# Patient Record
Sex: Female | Born: 1960 | Race: White
Health system: Northeastern US, Academic
[De-identification: ages and names within clinical notes are randomized; demographics above are authoritative.]

## PROBLEM LIST (undated history)

## (undated) DIAGNOSIS — R569 Unspecified convulsions: Secondary | ICD-10-CM

## (undated) DIAGNOSIS — G4730 Sleep apnea, unspecified: Secondary | ICD-10-CM

## (undated) DIAGNOSIS — B20 Human immunodeficiency virus [HIV] disease: Secondary | ICD-10-CM

## (undated) DIAGNOSIS — G2581 Restless legs syndrome: Secondary | ICD-10-CM

## (undated) DIAGNOSIS — G629 Polyneuropathy, unspecified: Secondary | ICD-10-CM

## (undated) DIAGNOSIS — G47419 Narcolepsy without cataplexy: Secondary | ICD-10-CM

## (undated) DIAGNOSIS — E785 Hyperlipidemia, unspecified: Secondary | ICD-10-CM

## (undated) DIAGNOSIS — Z21 Asymptomatic human immunodeficiency virus [HIV] infection status: Secondary | ICD-10-CM

## (undated) DIAGNOSIS — I10 Essential (primary) hypertension: Secondary | ICD-10-CM

## (undated) DIAGNOSIS — F319 Bipolar disorder, unspecified: Secondary | ICD-10-CM

## (undated) HISTORY — DX: Human immunodeficiency virus (HIV) disease: B20

## (undated) HISTORY — DX: Asymptomatic human immunodeficiency virus (hiv) infection status: Z21

## (undated) HISTORY — DX: Bipolar disorder, unspecified: F31.9

## (undated) HISTORY — DX: Hyperlipidemia, unspecified: E78.5

## (undated) HISTORY — PX: SHOULDER SURGERY: SHX246

## (undated) HISTORY — DX: Essential (primary) hypertension: I10

---

## 1999-08-14 ENCOUNTER — Emergency Department (HOSPITAL_COMMUNITY): Admission: EM | Admit: 1999-08-14 | Discharge: 1999-08-14 | Payer: Self-pay | Admitting: *Deleted

## 2000-01-18 ENCOUNTER — Other Ambulatory Visit: Admission: RE | Admit: 2000-01-18 | Discharge: 2000-01-18 | Payer: Self-pay | Admitting: Family Medicine

## 2003-08-21 ENCOUNTER — Other Ambulatory Visit: Admission: RE | Admit: 2003-08-21 | Discharge: 2003-08-21 | Payer: Self-pay | Admitting: Family Medicine

## 2003-09-01 ENCOUNTER — Ambulatory Visit (HOSPITAL_BASED_OUTPATIENT_CLINIC_OR_DEPARTMENT_OTHER): Admission: RE | Admit: 2003-09-01 | Discharge: 2003-09-01 | Payer: Self-pay | Admitting: Family Medicine

## 2004-02-05 ENCOUNTER — Ambulatory Visit: Payer: Self-pay | Admitting: Family Medicine

## 2004-02-16 ENCOUNTER — Ambulatory Visit: Payer: Self-pay | Admitting: Family Medicine

## 2004-04-05 ENCOUNTER — Emergency Department (HOSPITAL_COMMUNITY): Admission: EM | Admit: 2004-04-05 | Discharge: 2004-04-05 | Payer: Self-pay | Admitting: Emergency Medicine

## 2004-07-28 ENCOUNTER — Ambulatory Visit: Payer: Self-pay | Admitting: Family Medicine

## 2005-06-16 ENCOUNTER — Ambulatory Visit: Payer: Self-pay | Admitting: Family Medicine

## 2005-07-20 ENCOUNTER — Ambulatory Visit: Payer: Self-pay | Admitting: Family Medicine

## 2005-12-21 ENCOUNTER — Ambulatory Visit: Payer: Self-pay | Admitting: Family Medicine

## 2006-05-25 ENCOUNTER — Encounter: Payer: Self-pay | Admitting: Family Medicine

## 2006-05-30 ENCOUNTER — Ambulatory Visit: Payer: Self-pay | Admitting: Family Medicine

## 2006-06-29 ENCOUNTER — Ambulatory Visit: Payer: Self-pay | Admitting: Family Medicine

## 2006-07-09 ENCOUNTER — Ambulatory Visit: Payer: Self-pay | Admitting: Family Medicine

## 2006-07-09 LAB — CONVERTED CEMR LAB
AST: 24 units/L (ref 0–37)
Albumin: 3.5 g/dL (ref 3.5–5.2)
Basophils Relative: 0.7 % (ref 0.0–1.0)
Bilirubin, Direct: 0.1 mg/dL (ref 0.0–0.3)
Chloride: 104 meq/L (ref 96–112)
Creatinine, Ser: 0.7 mg/dL (ref 0.4–1.2)
Eosinophils Absolute: 0.1 10*3/uL (ref 0.0–0.6)
Eosinophils Relative: 1.3 % (ref 0.0–5.0)
Folate: 8.6 ng/mL
Glucose, Bld: 105 mg/dL — ABNORMAL HIGH (ref 70–99)
HDL: 38.3 mg/dL — ABNORMAL LOW (ref >39.0)
Hemoglobin: 13.4 g/dL (ref 12.0–15.0)
Lymphocytes Relative: 22.4 % (ref 12.0–46.0)
MCV: 86.9 fL (ref 78.0–100.0)
Monocytes Absolute: 0.4 10*3/uL (ref 0.2–0.7)
Monocytes Relative: 9.4 % (ref 3.0–11.0)
Neutro Abs: 2.6 10*3/uL (ref 1.4–7.7)
Platelets: 185 10*3/uL (ref 150–400)
Potassium: 4 meq/L (ref 3.5–5.1)
Sodium: 137 meq/L (ref 135–145)
TSH: 2.36 microintl units/mL (ref 0.35–5.50)
Total Bilirubin: 0.7 mg/dL (ref 0.3–1.2)
Total Protein: 7.3 g/dL (ref 6.0–8.3)
Triglycerides: 109 mg/dL (ref 0–149)

## 2006-07-20 ENCOUNTER — Ambulatory Visit: Payer: Self-pay | Admitting: Family Medicine

## 2006-07-24 DIAGNOSIS — G2589 Other specified extrapyramidal and movement disorders: Secondary | ICD-10-CM | POA: Insufficient documentation

## 2006-07-24 DIAGNOSIS — J45909 Unspecified asthma, uncomplicated: Secondary | ICD-10-CM | POA: Insufficient documentation

## 2006-07-24 DIAGNOSIS — F329 Major depressive disorder, single episode, unspecified: Secondary | ICD-10-CM | POA: Insufficient documentation

## 2006-07-27 ENCOUNTER — Ambulatory Visit: Payer: Self-pay | Admitting: Family Medicine

## 2006-08-02 ENCOUNTER — Ambulatory Visit: Payer: Self-pay | Admitting: Family Medicine

## 2006-08-14 ENCOUNTER — Ambulatory Visit: Payer: Self-pay | Admitting: Family Medicine

## 2006-08-14 LAB — CONVERTED CEMR LAB
Basophils Relative: 0.3 % (ref 0.0–1.0)
Eosinophils Relative: 1.3 % (ref 0.0–5.0)
Monocytes Relative: 9.1 % (ref 3.0–11.0)
Neutro Abs: 1.9 10*3/uL (ref 1.4–7.7)
Platelets: 190 10*3/uL (ref 150–400)
RBC: 4.53 M/uL (ref 3.87–5.11)
RDW: 12.3 % (ref 11.5–14.6)
WBC: 3.2 10*3/uL — ABNORMAL LOW (ref 4.5–10.5)

## 2006-08-16 ENCOUNTER — Encounter: Payer: Self-pay | Admitting: Family Medicine

## 2006-09-06 ENCOUNTER — Encounter: Payer: Self-pay | Admitting: Family Medicine

## 2006-09-10 ENCOUNTER — Telehealth (INDEPENDENT_AMBULATORY_CARE_PROVIDER_SITE_OTHER): Payer: Self-pay | Admitting: *Deleted

## 2006-09-10 DIAGNOSIS — IMO0001 Reserved for inherently not codable concepts without codable children: Secondary | ICD-10-CM | POA: Insufficient documentation

## 2006-10-03 ENCOUNTER — Encounter: Payer: Self-pay | Admitting: Family Medicine

## 2006-10-12 ENCOUNTER — Telehealth: Payer: Self-pay | Admitting: Family Medicine

## 2006-10-16 ENCOUNTER — Ambulatory Visit: Payer: Self-pay | Admitting: Family Medicine

## 2006-10-19 LAB — CONVERTED CEMR LAB
Eosinophils Absolute: 0 10*3/uL (ref 0.0–0.6)
Eosinophils Relative: 0.6 % (ref 0.0–5.0)
HCT: 38.6 % (ref 36.0–46.0)
Hemoglobin: 13.5 g/dL (ref 12.0–15.0)
Lymphocytes Relative: 13.6 % (ref 12.0–46.0)
MCHC: 35 g/dL (ref 30.0–36.0)
MCV: 86.7 fL (ref 78.0–100.0)
Monocytes Absolute: 0.2 10*3/uL (ref 0.2–0.7)
Neutrophils Relative %: 82.5 % — ABNORMAL HIGH (ref 43.0–77.0)
RBC: 4.45 M/uL (ref 3.87–5.11)
WBC: 5.8 10*3/uL (ref 4.5–10.5)

## 2006-11-26 ENCOUNTER — Encounter: Payer: Self-pay | Admitting: Family Medicine

## 2006-12-05 ENCOUNTER — Telehealth (INDEPENDENT_AMBULATORY_CARE_PROVIDER_SITE_OTHER): Payer: Self-pay | Admitting: *Deleted

## 2006-12-13 ENCOUNTER — Encounter (INDEPENDENT_AMBULATORY_CARE_PROVIDER_SITE_OTHER): Payer: Self-pay | Admitting: *Deleted

## 2006-12-13 ENCOUNTER — Encounter: Payer: Self-pay | Admitting: Internal Medicine

## 2006-12-13 ENCOUNTER — Other Ambulatory Visit: Admission: RE | Admit: 2006-12-13 | Discharge: 2006-12-13 | Payer: Self-pay | Admitting: Family Medicine

## 2006-12-13 ENCOUNTER — Encounter: Payer: Self-pay | Admitting: Family Medicine

## 2006-12-13 ENCOUNTER — Ambulatory Visit: Payer: Self-pay | Admitting: Family Medicine

## 2006-12-13 DIAGNOSIS — Z87891 Personal history of nicotine dependence: Secondary | ICD-10-CM | POA: Insufficient documentation

## 2006-12-13 DIAGNOSIS — D518 Other vitamin B12 deficiency anemias: Secondary | ICD-10-CM | POA: Insufficient documentation

## 2006-12-13 LAB — CONVERTED CEMR LAB
Pap Smear: NORMAL
Pap Smear: NORMAL

## 2007-01-16 ENCOUNTER — Ambulatory Visit: Payer: Self-pay | Admitting: Family Medicine

## 2007-01-17 ENCOUNTER — Ambulatory Visit: Payer: Self-pay | Admitting: Family Medicine

## 2007-01-17 LAB — CONVERTED CEMR LAB
Basophils Absolute: 0 10*3/uL (ref 0.0–0.1)
MCHC: 34.4 g/dL (ref 30.0–36.0)
Monocytes Absolute: 0.3 10*3/uL (ref 0.2–0.7)
Monocytes Relative: 8 % (ref 3.0–11.0)
Platelets: 219 10*3/uL (ref 150–400)
RBC: 4.35 M/uL (ref 3.87–5.11)
RDW: 13.6 % (ref 11.5–14.6)
Vitamin B-12: 315 pg/mL (ref 211–911)

## 2007-01-24 DIAGNOSIS — R75 Inconclusive laboratory evidence of human immunodeficiency virus [HIV]: Secondary | ICD-10-CM | POA: Insufficient documentation

## 2007-01-25 ENCOUNTER — Telehealth (INDEPENDENT_AMBULATORY_CARE_PROVIDER_SITE_OTHER): Payer: Self-pay | Admitting: *Deleted

## 2007-01-25 LAB — CONVERTED CEMR LAB
HIV-1 antibody: POSITIVE — AB
HIV-2 Ab: UNDETERMINED — AB

## 2007-02-25 ENCOUNTER — Encounter: Payer: Self-pay | Admitting: Internal Medicine

## 2007-04-11 ENCOUNTER — Ambulatory Visit: Payer: Self-pay | Admitting: Internal Medicine

## 2007-04-11 ENCOUNTER — Encounter: Admission: RE | Admit: 2007-04-11 | Discharge: 2007-04-11 | Payer: Self-pay | Admitting: Internal Medicine

## 2007-04-11 DIAGNOSIS — B20 Human immunodeficiency virus [HIV] disease: Secondary | ICD-10-CM | POA: Insufficient documentation

## 2007-04-11 LAB — CONVERTED CEMR LAB
AST: 18 units/L (ref 0–37)
BUN: 10 mg/dL (ref 6–23)
Basophils Relative: 1 % (ref 0–1)
CO2: 24 meq/L (ref 19–32)
Calcium: 8.8 mg/dL (ref 8.4–10.5)
Chlamydia, Swab/Urine, PCR: NEGATIVE
Chloride: 104 meq/L (ref 96–112)
Cholesterol: 209 mg/dL — ABNORMAL HIGH (ref 0–200)
Creatinine, Ser: 0.74 mg/dL (ref 0.40–1.20)
HCV Ab: NEGATIVE
HDL: 48 mg/dL (ref >39)
HIV-1 antibody: POSITIVE — AB
HIV-2 Ab: UNDETERMINED — AB
HIV: REACTIVE
Hemoglobin, Urine: NEGATIVE
Hemoglobin: 14.4 g/dL (ref 12.0–15.0)
Ketones, ur: NEGATIVE mg/dL
MCHC: 33.8 g/dL (ref 30.0–36.0)
Monocytes Absolute: 0.3 10*3/uL (ref 0.1–1.0)
Monocytes Relative: 7 % (ref 3–12)
Neutro Abs: 3.4 10*3/uL (ref 1.7–7.7)
Nitrite: NEGATIVE
RBC: 4.77 M/uL (ref 3.87–5.11)
Total CHOL/HDL Ratio: 4.4
Triglycerides: 90 mg/dL (ref <150)
Urobilinogen, UA: 0.2 (ref 0.0–1.0)

## 2007-04-12 ENCOUNTER — Encounter: Payer: Self-pay | Admitting: Internal Medicine

## 2007-04-19 DIAGNOSIS — M779 Enthesopathy, unspecified: Secondary | ICD-10-CM | POA: Insufficient documentation

## 2007-04-26 ENCOUNTER — Ambulatory Visit: Payer: Self-pay | Admitting: Internal Medicine

## 2007-04-26 ENCOUNTER — Encounter: Admission: RE | Admit: 2007-04-26 | Discharge: 2007-04-26 | Payer: Self-pay | Admitting: Internal Medicine

## 2007-04-26 ENCOUNTER — Encounter: Payer: Self-pay | Admitting: Licensed Clinical Social Worker

## 2007-05-08 ENCOUNTER — Encounter (INDEPENDENT_AMBULATORY_CARE_PROVIDER_SITE_OTHER): Payer: Self-pay | Admitting: *Deleted

## 2007-05-10 ENCOUNTER — Ambulatory Visit: Payer: Self-pay | Admitting: Internal Medicine

## 2007-05-20 ENCOUNTER — Encounter (INDEPENDENT_AMBULATORY_CARE_PROVIDER_SITE_OTHER): Payer: Self-pay | Admitting: *Deleted

## 2007-05-29 ENCOUNTER — Telehealth: Payer: Self-pay | Admitting: Internal Medicine

## 2007-06-12 ENCOUNTER — Encounter: Admission: RE | Admit: 2007-06-12 | Discharge: 2007-06-12 | Payer: Self-pay | Admitting: Internal Medicine

## 2007-06-12 ENCOUNTER — Ambulatory Visit: Payer: Self-pay | Admitting: Internal Medicine

## 2007-06-12 LAB — CONVERTED CEMR LAB
ALT: 13 units/L (ref 0–35)
BUN: 12 mg/dL (ref 6–23)
Basophils Absolute: 0 10*3/uL (ref 0.0–0.1)
CO2: 23 meq/L (ref 19–32)
Calcium: 8.7 mg/dL (ref 8.4–10.5)
Creatinine, Ser: 0.72 mg/dL (ref 0.40–1.20)
Eosinophils Relative: 1 % (ref 0–5)
HCT: 39.7 % (ref 36.0–46.0)
HIV 1 RNA Quant: 85 copies/mL — ABNORMAL HIGH (ref <50)
HIV-1 RNA Quant, Log: 1.93 — ABNORMAL HIGH (ref <1.70)
Hemoglobin: 13.2 g/dL (ref 12.0–15.0)
Lymphocytes Relative: 18 % (ref 12–46)
MCHC: 33.2 g/dL (ref 30.0–36.0)
Monocytes Absolute: 0.3 10*3/uL (ref 0.1–1.0)
Monocytes Relative: 5 % (ref 3–12)
RBC: 4.44 M/uL (ref 3.87–5.11)
RDW: 13 % (ref 11.5–15.5)
Total Bilirubin: 2.3 mg/dL — ABNORMAL HIGH (ref 0.3–1.2)

## 2007-06-25 ENCOUNTER — Telehealth: Payer: Self-pay | Admitting: Internal Medicine

## 2007-06-28 ENCOUNTER — Ambulatory Visit: Payer: Self-pay | Admitting: Internal Medicine

## 2007-06-28 DIAGNOSIS — D4959 Neoplasm of unspecified behavior of other genitourinary organ: Secondary | ICD-10-CM | POA: Insufficient documentation

## 2007-07-17 ENCOUNTER — Ambulatory Visit (HOSPITAL_COMMUNITY): Admission: RE | Admit: 2007-07-17 | Discharge: 2007-07-17 | Payer: Self-pay | Admitting: Internal Medicine

## 2007-07-17 ENCOUNTER — Encounter: Payer: Self-pay | Admitting: Internal Medicine

## 2007-07-19 ENCOUNTER — Encounter: Payer: Self-pay | Admitting: Family Medicine

## 2007-07-19 ENCOUNTER — Telehealth (INDEPENDENT_AMBULATORY_CARE_PROVIDER_SITE_OTHER): Payer: Self-pay | Admitting: *Deleted

## 2007-07-22 ENCOUNTER — Encounter: Payer: Self-pay | Admitting: Internal Medicine

## 2007-07-31 ENCOUNTER — Encounter: Admission: RE | Admit: 2007-07-31 | Discharge: 2007-07-31 | Payer: Self-pay | Admitting: Internal Medicine

## 2007-07-31 ENCOUNTER — Ambulatory Visit: Payer: Self-pay | Admitting: Internal Medicine

## 2007-07-31 LAB — CONVERTED CEMR LAB
AST: 29 units/L (ref 0–37)
Albumin: 4 g/dL (ref 3.5–5.2)
BUN: 12 mg/dL (ref 6–23)
CO2: 24 meq/L (ref 19–32)
Calcium: 8.8 mg/dL (ref 8.4–10.5)
Chloride: 102 meq/L (ref 96–112)
Creatinine, Ser: 0.84 mg/dL (ref 0.40–1.20)
Glucose, Bld: 77 mg/dL (ref 70–99)
HIV 1 RNA Quant: <50 copies/mL (ref <50)
Hemoglobin: 13.5 g/dL (ref 12.0–15.0)
Lymphocytes Relative: 21 % (ref 12–46)
Lymphs Abs: 1 10*3/uL (ref 0.7–4.0)
MCHC: 35.2 g/dL (ref 30.0–36.0)
Monocytes Absolute: 0.3 10*3/uL (ref 0.1–1.0)
Monocytes Relative: 7 % (ref 3–12)
Neutro Abs: 3.5 10*3/uL (ref 1.7–7.7)
Neutrophils Relative %: 71 % (ref 43–77)
Potassium: 4.1 meq/L (ref 3.5–5.3)
RBC: 4.35 M/uL (ref 3.87–5.11)
WBC: 4.9 10*3/uL (ref 4.0–10.5)

## 2007-08-14 ENCOUNTER — Ambulatory Visit: Payer: Self-pay | Admitting: Internal Medicine

## 2007-08-14 DIAGNOSIS — R030 Elevated blood-pressure reading, without diagnosis of hypertension: Secondary | ICD-10-CM | POA: Insufficient documentation

## 2007-08-15 ENCOUNTER — Encounter: Payer: Self-pay | Admitting: Licensed Clinical Social Worker

## 2007-08-21 ENCOUNTER — Ambulatory Visit: Payer: Self-pay | Admitting: *Deleted

## 2007-08-22 ENCOUNTER — Telehealth (INDEPENDENT_AMBULATORY_CARE_PROVIDER_SITE_OTHER): Payer: Self-pay | Admitting: *Deleted

## 2007-09-04 ENCOUNTER — Encounter (INDEPENDENT_AMBULATORY_CARE_PROVIDER_SITE_OTHER): Payer: Self-pay | Admitting: *Deleted

## 2007-09-04 ENCOUNTER — Encounter: Payer: Self-pay | Admitting: Obstetrics and Gynecology

## 2007-09-04 ENCOUNTER — Ambulatory Visit: Payer: Self-pay | Admitting: Obstetrics and Gynecology

## 2007-09-23 ENCOUNTER — Telehealth (INDEPENDENT_AMBULATORY_CARE_PROVIDER_SITE_OTHER): Payer: Self-pay | Admitting: *Deleted

## 2007-09-24 ENCOUNTER — Encounter: Payer: Self-pay | Admitting: Internal Medicine

## 2007-10-22 ENCOUNTER — Telehealth (INDEPENDENT_AMBULATORY_CARE_PROVIDER_SITE_OTHER): Payer: Self-pay | Admitting: *Deleted

## 2007-11-13 ENCOUNTER — Ambulatory Visit: Payer: Self-pay | Admitting: Internal Medicine

## 2007-11-13 ENCOUNTER — Encounter: Admission: RE | Admit: 2007-11-13 | Discharge: 2007-11-13 | Payer: Self-pay | Admitting: Internal Medicine

## 2007-11-14 LAB — CONVERTED CEMR LAB
AST: 25 units/L (ref 0–37)
Albumin: 4.6 g/dL (ref 3.5–5.2)
Alkaline Phosphatase: 111 units/L (ref 39–117)
Basophils Relative: 1 % (ref 0–1)
Calcium: 9.8 mg/dL (ref 8.4–10.5)
Chloride: 106 meq/L (ref 96–112)
Eosinophils Absolute: 0.1 10*3/uL (ref 0.0–0.7)
Lymphs Abs: 1 10*3/uL (ref 0.7–4.0)
MCHC: 33.7 g/dL (ref 30.0–36.0)
MCV: 95.9 fL (ref 78.0–100.0)
Monocytes Relative: 9 % (ref 3–12)
Neutro Abs: 3.6 10*3/uL (ref 1.7–7.7)
Neutrophils Relative %: 70 % (ref 43–77)
Platelets: 219 10*3/uL (ref 150–400)
Potassium: 5.7 meq/L — ABNORMAL HIGH (ref 3.5–5.3)
RBC: 4.58 M/uL (ref 3.87–5.11)
Sodium: 140 meq/L (ref 135–145)
Total Protein: 8 g/dL (ref 6.0–8.3)
WBC: 5.2 10*3/uL (ref 4.0–10.5)

## 2007-11-19 ENCOUNTER — Telehealth (INDEPENDENT_AMBULATORY_CARE_PROVIDER_SITE_OTHER): Payer: Self-pay | Admitting: *Deleted

## 2007-11-22 ENCOUNTER — Encounter (INDEPENDENT_AMBULATORY_CARE_PROVIDER_SITE_OTHER): Payer: Self-pay | Admitting: *Deleted

## 2007-11-22 ENCOUNTER — Telehealth (INDEPENDENT_AMBULATORY_CARE_PROVIDER_SITE_OTHER): Payer: Self-pay | Admitting: *Deleted

## 2007-11-22 ENCOUNTER — Ambulatory Visit: Payer: Self-pay | Admitting: Internal Medicine

## 2007-11-22 LAB — CONVERTED CEMR LAB
BUN: 13 mg/dL (ref 6–23)
CO2: 26 meq/L (ref 19–32)
Calcium: 9.5 mg/dL (ref 8.4–10.5)
Chloride: 106 meq/L (ref 96–112)
Creatinine, Ser: 0.75 mg/dL (ref 0.40–1.20)
Glucose, Bld: 106 mg/dL — ABNORMAL HIGH (ref 70–99)

## 2007-11-27 ENCOUNTER — Ambulatory Visit: Payer: Self-pay | Admitting: Internal Medicine

## 2007-12-17 ENCOUNTER — Telehealth (INDEPENDENT_AMBULATORY_CARE_PROVIDER_SITE_OTHER): Payer: Self-pay | Admitting: *Deleted

## 2007-12-27 ENCOUNTER — Ambulatory Visit: Payer: Self-pay | Admitting: Infectious Diseases

## 2007-12-27 ENCOUNTER — Ambulatory Visit: Payer: Self-pay | Admitting: Internal Medicine

## 2007-12-27 DIAGNOSIS — K047 Periapical abscess without sinus: Secondary | ICD-10-CM | POA: Insufficient documentation

## 2008-01-13 ENCOUNTER — Emergency Department (HOSPITAL_COMMUNITY): Admission: EM | Admit: 2008-01-13 | Discharge: 2008-01-13 | Payer: Self-pay | Admitting: Emergency Medicine

## 2008-03-06 ENCOUNTER — Ambulatory Visit: Payer: Self-pay | Admitting: Internal Medicine

## 2008-03-06 LAB — CONVERTED CEMR LAB
ALT: 16 units/L (ref 0–35)
AST: 22 units/L (ref 0–37)
Basophils Absolute: 0.1 10*3/uL (ref 0.0–0.1)
Basophils Relative: 2 % — ABNORMAL HIGH (ref 0–1)
Calcium: 9.6 mg/dL (ref 8.4–10.5)
Chloride: 104 meq/L (ref 96–112)
Creatinine, Ser: 0.87 mg/dL (ref 0.40–1.20)
MCHC: 32.8 g/dL (ref 30.0–36.0)
Monocytes Absolute: 0.3 10*3/uL (ref 0.1–1.0)
Neutro Abs: 2.8 10*3/uL (ref 1.7–7.7)
Neutrophils Relative %: 69 % (ref 43–77)
Platelets: 268 10*3/uL (ref 150–400)
Potassium: 3.7 meq/L (ref 3.5–5.3)
RDW: 13.4 % (ref 11.5–15.5)
Sodium: 139 meq/L (ref 135–145)
Total Protein: 7.7 g/dL (ref 6.0–8.3)

## 2008-03-18 ENCOUNTER — Ambulatory Visit: Payer: Self-pay | Admitting: Internal Medicine

## 2008-03-18 DIAGNOSIS — D239 Other benign neoplasm of skin, unspecified: Secondary | ICD-10-CM | POA: Insufficient documentation

## 2008-06-16 ENCOUNTER — Ambulatory Visit: Payer: Self-pay | Admitting: Internal Medicine

## 2008-06-16 LAB — CONVERTED CEMR LAB
BUN: 12 mg/dL (ref 6–23)
CO2: 24 meq/L (ref 19–32)
Calcium: 8.9 mg/dL (ref 8.4–10.5)
Chloride: 101 meq/L (ref 96–112)
Creatinine, Ser: 0.74 mg/dL (ref 0.40–1.20)
Eosinophils Absolute: 0.1 10*3/uL (ref 0.0–0.7)
Eosinophils Relative: 1 % (ref 0–5)
HCT: 40.6 % (ref 36.0–46.0)
HIV 1 RNA Quant: <48 copies/mL (ref <48)
HIV-1 RNA Quant, Log: <1.68 (ref <1.68)
Lymphs Abs: 0.7 10*3/uL (ref 0.7–4.0)
MCHC: 33.3 g/dL (ref 30.0–36.0)
MCV: 92.1 fL (ref 78.0–100.0)
Monocytes Relative: 12 % (ref 3–12)
Platelets: 237 10*3/uL (ref 150–400)
WBC: 6.3 10*3/uL (ref 4.0–10.5)

## 2008-06-22 ENCOUNTER — Encounter: Payer: Self-pay | Admitting: Family Medicine

## 2008-06-30 ENCOUNTER — Ambulatory Visit: Payer: Self-pay | Admitting: Internal Medicine

## 2008-07-16 ENCOUNTER — Telehealth: Payer: Self-pay | Admitting: Internal Medicine

## 2008-07-29 ENCOUNTER — Emergency Department (HOSPITAL_COMMUNITY): Admission: EM | Admit: 2008-07-29 | Discharge: 2008-07-30 | Payer: Self-pay | Admitting: Emergency Medicine

## 2008-08-10 ENCOUNTER — Encounter: Payer: Self-pay | Admitting: Family Medicine

## 2008-09-09 ENCOUNTER — Telehealth (INDEPENDENT_AMBULATORY_CARE_PROVIDER_SITE_OTHER): Payer: Self-pay | Admitting: *Deleted

## 2009-02-12 ENCOUNTER — Encounter: Payer: Self-pay | Admitting: Family Medicine

## 2009-02-23 ENCOUNTER — Telehealth (INDEPENDENT_AMBULATORY_CARE_PROVIDER_SITE_OTHER): Payer: Self-pay | Admitting: *Deleted

## 2009-02-24 ENCOUNTER — Ambulatory Visit: Payer: Self-pay | Admitting: Diagnostic Radiology

## 2009-02-24 ENCOUNTER — Ambulatory Visit (HOSPITAL_BASED_OUTPATIENT_CLINIC_OR_DEPARTMENT_OTHER): Admission: RE | Admit: 2009-02-24 | Discharge: 2009-02-24 | Payer: Self-pay | Admitting: Family Medicine

## 2009-02-24 ENCOUNTER — Ambulatory Visit: Payer: Self-pay | Admitting: Family

## 2009-02-24 ENCOUNTER — Telehealth (INDEPENDENT_AMBULATORY_CARE_PROVIDER_SITE_OTHER): Payer: Self-pay | Admitting: *Deleted

## 2009-02-24 DIAGNOSIS — J40 Bronchitis, not specified as acute or chronic: Secondary | ICD-10-CM | POA: Insufficient documentation

## 2009-03-03 ENCOUNTER — Ambulatory Visit: Payer: Self-pay | Admitting: Family Medicine

## 2009-03-03 DIAGNOSIS — G9050 Complex regional pain syndrome I, unspecified: Secondary | ICD-10-CM | POA: Insufficient documentation

## 2009-03-03 DIAGNOSIS — G2581 Restless legs syndrome: Secondary | ICD-10-CM | POA: Insufficient documentation

## 2009-03-05 ENCOUNTER — Encounter (INDEPENDENT_AMBULATORY_CARE_PROVIDER_SITE_OTHER): Payer: Self-pay | Admitting: *Deleted

## 2009-03-05 LAB — CONVERTED CEMR LAB: Vit D, 25-Hydroxy: 50 ng/mL (ref 30–89)

## 2009-03-10 ENCOUNTER — Ambulatory Visit: Payer: Self-pay | Admitting: Family Medicine

## 2009-03-17 ENCOUNTER — Ambulatory Visit: Payer: Self-pay | Admitting: Family Medicine

## 2009-03-24 ENCOUNTER — Ambulatory Visit: Payer: Self-pay | Admitting: Family Medicine

## 2009-04-16 ENCOUNTER — Encounter (INDEPENDENT_AMBULATORY_CARE_PROVIDER_SITE_OTHER): Payer: Self-pay | Admitting: *Deleted

## 2009-04-19 ENCOUNTER — Encounter: Payer: Self-pay | Admitting: Internal Medicine

## 2009-04-26 ENCOUNTER — Ambulatory Visit: Payer: Self-pay | Admitting: Family Medicine

## 2009-05-12 ENCOUNTER — Telehealth: Payer: Self-pay | Admitting: Internal Medicine

## 2009-05-13 ENCOUNTER — Ambulatory Visit: Payer: Self-pay | Admitting: Internal Medicine

## 2009-05-14 ENCOUNTER — Encounter: Payer: Self-pay | Admitting: Internal Medicine

## 2009-05-14 LAB — CONVERTED CEMR LAB
ALT: 13 units/L (ref 0–35)
AST: 19 units/L (ref 0–37)
Calcium: 8.9 mg/dL (ref 8.4–10.5)
Chloride: 103 meq/L (ref 96–112)
Creatinine, Ser: 0.88 mg/dL (ref 0.40–1.20)
Eosinophils Absolute: 0.2 10*3/uL (ref 0.0–0.7)
Lymphocytes Relative: 16 % (ref 12–46)
Lymphs Abs: 0.9 10*3/uL (ref 0.7–4.0)
Neutrophils Relative %: 72 % (ref 43–77)
Platelets: 234 10*3/uL (ref 150–400)
Potassium: 4 meq/L (ref 3.5–5.3)
WBC: 5.8 10*3/uL (ref 4.0–10.5)

## 2009-05-22 ENCOUNTER — Encounter: Payer: Self-pay | Admitting: Internal Medicine

## 2009-06-09 ENCOUNTER — Ambulatory Visit: Payer: Self-pay | Admitting: Internal Medicine

## 2009-06-09 DIAGNOSIS — R413 Other amnesia: Secondary | ICD-10-CM | POA: Insufficient documentation

## 2009-06-11 ENCOUNTER — Ambulatory Visit: Payer: Self-pay | Admitting: Diagnostic Radiology

## 2009-06-11 ENCOUNTER — Ambulatory Visit: Payer: Self-pay | Admitting: Internal Medicine

## 2009-06-11 ENCOUNTER — Emergency Department (HOSPITAL_BASED_OUTPATIENT_CLINIC_OR_DEPARTMENT_OTHER): Admission: EM | Admit: 2009-06-11 | Discharge: 2009-06-11 | Payer: Self-pay | Admitting: Emergency Medicine

## 2009-06-16 ENCOUNTER — Encounter: Payer: Self-pay | Admitting: Family Medicine

## 2009-10-15 ENCOUNTER — Ambulatory Visit: Payer: Self-pay | Admitting: Family Medicine

## 2009-10-15 DIAGNOSIS — F411 Generalized anxiety disorder: Secondary | ICD-10-CM | POA: Insufficient documentation

## 2009-10-25 ENCOUNTER — Ambulatory Visit: Payer: Self-pay | Admitting: Family Medicine

## 2009-10-25 DIAGNOSIS — B070 Plantar wart: Secondary | ICD-10-CM | POA: Insufficient documentation

## 2009-11-10 ENCOUNTER — Telehealth: Payer: Self-pay | Admitting: Family Medicine

## 2009-11-15 ENCOUNTER — Encounter: Payer: Self-pay | Admitting: Family Medicine

## 2009-12-13 ENCOUNTER — Telehealth: Payer: Self-pay | Admitting: Family Medicine

## 2009-12-14 ENCOUNTER — Telehealth (INDEPENDENT_AMBULATORY_CARE_PROVIDER_SITE_OTHER): Payer: Self-pay | Admitting: *Deleted

## 2009-12-15 ENCOUNTER — Telehealth (INDEPENDENT_AMBULATORY_CARE_PROVIDER_SITE_OTHER): Payer: Self-pay | Admitting: *Deleted

## 2009-12-17 ENCOUNTER — Encounter: Payer: Self-pay | Admitting: Internal Medicine

## 2009-12-20 ENCOUNTER — Ambulatory Visit: Payer: Self-pay | Admitting: Internal Medicine

## 2009-12-20 LAB — CONVERTED CEMR LAB
ALT: 10 units/L (ref 0–35)
AST: 21 units/L (ref 0–37)
Albumin: 4.3 g/dL (ref 3.5–5.2)
Alkaline Phosphatase: 71 units/L (ref 39–117)
BUN: 13 mg/dL (ref 6–23)
Basophils Absolute: 0 10*3/uL (ref 0.0–0.1)
Basophils Relative: 1 % (ref 0–1)
CO2: 27 meq/L (ref 19–32)
Calcium: 9.3 mg/dL (ref 8.4–10.5)
Chloride: 102 meq/L (ref 96–112)
Cholesterol: 201 mg/dL — ABNORMAL HIGH (ref 0–200)
Creatinine, Ser: 0.72 mg/dL (ref 0.40–1.20)
Eosinophils Absolute: 0.1 10*3/uL (ref 0.0–0.7)
Eosinophils Relative: 1 % (ref 0–5)
Glucose, Bld: 126 mg/dL — ABNORMAL HIGH (ref 70–99)
HCT: 43.8 % (ref 36.0–46.0)
HDL: 54 mg/dL (ref >39)
Hemoglobin: 14.2 g/dL (ref 12.0–15.0)
LDL Cholesterol: 134 mg/dL — ABNORMAL HIGH (ref 0–99)
Lymphocytes Relative: 18 % (ref 12–46)
Lymphs Abs: 0.9 10*3/uL (ref 0.7–4.0)
MCHC: 32.4 g/dL (ref 30.0–36.0)
MCV: 91.6 fL (ref 78.0–100.0)
Monocytes Absolute: 0.4 10*3/uL (ref 0.1–1.0)
Monocytes Relative: 8 % (ref 3–12)
Neutro Abs: 3.7 10*3/uL (ref 1.7–7.7)
Neutrophils Relative %: 72 % (ref 43–77)
Platelets: 189 10*3/uL (ref 150–400)
Potassium: 3.7 meq/L (ref 3.5–5.3)
RBC: 4.78 M/uL (ref 3.87–5.11)
RDW: 13 % (ref 11.5–15.5)
Sodium: 139 meq/L (ref 135–145)
Total Bilirubin: 0.3 mg/dL (ref 0.3–1.2)
Total CHOL/HDL Ratio: 3.7
Total Protein: 7.5 g/dL (ref 6.0–8.3)
Triglycerides: 67 mg/dL (ref <150)
VLDL: 13 mg/dL (ref 0–40)
WBC: 5.1 10*3/uL (ref 4.0–10.5)

## 2010-01-03 ENCOUNTER — Encounter: Payer: Self-pay | Admitting: Internal Medicine

## 2010-01-03 ENCOUNTER — Ambulatory Visit: Payer: Self-pay | Admitting: Internal Medicine

## 2010-01-03 ENCOUNTER — Telehealth: Payer: Self-pay | Admitting: Internal Medicine

## 2010-01-04 ENCOUNTER — Encounter: Payer: Self-pay | Admitting: Family Medicine

## 2010-01-04 ENCOUNTER — Ambulatory Visit: Payer: Self-pay | Admitting: Diagnostic Radiology

## 2010-01-04 ENCOUNTER — Emergency Department (HOSPITAL_BASED_OUTPATIENT_CLINIC_OR_DEPARTMENT_OTHER): Admission: EM | Admit: 2010-01-04 | Discharge: 2010-01-04 | Payer: Self-pay | Admitting: Emergency Medicine

## 2010-01-07 ENCOUNTER — Ambulatory Visit: Payer: Self-pay | Admitting: Family Medicine

## 2010-01-07 DIAGNOSIS — J329 Chronic sinusitis, unspecified: Secondary | ICD-10-CM | POA: Insufficient documentation

## 2010-01-10 DIAGNOSIS — I10 Essential (primary) hypertension: Secondary | ICD-10-CM | POA: Insufficient documentation

## 2010-01-12 ENCOUNTER — Encounter: Payer: Self-pay | Admitting: Family Medicine

## 2010-01-18 ENCOUNTER — Encounter: Payer: Self-pay | Admitting: Family Medicine

## 2010-01-19 ENCOUNTER — Telehealth (INDEPENDENT_AMBULATORY_CARE_PROVIDER_SITE_OTHER): Payer: Self-pay | Admitting: *Deleted

## 2010-01-21 ENCOUNTER — Ambulatory Visit: Payer: Self-pay | Admitting: Diagnostic Radiology

## 2010-01-21 ENCOUNTER — Ambulatory Visit (HOSPITAL_BASED_OUTPATIENT_CLINIC_OR_DEPARTMENT_OTHER): Admission: RE | Admit: 2010-01-21 | Discharge: 2010-01-21 | Payer: Self-pay | Admitting: Family Medicine

## 2010-01-21 ENCOUNTER — Ambulatory Visit: Payer: Self-pay | Admitting: Family Medicine

## 2010-01-21 DIAGNOSIS — M5410 Radiculopathy, site unspecified: Secondary | ICD-10-CM | POA: Insufficient documentation

## 2010-01-24 ENCOUNTER — Telehealth (INDEPENDENT_AMBULATORY_CARE_PROVIDER_SITE_OTHER): Payer: Self-pay | Admitting: *Deleted

## 2010-02-07 ENCOUNTER — Ambulatory Visit: Payer: Self-pay | Admitting: Internal Medicine

## 2010-02-07 ENCOUNTER — Encounter (INDEPENDENT_AMBULATORY_CARE_PROVIDER_SITE_OTHER): Payer: Self-pay | Admitting: *Deleted

## 2010-02-08 ENCOUNTER — Encounter: Payer: Self-pay | Admitting: Internal Medicine

## 2010-02-16 ENCOUNTER — Ambulatory Visit: Payer: Self-pay | Admitting: Internal Medicine

## 2010-02-16 LAB — CONVERTED CEMR LAB
CO2: 25 meq/L (ref 19–32)
Calcium: 8.8 mg/dL (ref 8.4–10.5)
Chloride: 103 meq/L (ref 96–112)
Creatinine, Ser: 0.85 mg/dL (ref 0.40–1.20)
Eosinophils Absolute: 0.1 10*3/uL (ref 0.0–0.7)
Glucose, Bld: 89 mg/dL (ref 70–99)
HCT: 41.8 % (ref 36.0–46.0)
HIV 1 RNA Quant: 48 copies/mL — ABNORMAL HIGH (ref <20)
HIV-1 RNA Quant, Log: 1.68 — ABNORMAL HIGH (ref <1.30)
Lymphocytes Relative: 20 % (ref 12–46)
Lymphs Abs: 0.9 10*3/uL (ref 0.7–4.0)
MCV: 93.5 fL (ref 78.0–100.0)
Monocytes Relative: 12 % (ref 3–12)
Neutrophils Relative %: 65 % (ref 43–77)
Platelets: 173 10*3/uL (ref 150–400)
RBC: 4.47 M/uL (ref 3.87–5.11)
Total Bilirubin: 0.7 mg/dL (ref 0.3–1.2)
Total Protein: 7.3 g/dL (ref 6.0–8.3)
WBC: 4.6 10*3/uL (ref 4.0–10.5)

## 2010-03-04 ENCOUNTER — Ambulatory Visit: Payer: Self-pay | Admitting: Internal Medicine

## 2010-03-04 DIAGNOSIS — J019 Acute sinusitis, unspecified: Secondary | ICD-10-CM | POA: Insufficient documentation

## 2010-03-10 ENCOUNTER — Emergency Department (HOSPITAL_BASED_OUTPATIENT_CLINIC_OR_DEPARTMENT_OTHER)
Admission: EM | Admit: 2010-03-10 | Discharge: 2010-03-10 | Payer: Self-pay | Source: Home / Self Care | Admitting: Emergency Medicine

## 2010-03-15 ENCOUNTER — Ambulatory Visit: Payer: Self-pay | Admitting: Family Medicine

## 2010-03-17 ENCOUNTER — Ambulatory Visit (HOSPITAL_COMMUNITY)
Admission: RE | Admit: 2010-03-17 | Discharge: 2010-03-17 | Payer: Self-pay | Source: Home / Self Care | Attending: Family Medicine | Admitting: Family Medicine

## 2010-03-18 DIAGNOSIS — R1909 Other intra-abdominal and pelvic swelling, mass and lump: Secondary | ICD-10-CM | POA: Insufficient documentation

## 2010-03-22 ENCOUNTER — Ambulatory Visit: Payer: Self-pay | Admitting: Family Medicine

## 2010-03-22 ENCOUNTER — Encounter
Admission: RE | Admit: 2010-03-22 | Discharge: 2010-03-22 | Payer: Self-pay | Source: Home / Self Care | Attending: Family Medicine | Admitting: Family Medicine

## 2010-03-22 DIAGNOSIS — S61209A Unspecified open wound of unspecified finger without damage to nail, initial encounter: Secondary | ICD-10-CM | POA: Insufficient documentation

## 2010-03-30 ENCOUNTER — Ambulatory Visit: Payer: Self-pay | Admitting: Internal Medicine

## 2010-03-30 ENCOUNTER — Telehealth: Payer: Self-pay | Admitting: Family Medicine

## 2010-03-30 DIAGNOSIS — F319 Bipolar disorder, unspecified: Secondary | ICD-10-CM | POA: Insufficient documentation

## 2010-03-31 ENCOUNTER — Telehealth: Payer: Self-pay | Admitting: Family Medicine

## 2010-04-01 ENCOUNTER — Telehealth: Payer: Self-pay | Admitting: Internal Medicine

## 2010-04-08 ENCOUNTER — Telehealth (INDEPENDENT_AMBULATORY_CARE_PROVIDER_SITE_OTHER): Payer: Self-pay | Admitting: *Deleted

## 2010-04-22 ENCOUNTER — Telehealth (INDEPENDENT_AMBULATORY_CARE_PROVIDER_SITE_OTHER): Payer: Self-pay | Admitting: *Deleted

## 2010-04-24 ENCOUNTER — Encounter: Payer: Self-pay | Admitting: Family Medicine

## 2010-04-25 ENCOUNTER — Ambulatory Visit
Admission: RE | Admit: 2010-04-25 | Discharge: 2010-04-25 | Payer: Self-pay | Source: Home / Self Care | Attending: Family Medicine | Admitting: Family Medicine

## 2010-04-29 ENCOUNTER — Telehealth (INDEPENDENT_AMBULATORY_CARE_PROVIDER_SITE_OTHER): Payer: Self-pay | Admitting: *Deleted

## 2010-04-29 ENCOUNTER — Encounter (INDEPENDENT_AMBULATORY_CARE_PROVIDER_SITE_OTHER): Payer: Self-pay | Admitting: *Deleted

## 2010-05-01 LAB — CONVERTED CEMR LAB
AST: 21 units/L (ref 0–37)
Basophils Relative: 0.2 % (ref 0.0–1.0)
Bilirubin, Direct: 0.1 mg/dL (ref 0.0–0.3)
Chloride: 103 meq/L (ref 96–112)
Cholesterol: 211 mg/dL (ref 0–200)
Creatinine, Ser: 0.7 mg/dL (ref 0.4–1.2)
Direct LDL: 163.1 mg/dL
Eosinophils Relative: 0.8 % (ref 0.0–5.0)
Glucose, Bld: 105 mg/dL — ABNORMAL HIGH (ref 70–99)
Hemoglobin: 13.1 g/dL (ref 12.0–15.0)
Monocytes Relative: 5.9 % (ref 3.0–11.0)
Platelets: 187 10*3/uL (ref 150–400)
RDW: 12.9 % (ref 11.5–14.6)
Sodium: 135 meq/L (ref 135–145)
Total Bilirubin: 0.5 mg/dL (ref 0.3–1.2)
Total CHOL/HDL Ratio: 5.2
Total Protein: 7.8 g/dL (ref 6.0–8.3)
VLDL: 11 mg/dL (ref 0–40)
Vitamin B-12: >1500 pg/mL — ABNORMAL HIGH (ref 211–911)
WBC: 5.2 10*3/uL (ref 4.5–10.5)

## 2010-05-02 ENCOUNTER — Encounter
Admission: RE | Admit: 2010-05-02 | Discharge: 2010-05-02 | Payer: Self-pay | Source: Home / Self Care | Attending: Family Medicine | Admitting: Family Medicine

## 2010-05-03 ENCOUNTER — Encounter: Payer: Self-pay | Admitting: Internal Medicine

## 2010-05-03 ENCOUNTER — Ambulatory Visit
Admission: RE | Admit: 2010-05-03 | Discharge: 2010-05-03 | Payer: Self-pay | Source: Home / Self Care | Attending: Internal Medicine | Admitting: Internal Medicine

## 2010-05-03 NOTE — Progress Notes (Signed)
Summary: Needs office visit  Phone Note Outgoing Call   Call placed by: Annice Pih Summary of Call: Called pt. and left voicemail that she is overdue for lab and office visit with Dr. Philipp Deputy Initial call taken by: Wendall Mola CMA Duncan Dull),  December 14, 2009 12:53 PM

## 2010-05-03 NOTE — Miscellaneous (Signed)
Summary: Orders Update - labs  Clinical Lists Changes  Problems: Added new problem of ENCOUNTER FOR LONG-TERM USE OF OTHER MEDICATIONS (ICD-V58.69) Orders: Added new Test order of T-CBC w/Diff [phone]) - Signed Added new Test order of T-CD4SP [facility]) (CD4SP) - Signed Added new Test order of T-Comprehensive Metabolic Panel [phone]) - Signed Added new Test order of T-HIV Viral Load [phone]) - Signed Added new Test order of T-RPR (Syphilis) [phone]) - Signed Added new Test order of T-Lipid Profile ([id number]) - Signed     Process Orders Check Orders Results:     [facility]: ABN not required for this insurance Order queued for requisitioning for [facility]: December 17, 2009 2:44 PM  Tests Sent for requisitioning (December 17, 2009 2:44 PM):     12/20/2009: [facility] -- T-CBC w/Diff [[id number]] (signed)     12/20/2009: [facility] -- T-Comprehensive Metabolic Panel [[id number]] (signed)     12/20/2009: [facility] -- T-HIV Viral Load [phone] (signed)     12/20/2009: [facility] -- T-RPR (Syphilis) [id number] (signed)     12/20/2009: [facility] -- T-Lipid Profile [id number] (signed)

## 2010-05-03 NOTE — Miscellaneous (Signed)
Summary: lyrica approved thru 07/07/09  Clinical Lists Changes found old copies of medicaid prior auth for lyrica. it was documented as approved back in 4/10. no documentation of new approval for this year. new form done & will fax after md signs. she is still on it per the med list in EMR.Golden Circle RN  January 12, 2010 3:31 [facility]  - Faxed prepared form to Dr. Laury Axon at [facility] today for signature.  Ask [facility] nursing staff to fax to Medicaid for pt. Jennet Maduro RN  January 17, 2010 11:05 AM  Appended Document: lyrica approved thru 07/07/09 approved 01-12-10 to 01-12-11, approval letter scan to chart

## 2010-05-03 NOTE — Assessment & Plan Note (Signed)
Summary: b12 shot/kdc  Nurse Visit   Allergies: No Known Drug Allergies  Medication Administration  Injection # 1:    Medication: Vit B12 1000 mcg    Diagnosis: ANEMIA, VITAMIN B12 DEFICIENCY (ICD-281.1)    Route: IM    Site: R deltoid    Exp Date: 01/2011    Lot #: [id number]    Mfr: [employer] Regent    Patient tolerated injection without complications    Given by: Floydene Flock (April 26, 2009 11:18 AM)  Orders Added: 1)  Admin of Therapeutic Inj  intramuscular or subcutaneous [96372] 2)  Vit B12 1000 mcg [J3420]   Medication Administration  Injection # 1:    Medication: Vit B12 1000 mcg    Diagnosis: ANEMIA, VITAMIN B12 DEFICIENCY (ICD-281.1)    Route: IM    Site: R deltoid    Exp Date: 01/2011    Lot #: [id number]    Mfr: American Regent    Patient tolerated injection without complications    Given by: Floydene Flock (April 26, 2009 11:18 AM)  Orders Added: 1)  Admin of Therapeutic Inj  intramuscular or subcutaneous [96372] 2)  Vit B12 1000 mcg [J3420]

## 2010-05-03 NOTE — Miscellaneous (Signed)
Summary: RW Update  Clinical Lists Changes  Observations: Added new observation of HIV RISK BEH: Heterosexual contact (04/16/2009 14:35)

## 2010-05-03 NOTE — Letter (Signed)
Summary: New Patient letter  [facility]  [address] [place], [place] [address]   Phone: [phone]  Fax: [phone]       02/07/2010 MRN: [id number]  [facility] [address] [place], [place]  [address]  Dear Latoya Wilson,  Welcome to the Gastroenterology Division at [facility].    You are scheduled to see Dr. Leone Payor on 03/30/2010 at 1:45PM on the 3rd floor at [facility], [address]  We ask that you try to arrive at our office 15 minutes prior to your appointment time to allow for check-in.  We would like you to complete the enclosed self-administered evaluation form prior to your visit and bring it with you on the day of your appointment.  We will review it with you.  Also, please bring a complete list of all your medications or, if you prefer, bring the medication bottles and we will list them.  Please bring your insurance card so that we may make a copy of it.  If your insurance requires a referral to see a specialist, please bring your referral form from your primary care physician.  Co-payments are due at the time of your visit and may be paid by cash, check or credit card.     Your office visit will consist of a consult with your physician (includes a physical exam), any laboratory testing he/she may order, scheduling of any necessary diagnostic testing (e.g. x-ray, ultrasound, CT-scan), and scheduling of a procedure (e.g. Endoscopy, Colonoscopy) if required.  Please allow enough time on your schedule to allow for any/all of these possibilities.    If you cannot keep your appointment, please call [phone] to cancel or reschedule prior to your appointment date.  This allows [place] the opportunity to schedule an appointment for another patient in need of care.  If you do not cancel or reschedule by 5 p.m. the business day prior to your appointment date, you will be charged a $50.00 late cancellation/no-show fee.    Thank you for choosing  [facility] for your medical needs.  We appreciate the opportunity to care for you.  Please visit [place] at our website  to learn more about our practice.                     Sincerely,                                                             The Gastroenterology Division

## 2010-05-03 NOTE — Progress Notes (Signed)
Summary: ATENOLOL PRESCRIPTION TO ANOTHER LOCATION ASAP!!  Phone Note Call from Patient   Caller: Patient Summary of Call: PLEASE CALL PRESCRIPTION FOR ATENOLOL 100 IN FOR PATIENT--SEE LAST O/V----[facility] HAS CLOSED---PLEASE SEND TO [facility], [address], [place]   IF POSSIBLE, PLEASE CALL TODAY---SHE IS PICKING UP FOR OTHER PRESCRIPTIONS TODAY AND SHE WILL BE OUT OF ATENOLOL TOMORROW Initial call taken by: Jerolyn Shin,  January 19, 2010 2:25 PM    Prescriptions: ATENOLOL 100 MG TABS (ATENOLOL) 1 by mouth once daily  #30 x 2   Entered by:   Almeta Monas CMA (AAMA)   Authorized by:   Loreen Freud DO   Signed by:   Almeta Monas CMA (AAMA) on 01/19/2010   Method used:   Faxed to ...       [facility] (retail)       [address] [place] [address]       [place], [place]  [address]       Ph: [phone]       Fax: [phone]   RxID:   [id number]  Pt called and stated [facility] is closed, requested rx be sent to [facility]..... Almeta Monas CMA Duncan Dull)  January 19, 2010 2:32 PM

## 2010-05-03 NOTE — Miscellaneous (Signed)
Summary: [facility]  [facility]   Imported By: Florinda Marker 04/20/2009 14:51:39  _____________________________________________________________________  External Attachment:    Type:   Image     Comment:   External Document

## 2010-05-03 NOTE — Assessment & Plan Note (Signed)
Summary: checkup [mkj]   Primary Provider:  Laury Axon  CC:  follow-up visit, lab results, and elevated B/P.  History of Present Illness: Pt c/o HA and increased BP noted at her friends house. She states that it runs in her family.  No CP.  She has been off her HIV meds for several months. She is willing to get back on them. She is seeing a chiropracter which is really helping her back and leg pain.  Depression History:      The patient denies a depressed mood most of the day and a diminished interest in her usual daily activities.        The patient denies that she feels like life is not worth living, denies that she wishes that she were dead, and denies that she has thought about ending her life.        Preventive Screening-Counseling & Management  Alcohol-Tobacco     Alcohol drinks/day: 0     Alcohol type: occassional     Smoking Status: current     Smoking Cessation Counseling: yes     Smoke Cessation Stage: contemplative     Packs/Day: 1.0     Year Started: 1975     Year Quit: 04/13/2007     Passive Smoke Exposure: no  Caffeine-Diet-Exercise     Caffeine use/day: soda     Does Patient Exercise: yes     Type of exercise: walk and other cardio     Times/week: 3-4  Hep-HIV-STD-Contraception     HIV Risk: no     Sun Exposure-Excessive: occasionally  Safety-Violence-Falls     Seat Belt Use: yes      Sexual History:  currently monogamous.        Drug Use:  never.    Comments: pt. given condoms   Updated Prior Medication List: ALBUTEROL 90 MCG/ACT  AERS (ALBUTEROL) inhale 2 puffs four times a day as needed LYRICA 200 MG  CAPS (PREGABALIN) 2 by mouth two times a day EFFEXOR XR 150 MG XR24H-CAP (VENLAFAXINE HCL) 2 tabs by mouth daily ATIVAN 0.5 MG TABS (LORAZEPAM) 1 by mouth three times a day as needed TRUVADA 200-300 MG TABS (EMTRICITABINE-TENOFOVIR) Take 1 tablet by mouth once a day REYATAZ 300 MG CAPS (ATAZANAVIR SULFATE) Take 1 tablet by mouth once a day ATENOLOL 50  MG TABS (ATENOLOL) Take 1 tablet by mouth once a day NORVIR 100 MG TABS (RITONAVIR) Take 1 tablet by mouth once a day  Current Allergies (reviewed today): No known allergies  Past History:  Past Medical History: Last updated: 12/13/2006 Asthma Depression/ bipolar  Review of Systems       The patient complains of headaches.  The patient denies fever, chest pain, and dyspnea on exertion.    Vital Signs:  Patient profile:   50 year old female Menstrual status:  regular Height:      65.5 inches (166.37 cm) Weight:      150.4 pounds (68.36 kg) BMI:     24.74 Temp:     98.4 degrees F (36.89 degrees C) oral Pulse rate:   83 / minute BP sitting:   170 / 105  (right arm)  Vitals Entered By: Wendall Mola CMA Duncan Dull) (January 03, 2010 2:32 PM) CC: follow-up visit, lab results, elevated B/P Is Patient Diabetic? No Pain Assessment Patient in pain? yes     Location: legs Intensity: 2 Type: aching Onset of pain  Constant Nutritional Status BMI of 19 -24 = normal Nutritional Status  Detail appetite "good"  Does patient need assistance? Functional Status Self care Ambulation Normal Comments no missed doses of meds per pt.   Physical Exam  General:  alert, well-developed, well-nourished, and well-hydrated.   Head:  normocephalic and atraumatic.   Lungs:  normal breath sounds.   Heart:  normal rate, regular rhythm, and no murmur.      Impression & Recommendations:  Problem # 1:  HIV INFECTION (ICD-042) Will re-start meds and check labs in 6 weeks. Diagnostics Reviewed:  HIV: CDC-defined AIDS (05/22/2009)   HIV-Western blot: Positive (04/11/2007)   CD4: 280 (12/21/2009)   WBC: 5.1 (12/20/2009)   Hgb: 14.2 (12/20/2009)   HCT: 43.8 (12/20/2009)   Platelets: 189 (12/20/2009) HIV-1 RNA: 3850 (12/20/2009)   HBSAg: NEG (04/11/2007)  Orders: Est. Patient Level III (99213)Future Orders: T-CD4SP ([facility]) (CD4SP) ... 02/14/2010 T-HIV Viral Load [phone]) ...  02/14/2010 T-Comprehensive Metabolic Panel [id number]) ... 02/14/2010 T-CBC w/Diff ([id number]) ... 02/14/2010  Problem # 2:  INCREASED BLOOD PRESSURE (ICD-796.2)  Will start pt on atenolol and re-check. Her updated medication list for this problem includes:    Atenolol 50 Mg Tabs (Atenolol) .Marland Kitchen... Take 1 tablet by mouth once a day  BP today: 170/105 Prior BP: 130/90 (10/25/2009)  Labs Reviewed: Creat: 0.72 (12/20/2009) Chol: 201 (12/20/2009)   HDL: 54 (12/20/2009)   LDL: 134 (12/20/2009)   TG: 67 (12/20/2009)  Instructed in low sodium diet (DASH Handout) and behavior modification.    Problem # 3:  PREVENTIVE HEALTH CARE (ICD-V70.0) will schedule a screening colonoscopy Orders: Gastroenterology Referral (GI)  Medications Added to Medication List This Visit: 1)  Truvada 200-300 Mg Tabs (Emtricitabine-tenofovir) .... Take 1 tablet by mouth once a day 2)  Reyataz 300 Mg Caps (Atazanavir sulfate) .... Take 1 tablet by mouth once a day 3)  Atenolol 50 Mg Tabs (Atenolol) .... Take 1 tablet by mouth once a day 4)  Norvir 100 Mg Tabs (Ritonavir) .... Take 1 tablet by mouth once a day  Other Orders: Influenza Vaccine NON MCR (28413)  Patient Instructions: 1)  Please schedule a follow-up appointment in 8 weeks, 2 weeks after labs.  Prescriptions: NORVIR 100 MG TABS (RITONAVIR) Take 1 tablet by mouth once a day  #30 x 5   Entered and Authorized by:   Yisroel Ramming MD   Signed by:   Yisroel Ramming MD on 01/03/2010   Method used:   Print then Give to Patient   RxID:   [id number] ATENOLOL 50 MG TABS (ATENOLOL) Take 1 tablet by mouth once a day  #30 x 5   Entered and Authorized by:   Yisroel Ramming MD   Signed by:   Yisroel Ramming MD on 01/03/2010   Method used:   Print then Give to Patient   RxID:   [id number] REYATAZ 300 MG CAPS (ATAZANAVIR SULFATE) Take 1 tablet by mouth once a day  #30 x 5   Entered and Authorized by:   Yisroel Ramming MD   Signed by:   Yisroel Ramming MD  on 01/03/2010   Method used:   Print then Give to Patient   RxID:   [id number] TRUVADA 200-300 MG TABS (EMTRICITABINE-TENOFOVIR) Take 1 tablet by mouth once a day  #30 x 5   Entered and Authorized by:   Yisroel Ramming MD   Signed by:   Yisroel Ramming MD on 01/03/2010   Method used:   Print then Give to Patient   RxID:   [id number]     Prevention &  Chronic Care Immunizations   Influenza vaccine: Fluvax Non-MCR  (01/03/2010)    Tetanus booster: 04/14/1998: Historical    Pneumococcal vaccine: Pneumovax  (04/11/2007)  Colorectal Screening   Hemoccult: Not documented    Colonoscopy: Not documented  Other Screening   Pap smear: NEGATIVE FOR INTRAEPITHELIAL LESIONS OR MALIGNANCY. BENIGN REACTIVE/REPARATIVE CHANGES.  (06/11/2009)   Pap smear due: 12/2007    Mammogram: Not documented   Smoking status: current  (01/03/2010)   Smoking cessation counseling: yes  (01/03/2010)  Lipids   Total Cholesterol: 201  (12/20/2009)   LDL: 134  (12/20/2009)   LDL Direct: 163.1  (12/13/2006)   HDL: 54  (12/20/2009)   Triglycerides: 67  (12/20/2009)    Immunizations Administered:  Influenza Vaccine # 1:    Vaccine Type: Fluvax Non-MCR    Site: left deltoid    Mfr: Novartis    Dose: 0.5 ml    Route: IM    Given by: Wendall Mola CMA ( AAMA)    Exp. Date: 07/03/2010    Lot #: [id number]    VIS given: 10/26/09 version given January 03, 2010.  Flu Vaccine Consent Questions:    Do you have a history of severe allergic reactions to this vaccine? no    Any prior history of allergic reactions to egg and/or gelatin? no    Do you have a sensitivity to the preservative Thimersol? no    Do you have a past history of Guillan-Barre Syndrome? no    Do you currently have an acute febrile illness? no    Have you ever had a severe reaction to latex? no    Vaccine information given and explained to patient? yes    Are you currently pregnant? no

## 2010-05-03 NOTE — Progress Notes (Signed)
Summary: samples/voucher given to pt  Phone Note Call from Patient   Caller: Patient walk-in labs Summary of Call: Patient came in a crisis, about to loose home, lost medicaid, and food stamps.  Patient states she does not have any medication and has asked for help in obtaining it today.  I don't have samples of the Truvada, but am willing to temporary enroll into the Truvada Advancing Access progra m to obtain a one month supply while she currently wait for ADAP approval.  She is in the process of getting the information required for the program.  She was told the earlier she brings paperwork, the sooner I can send her information so she can get approved to get her medication. I can give her samples of Norvir and Reyataz. Initial call taken by: Paulo Fruit  BS,CPht II,MPH,  May 12, 2009 2:32 PM    Prescriptions: TRUVADA 200-300 MG  TABS (EMTRICITABINE-TENOFOVIR) Take 1 tablet by mouth once a day  #30 x 0   Entered by:   Paulo Fruit  BS,CPht II,MPH   Authorized by:   Yisroel Ramming MD   Signed by:   Paulo Fruit  BS,CPht II,MPH on 05/12/2009   Method used:   Print then Give to Patient   RxID:   [id number] NORVIR 100 MG  CAPS (RITONAVIR) Take 1 tablet by mouth once a day  #30 x 0   Entered by:   Paulo Fruit  BS,CPht II,MPH   Authorized by:   Yisroel Ramming MD   Signed by:   Paulo Fruit  BS,CPht II,MPH on 05/12/2009   Method used:   Samples Given   RxID:   [id number] REYATAZ 300 MG  CAPS (ATAZANAVIR SULFATE) Take 1 tablet by mouth once a day  #30 x 0   Entered by:   Paulo Fruit  BS,CPht II,MPH   Authorized by:   Yisroel Ramming MD   Signed by:   Paulo Fruit  BS,CPht II,MPH on 05/12/2009   Method used:   Samples Given   RxID:   [id number]  Sample Given, Lot #: Reyataz 300mg  [id number] Expiration Date:Mar 2012 Patient has been instructed regarding the correct time, dose and frequency of taking this med, including desired effects and most common side  effects.  Sample Given, Lot #: Norvir 100mg  [id number] Expiration Date:13 Feb 2010 Patient has been instructed regarding the correct time, dose and frequency of taking this med, including desired effects and most common side effects.  Temporary Voucher given to patient to obtain Truvada free until reapproved for [place]. Paulo Fruit  BS,CPht II,MPH  May 12, 2009 2:57 PM

## 2010-05-03 NOTE — Assessment & Plan Note (Signed)
Summary: F/U/VS   Primary Provider:  Laury Axon  CC:  follow-up visit and lab results.  History of Present Illness: patient here for follow-up on her lab results.  She has been seeing her primary care provider for her diffuse muscle pain and fatigue.  They have not been able to identify an etiology.  She is currently looking for a new place to stay since the place she is living in right now does not have to heat.  Depression History:      The patient is having a depressed mood most of the day and has a diminished interest in her usual daily activities.        The patient denies that she feels like life is not worth living, denies that she wishes that she were dead, and denies that she has thought about ending her life.        Preventive Screening-Counseling & Management  Alcohol-Tobacco     Alcohol drinks/day: 0     Alcohol type: occassional     Smoking Status: current     Smoking Cessation Counseling: yes     Smoke Cessation Stage: contemplative     Packs/Day: 1.0     Year Started: 1975     Year Quit: 04/13/2007     Passive Smoke Exposure: no  Caffeine-Diet-Exercise     Caffeine use/day: soda     Does Patient Exercise: yes     Type of exercise: walk and other cardio     Times/week: 3-4  Hep-HIV-STD-Contraception     HIV Risk: no     Sun Exposure-Excessive: occasionally  Safety-Violence-Falls     Seat Belt Use: yes      Sexual History:  currently monogamous.        Drug Use:  never.        Blood Transfusions:  no.    Comments: pt. declined condoms   Updated Prior Medication List: ALBUTEROL 90 MCG/ACT  AERS (ALBUTEROL) inhale 2 puffs four times a day as needed LYRICA 200 MG  CAPS (PREGABALIN) 2 by mouth two times a day EFFEXOR XR 150 MG XR24H-CAP (VENLAFAXINE HCL) 2 tabs by mouth daily ATIVAN 0.5 MG TABS (LORAZEPAM) 1 by mouth three times a day as needed TRUVADA 200-300 MG TABS (EMTRICITABINE-TENOFOVIR) Take 1 tablet by mouth once a day REYATAZ 300 MG CAPS  (ATAZANAVIR SULFATE) Take 1 tablet by mouth once a day NORVIR 100 MG TABS (RITONAVIR) Take 1 tablet by mouth once a day ASPIR-LOW 81 MG TBEC (ASPIRIN) 1 by mouth once daily ATENOLOL 100 MG TABS (ATENOLOL) 1 by mouth once daily VICODIN ES 7.5-750 MG TABS (HYDROCODONE-ACETAMINOPHEN) 1 by mouth every 6 hours as needed DEPAKOTE 250 MG TBEC (DIVALPROEX SODIUM) Take 1 tablet by mouth once a day AUGMENTIN 875-125 MG TABS (AMOXICILLIN-POT CLAVULANATE) Take 1 tablet by mouth two times a day  Current Allergies (reviewed today): No known allergies  Past History:  Past Medical History: Last updated: 01/07/2010 Asthma Depression/ bipolar Hypertension  Review of Systems  The patient denies anorexia, fever, weight loss, and chest pain.    Vital Signs:  Patient profile:   50 year old female Menstrual status:  regular Height:      65.5 inches (166.37 cm) Weight:      160.8 pounds (73.09 kg) BMI:     26.45 Temp:     98.3 degrees F (36.83 degrees C) oral Pulse rate:   78 / minute BP sitting:   145 / 94  (right arm)  Vitals  Entered By: Wendall Mola CMA Duncan Dull) (March 04, 2010 10:30 AM) CC: follow-up visit, lab results Is Patient Diabetic? No Pain Assessment Patient in pain? no      Nutritional Status BMI of 25 - 29 = overweight Nutritional Status Detail appetite "low"  Have you ever been in a relationship where you felt threatened, hurt or afraid?No   Does patient need assistance? Functional Status Self care Ambulation Normal Comments no missed doses of HAART meds   Physical Exam  General:  alert, well-developed, well-nourished, and well-hydrated.   Head:  normocephalic and atraumatic.   Mouth:  pharynx pink and moist.   Lungs:  normal breath sounds.      Impression & Recommendations:  Problem # 1:  HIV INFECTION (ICD-042) Pt.s most recent CD4ct was 210 and VL  48.  Pt instructed to continue the current antiretroviral regimen.  Pt encouraged to take medication  regularly and not miss doses.  Pt will f/u in 3 months for repeat blood work and will see me 2 weeks later.  Diagnostics Reviewed:  HIV: CDC-defined AIDS (05/22/2009)   HIV-Western blot: Positive (04/11/2007)   CD4: 210 (02/17/2010)   WBC: 4.6 (02/16/2010)   Hgb: 13.7 (02/16/2010)   HCT: 41.8 (02/16/2010)   Platelets: 173 (02/16/2010) HIV-1 RNA: 48 (02/16/2010)   HBSAg: NEG (04/11/2007)  Orders: Est. Patient Level III (99213)Future Orders: T-CD4SP ([facility]) (CD4SP) ... 06/02/2010 T-HIV Viral Load [phone]) ... 06/02/2010 T-Comprehensive Metabolic Panel [id number]) ... 06/02/2010 T-CBC w/Diff ([id number]) ... 06/02/2010  Her updated medication list for this problem includes:    Augmentin 875-125 Mg Tabs (Amoxicillin-pot clavulanate) .Marland Kitchen... Take 1 tablet by mouth two times a day  Problem # 2:  ACUTE SINUSITIS, UNSPECIFIED (ICD-461.9)  Her updated medication list for this problem includes:    Augmentin 875-125 Mg Tabs (Amoxicillin-pot clavulanate) .Marland Kitchen... Take 1 tablet by mouth two times a day  Medications Added to Medication List This Visit: 1)  Depakote 250 Mg Tbec (Divalproex sodium) .... Take 1 tablet by mouth once a day 2)  Augmentin 875-125 Mg Tabs (Amoxicillin-pot clavulanate) .... Take 1 tablet by mouth two times a day  Patient Instructions: 1)  Please schedule a follow-up appointment in 3 months, 2 weeks after labs.  Prescriptions: AUGMENTIN 875-125 MG TABS (AMOXICILLIN-POT CLAVULANATE) Take 1 tablet by mouth two times a day  #20 x 0   Entered and Authorized by:   Yisroel Ramming MD   Signed by:   Yisroel Ramming MD on 03/04/2010   Method used:   Print then Give to Patient   RxID:   [id number]

## 2010-05-03 NOTE — Letter (Signed)
Summary: Pre Visit Letter Revised  [facility]  [address] [place], [place] [address]   Phone: [phone]  Fax: [phone]        01/03/2010 MRN: [id number]  [facility] [address] [place], [place]  [address]             Procedure Date:  11-21 at 11:30am   Welcome to the Gastroenterology Division at [facility].    You are scheduled to see a nurse for your pre-procedure visit on 02-07-10 at 2:30pm on the 3rd floor at [facility], [address]  We ask that you try to arrive at our office 15 minutes prior to your appointment time to allow for check-in.  Please take a minute to review the attached form.  If you answer "Yes" to one or more of the questions on the first page, we ask that you call the person listed at your earliest opportunity.  If you answer "No" to all of the questions, please complete the rest of the form and bring it to your appointment.    Your nurse visit will consist of discussing your medical and surgical history, your immediate family medical history, and your medications.   If you are unable to list all of your medications on the form, please bring the medication bottles to your appointment and we will list them.  We will need to be aware of both prescribed and over the counter drugs.  We will need to know exact dosage information as well.    Please be prepared to read and sign documents such as consent forms, a financial agreement, and acknowledgement forms.  If necessary, and with your consent, a friend or relative is welcome to sit-in on the nurse visit with you.  Please bring your insurance card so that we may make a copy of it.  If your insurance requires a referral to see a specialist, please bring your referral form from your primary care physician.  No co-pay is required for this nurse visit.     If you cannot keep your appointment, please call [phone] to cancel or reschedule prior to your appointment date.   This allows [place] the opportunity to schedule an appointment for another patient in need of care.    Thank you for choosing [facility] for your medical needs.  We appreciate the opportunity to care for you.  Please visit [place] at our website  to learn more about our practice.  Sincerely, The Gastroenterology Division

## 2010-05-03 NOTE — Progress Notes (Signed)
Summary: B/P very elevated  Phone Note Call from Patient   Caller: Patient Summary of Call: Pt. showed early for appt.  Wanted her B/P checked because it would not register at her friends house.  When I checked it was 181/114 right arm and 189/134 left arm.  Advised her to stay and see Dr. Philipp Deputy this afternoon.  She agreed to wait. Initial call taken by: Wendall Mola CMA Duncan Dull),  January 03, 2010 12:16 PM  Follow-up for Phone Call        pt seen and started on BP medication Follow-up by: Yisroel Ramming MD,  January 03, 2010 3:26 PM

## 2010-05-03 NOTE — Miscellaneous (Signed)
Summary: RW Update  Clinical Lists Changes  Observations: Added new observation of YEARAIDSPOS: 2009  (05/22/2009 10:40) Added new observation of HIV STATUS: CDC-defined AIDS  (05/22/2009 10:40)

## 2010-05-03 NOTE — Assessment & Plan Note (Signed)
Summary: pain ball of foot /cbs   Vital Signs:  Patient profile:   50 year old female Menstrual status:  regular Height:      65.5 inches Weight:      148 pounds Temp:     98.3 degrees F oral Pulse rate:   72 / minute BP sitting:   130 / 90  (left arm)  Vitals Entered By: Jeremy Johann CMA (October 25, 2009 8:30 AM) CC: PLANTAR WART ON BOTTOM OF RIGHT FOOT   History of Present Illness: Pt c/o plantar wart R foot x several months.  It has become more painful.      Preventive Screening-Counseling & Management  Alcohol-Tobacco     Alcohol drinks/day: 0     Alcohol type: occassional     Smoking Status: current     Smoking Cessation Counseling: yes     Smoke Cessation Stage: contemplative     Packs/Day: 1.0     Year Started: 1974     Year Quit: 04/13/2007     Passive Smoke Exposure: no  Caffeine-Diet-Exercise     Caffeine use/day: soda     Does Patient Exercise: yes     Type of exercise: walk and other cardio     Times/week: 3-4  Current Medications (verified): 1)  Albuterol 90 Mcg/act  Aers (Albuterol) .... Inhale 2 Puffs Four Times A Day As Needed 2)  Lyrica 200 Mg  Caps (Pregabalin) .... 2 By Mouth Two Times A Day 3)  Effexor Xr 150 Mg Xr24h-Cap (Venlafaxine Hcl) .... 2 Tabs By Mouth Daily 4)  Ativan 0.5 Mg Tabs (Lorazepam) .Marland Kitchen.. 1 By Mouth Three Times A Day As Needed  Allergies (verified): No Known Drug Allergies  Past History:  Past medical, surgical, family and social histories (including risk factors) reviewed for relevance to current acute and chronic problems.  Past Medical History: Reviewed history from 12/13/2006 and no changes required. Asthma Depression/ bipolar  Past Surgical History: Reviewed history from 12/13/2006 and no changes required. Denies surgical history  Family History: Reviewed history from 12/13/2006 and no changes required. M-  MVP, hyperlipidemia Family History of CAD Female 1st degree relative at 33-- father PGF--DM, CAD Family  History High cholesterol Family History of Anxiety Family History Depression  Social History: Reviewed history from 12/13/2006 and no changes required. unemployed Married- separated Current Smoker Alcohol use-yes Drug use-yes Regular exercise-yes  Review of Systems      See HPI  Physical Exam  General:  Well-developed,well-nourished,in no acute distress; alert,appropriate and cooperative throughout examination Skin:  R foot-- plantar wart Psych:  Oriented X3 and normally interactive.     Impression & Recommendations:  Problem # 1:  PLANTAR WART, RIGHT (NUU-725.36)  Orders: Podiatry Referral (Podiatry)  Complete Medication List: 1)  Albuterol 90 Mcg/act Aers (Albuterol) .... Inhale 2 puffs four times a day as needed 2)  Lyrica 200 Mg Caps (Pregabalin) .... 2 by mouth two times a day 3)  Effexor Xr 150 Mg Xr24h-cap (Venlafaxine hcl) .... 2 tabs by mouth daily 4)  Ativan 0.5 Mg Tabs (Lorazepam) .Marland Kitchen.. 1 by mouth three times a day as needed

## 2010-05-03 NOTE — Letter (Signed)
Summary: [facility]  [facility]   Imported By: Lanelle Bal 06/22/2009 12:13:30  _____________________________________________________________________  External Attachment:    Type:   Image     Comment:   External Document

## 2010-05-03 NOTE — Miscellaneous (Signed)
Summary: [facility]   Imported By: Florinda Marker 02/09/2010 10:24:38  _____________________________________________________________________  External Attachment:    Type:   Image     Comment:   External Document

## 2010-05-03 NOTE — Progress Notes (Signed)
Summary: Ativan RF  Phone Note Refill Request Message from:  Fax from Pharmacy on December 13, 2009 4:38 PM  Refills Requested: Medication #1:  ATIVAN 0.5 MG TABS 1 by mouth three times a day as needed. [facility] - fax [phone]  Initial call taken by: Okey Regal [place],  December 13, 2009 4:39 PM  Follow-up for Phone Call        Last OV 10/25/09 last filled 11/10/09 please advise. Follow-up by: Almeta Monas CMA Duncan Dull),  December 13, 2009 5:01 PM  Additional Follow-up for Phone Call Additional follow up Details #1::        refill x1 Additional Follow-up by: Loreen Freud DO,  December 13, 2009 5:03 PM    Prescriptions: ATIVAN 0.5 MG TABS (LORAZEPAM) 1 by mouth three times a day as needed  #60 x 0   Entered by:   Almeta Monas CMA (AAMA)   Authorized by:   Loreen Freud DO   Signed by:   Almeta Monas CMA (AAMA) on 12/13/2009   Method used:   Printed then faxed to ...       [facility]       [address] [place], [place]  [address]       Ph: [phone] or [phone]       Fax: [phone]   RxID:   [id number]

## 2010-05-03 NOTE — Medication Information (Signed)
Summary: Prior Authorization & Approval for Lyrica/[place] Medicaid  Prior Authorization & Approval for Lyrica/[place] [employer]   Imported By: Lanelle Bal 01/25/2010 13:58:31  _____________________________________________________________________  External Attachment:    Type:   Image     Comment:   External Document

## 2010-05-03 NOTE — Assessment & Plan Note (Signed)
Summary: rto 2 weeks/cbs   Vital Signs:  Patient profile:   50 year old female Menstrual status:  regular Weight:      153.2 pounds Pulse rate:   80 / minute Pulse rhythm:   regular BP sitting:   134 / 80  (left arm) Cuff size:   regular  Vitals Entered By: Almeta Monas CMA Duncan Dull) (January 21, 2010 1:59 PM) CC: BP f/u-- c/o lower back pain, Back Pain   History of Present Illness:       This is a 50 year old woman who presents with Back Pain.  The symptoms began 4-8 weeks ago.  Pt states pain gradually came on and now radiates down both legs.  The patient denies fever, chills, weakness, loss of sensation, fecal incontinence, urinary incontinence, urinary retention, dysuria, rest pain, inability to work, and inability to care for self.  The pain is located in the right low back.  The pain began at home and gradually.  The pain radiates to the right leg below the knee and left leg below the knee.  The pain is made worse by flexion and extension.  Pain is worse with sitting and standing.  No xrays have been done.     Current Medications (verified): 1)  Albuterol 90 Mcg/act  Aers (Albuterol) .... Inhale 2 Puffs Four Times A Day As Needed 2)  Lyrica 200 Mg  Caps (Pregabalin) .... 2 By Mouth Two Times A Day 3)  Effexor Xr 150 Mg Xr24h-Cap (Venlafaxine Hcl) .... 2 Tabs By Mouth Daily 4)  Ativan 0.5 Mg Tabs (Lorazepam) .Marland Kitchen.. 1 By Mouth Three Times A Day As Needed 5)  Truvada 200-300 Mg Tabs (Emtricitabine-Tenofovir) .... Take 1 Tablet By Mouth Once A Day 6)  Reyataz 300 Mg Caps (Atazanavir Sulfate) .... Take 1 Tablet By Mouth Once A Day 7)  Norvir 100 Mg Tabs (Ritonavir) .... Take 1 Tablet By Mouth Once A Day 8)  Aspir-Low 81 Mg Tbec (Aspirin) .Marland Kitchen.. 1 By Mouth Once Daily 9)  Atenolol 100 Mg Tabs (Atenolol) .Marland Kitchen.. 1 By Mouth Once Daily 10)  Vicodin Es 7.5-750 Mg Tabs (Hydrocodone-Acetaminophen) .Marland Kitchen.. 1 By Mouth Every 6 Hours As Needed  Allergies (verified): No Known Drug Allergies  Past  History:  Past Medical History: Last updated: 01/07/2010 Asthma Depression/ bipolar Hypertension  Past Surgical History: Last updated: 12/13/2006 Denies surgical history  Family History: Last updated: 12/13/2006 M-  MVP, hyperlipidemia Family History of CAD Female 1st degree relative at 36-- father PGF--DM, CAD Family History High cholesterol Family History of Anxiety Family History Depression  Social History: Last updated: 12/13/2006 unemployed Married- separated Current Smoker Alcohol use-yes Drug use-yes Regular exercise-yes  Risk Factors: Alcohol Use: 0 (01/03/2010) Caffeine Use: soda (01/03/2010) Exercise: yes (01/03/2010)  Risk Factors: Smoking Status: current (01/03/2010) Packs/Day: 1.0 (01/03/2010) Passive Smoke Exposure: no (01/03/2010)  Family History: Reviewed history from 12/13/2006 and no changes required. M-  MVP, hyperlipidemia Family History of CAD Female 1st degree relative at 44-- father PGF--DM, CAD Family History High cholesterol Family History of Anxiety Family History Depression  Social History: Reviewed history from 12/13/2006 and no changes required. unemployed Married- separated Current Smoker Alcohol use-yes Drug use-yes Regular exercise-yes  Review of Systems      See HPI  Physical Exam  General:  Well-developed,well-nourished,in no acute distress; alert,appropriate and cooperative throughout examination Neck:  No deformities, masses, or tenderness noted. Lungs:  Normal respiratory effort, chest expands symmetrically. Lungs are clear to auscultation, no crackles or wheezes. Heart:  normal rate and no murmur.   Msk:  normal ROM, no joint tenderness, no joint swelling, no joint warmth, no redness over joints, no joint deformities, no joint instability, and no crepitation.   Extremities:  No clubbing, cyanosis, edema, or deformity noted with normal full range of motion of all joints.   Neurologic:  alert & oriented X3, gait  normal, and DTRs symmetrical and normal.   Skin:  Intact without suspicious lesions or rashes Psych:  Cognition and judgment appear intact. Alert and cooperative with normal attention span and concentration. No apparent delusions, illusions, hallucinations   Impression & Recommendations:  Problem # 1:  BACK PAIN, LUMBAR, WITH RADICULOPATHY (ICD-724.4)  Her updated medication list for this problem includes:    Aspir-low 81 Mg Tbec (Aspirin) .Marland Kitchen... 1 by mouth once daily    Vicodin Es 7.5-750 Mg Tabs (Hydrocodone-acetaminophen) .Marland Kitchen... 1 by mouth every 6 hours as needed  Orders: T-Lumbar Spine w/Flex & Ext 4 Views (72120TC) UA Dipstick w/o Micro (manual) (84166)  Discussed use of moist heat or ice, modified activities, medications, and stretching/strengthening exercises. Back care instructions given. To be seen in 2 weeks if no improvement; sooner if worsening of symptoms.   Problem # 2:  HYPERTENSION (ICD-401.9)  Her updated medication list for this problem includes:    Atenolol 100 Mg Tabs (Atenolol) .Marland Kitchen... 1 by mouth once daily  BP today: 134/80 Prior BP: 148/96 (01/07/2010)  Labs Reviewed: K+: 3.7 (12/20/2009) Creat: : 0.72 (12/20/2009)   Chol: 201 (12/20/2009)   HDL: 54 (12/20/2009)   LDL: 134 (12/20/2009)   TG: 67 (12/20/2009)  Complete Medication List: 1)  Albuterol 90 Mcg/act Aers (Albuterol) .... Inhale 2 puffs four times a day as needed 2)  Lyrica 200 Mg Caps (Pregabalin) .... 2 by mouth two times a day 3)  Effexor Xr 150 Mg Xr24h-cap (Venlafaxine hcl) .... 2 tabs by mouth daily 4)  Ativan 0.5 Mg Tabs (Lorazepam) .Marland Kitchen.. 1 by mouth three times a day as needed 5)  Truvada 200-300 Mg Tabs (Emtricitabine-tenofovir) .... Take 1 tablet by mouth once a day 6)  Reyataz 300 Mg Caps (Atazanavir sulfate) .... Take 1 tablet by mouth once a day 7)  Norvir 100 Mg Tabs (Ritonavir) .... Take 1 tablet by mouth once a day 8)  Aspir-low 81 Mg Tbec (Aspirin) .Marland Kitchen.. 1 by mouth once daily 9)   Atenolol 100 Mg Tabs (Atenolol) .Marland Kitchen.. 1 by mouth once daily 10)  Vicodin Es 7.5-750 Mg Tabs (Hydrocodone-acetaminophen) .Marland Kitchen.. 1 by mouth every 6 hours as needed  Patient Instructions: 1)  Most patients (90%) with low back pain will improve with time ( 2-6 weeks). Keep active but avoid activities that are painful. Apply moist heat and/or ice to lower back several times a day.  2)  Please schedule a follow-up appointment in 3 months .  Prescriptions: VICODIN ES 7.5-750 MG TABS (HYDROCODONE-ACETAMINOPHEN) 1 by mouth every 6 hours as needed  #30 x 0   Entered and Authorized by:   Loreen Freud DO   Signed by:   Loreen Freud DO on 01/21/2010   Method used:   Print then Give to Patient   RxID:   [id number]    Orders Added: 1)  T-Lumbar Spine w/Flex & Ext 4 Views [72120TC] 2)  Est. Patient Level III [32202] 3)  UA Dipstick w/o Micro (manual) [81002]  Appended Document: Orders Update    Clinical Lists Changes  Observations: Added new observation of PH URINE: 6.0  (01/21/2010 15:09) Added new  observation of SPEC GR URIN: <1.005  (01/21/2010 15:09) Added new observation of APPEARANCE U: Clear  (01/21/2010 15:09) Added new observation of UA COLOR: yellow  (01/21/2010 15:09) Added new observation of WBC DIPSTK U: negative  (01/21/2010 15:09) Added new observation of NITRITE URN: negative  (01/21/2010 15:09) Added new observation of UROBILINOGEN: 0.2  (01/21/2010 15:09) Added new observation of PROTEIN, URN: negative  (01/21/2010 15:09) Added new observation of BLOOD UR DIP: trace-lysed  (01/21/2010 15:09) Added new observation of KETONES URN: negative  (01/21/2010 15:09) Added new observation of BILIRUBIN UR: negative  (01/21/2010 15:09) Added new observation of GLUCOSE, URN: negative  (01/21/2010 15:09)      Laboratory Results   Urine Tests    Routine Urinalysis   Color: yellow Appearance: Clear Glucose: negative   (Normal Range: Negative) Bilirubin: negative   (Normal  Range: Negative) Ketone: negative   (Normal Range: Negative) Spec. Gravity: <1.005   (Normal Range: 1.003-1.035) Blood: trace-lysed   (Normal Range: Negative) pH: 6.0   (Normal Range: 5.0-8.0) Protein: negative   (Normal Range: Negative) Urobilinogen: 0.2   (Normal Range: 0-1) Nitrite: negative   (Normal Range: Negative) Leukocyte Esterace: negative   (Normal Range: Negative)

## 2010-05-03 NOTE — Assessment & Plan Note (Signed)
Summary: F/U APPT/VS   Primary Provider:  Laury Axon  CC:  follow-up visit lab results and Depression.  History of Present Illness: Pt has had a lot of stress in her life recently. She lost her Medicaid so has had problems seeing her specialists. She has an appt. with her Neurologist coming up. She c/o continue numbness and pain in her extremties and memory loss. She is also being seen at [facility].  They have tried her on a new anxiety medication that is making her very sleepy. She stopped it and will follow up with her Psychiatrist. No missed doses of her HIV meds.   Depression History:      The patient denies a depressed mood most of the day but notes a diminished interest in her usual daily activities.        The patient denies that she feels like life is not worth living, denies that she wishes that she were dead, and denies that she has thought about ending her life.        Preventive Screening-Counseling & Management  Alcohol-Tobacco     Alcohol drinks/day: 0     Alcohol type: occassional     Smoking Status: current     Smoking Cessation Counseling: yes     Smoke Cessation Stage: contemplative     Packs/Day: 1.0     Year Started: 1975     Year Quit: 04/13/2007     Passive Smoke Exposure: no  Caffeine-Diet-Exercise     Caffeine use/day: soda     Does Patient Exercise: yes     Type of exercise: walk and other cardio     Times/week: 3-4      Sexual History:  currently monogamous.        Drug Use:  never.        Blood Transfusions:  no.     Updated Prior Medication List: ALBUTEROL 90 MCG/ACT  AERS (ALBUTEROL) inhale 2 puffs four times a day as needed LYRICA 200 MG  CAPS (PREGABALIN) 2 by mouth two times a day REYATAZ 300 MG  CAPS (ATAZANAVIR SULFATE) Take 1 tablet by mouth once a day NORVIR 100 MG  CAPS (RITONAVIR) Take 1 tablet by mouth once a day TRUVADA 200-300 MG  TABS (EMTRICITABINE-TENOFOVIR) Take 1 tablet by mouth once a day EFFEXOR XR 150 MG XR24H-CAP  (VENLAFAXINE HCL) 2 tabs by mouth daily WELLBUTRIN 75 MG TABS (BUPROPION HCL) 1 tab by mouth twice a day FERROUS SULFATE 325 (65 FE) MG TABS (FERROUS SULFATE) 1 by mouth three times a day  Current Allergies (reviewed today): No known allergies  Past History:  Past Medical History: Last updated: 12/13/2006 Asthma Depression/ bipolar  Review of Systems  The patient denies anorexia, fever, and weight loss.    Vital Signs:  Patient profile:   50 year old female Menstrual status:  regular Height:      65.5 inches (166.37 cm) Weight:      149.3 pounds (67.86 kg) BMI:     24.56 Temp:     97.2 degrees F (36.22 degrees C) oral Pulse rate:   83 / minute BP sitting:   141 / 93  (left arm)  Vitals Entered By: Wendall Mola CMA Duncan Dull) (June 09, 2009 10:42 AM) CC: follow-up visit lab results, Depression Is Patient Diabetic? No Pain Assessment Patient in pain? yes     Location: legs and arms Intensity: 6 Type: aching Onset of pain  Constant Nutritional Status BMI of 19 -24 =  normal Nutritional Status Detail appetite "up and down"  Does patient need assistance? Functional Status Self care Ambulation Normal Comments no missed doses of meds per patient   Physical Exam  General:  alert, well-developed, well-nourished, and well-hydrated.   Head:  normocephalic and atraumatic.   Mouth:  pharynx pink and moist.   Lungs:  normal breath sounds.          Medication Adherence: 06/09/2009   Adherence to medications reviewed with patient. Counseling to provide adequate adherence provided   Prevention For Positives: 06/09/2009   Safe sex practices discussed with patient. Condoms offered.                             Impression & Recommendations:  Problem # 1:  HIV INFECTION (ICD-042) Pt.s most recent CD4ct was 410 and VL <48 .  Pt instructed to continue the current antiretroviral regimen.  Pt encouraged to take medication regularly and not miss doses.  Pt will  f/u in 3 months for repeat blood work and will see me 2 weeks later. Influenza vaccine given today.  Will schedule her for a PAP.  Diagnostics Reviewed:  HIV: CDC-defined AIDS (05/22/2009)   HIV-Western blot: Positive (04/11/2007)   CD4: 410 (05/14/2009)   WBC: 5.8 (05/14/2009)   Hgb: 14.2 (05/14/2009)   HCT: 44.0 (05/14/2009)   Platelets: 234 (05/14/2009) HIV-1 RNA: <48 copies/mL (05/13/2009)   HBSAg: NEG (04/11/2007)  Orders: Est. Patient Level III (99213)Future Orders: T-CD4SP ([facility]) (CD4SP) ... 09/07/2009 T-HIV Viral Load [phone]) ... 09/07/2009 T-Comprehensive Metabolic Panel [phone]) ... 09/07/2009 T-CBC w/Diff ([id number]) ... 09/07/2009 T-RPR (Syphilis) [phone]) ... 09/07/2009  Problem # 2:  DEPRESSION (ICD-311) follow-up at [facility]. The following medications were removed from the medication list:    Bupropion Hcl 100 Mg Xr12h-tab (Bupropion hcl) .Marland Kitchen... Take 1 tab by mouth in the morning.    Venlafaxine Hcl 150 Mg Xr24h-cap (Venlafaxine hcl) .Marland Kitchen... Take 2 by mouth daily. Her updated medication list for this problem includes:    Effexor Xr 150 Mg Xr24h-cap (Venlafaxine hcl) .Marland Kitchen... 2 tabs by mouth daily    Wellbutrin 75 Mg Tabs (Bupropion hcl) .Marland Kitchen... 1 tab by mouth twice a day  Problem # 3:  MEMORY LOSS (ICD-780.93) follow-up with Neurology.  Other Orders: Influenza Vaccine NON MCR (28413)  Patient Instructions: 1)  Please schedule a follow-up appointment in 3 months, 2 weeks after labs.  Process Orders Check Orders Results:     [facility]: ABN not required for this insurance Tests Sent for requisitioning (June 09, 2009 3:06 PM):     09/07/2009: [facility] -- T-HIV Viral Load [phone] (signed)     09/07/2009: [facility] -- T-Comprehensive Metabolic Panel [[id number]] (signed)     09/07/2009: [facility] -- T-CBC w/Diff [[id number]] (signed)     09/07/2009: [facility] -- T-RPR (Syphilis) [id number] (signed)      Immunizations Administered:  Influenza Vaccine # 1:    Vaccine Type: Fluvax Non-MCR    Site: left deltoid    Mfr: novartis    Dose: 0.5 ml    Route: IM    Given by: Wendall Mola CMA ( AAMA)    Exp. Date: 07/02/2009    Lot #: [id number]    VIS given: 10/25/06 version given June 09, 2009.  Flu Vaccine Consent Questions:    Do you have a history of severe allergic reactions to this vaccine? no  Any prior history of allergic reactions to egg and/or gelatin? no    Do you have a sensitivity to the preservative Thimersol? no    Do you have a past history of Guillan-Barre Syndrome? no    Do you currently have an acute febrile illness? no    Have you ever had a severe reaction to latex? no    Vaccine information given and explained to patient? yes    Are you currently pregnant? no

## 2010-05-03 NOTE — Consult Note (Signed)
Summary: [facility]  [facility]   Imported By: Lanelle Bal 01/31/2010 09:06:34  _____________________________________________________________________  External Attachment:    Type:   Image     Comment:   External Document

## 2010-05-03 NOTE — Consult Note (Signed)
Summary: [facility]   Imported By: Lanelle Bal 11/26/2009 09:38:21  _____________________________________________________________________  External Attachment:    Type:   Image     Comment:   External Document

## 2010-05-03 NOTE — Progress Notes (Signed)
Summary: orders/TY   Process Orders Check Orders Results:     [facility]: ABN not required for this insurance Tests Sent for requisitioning (May 12, 2009 2:06 PM):     05/12/2009: [facility] -- T-CBC w/Diff [[id number]] (signed)     05/12/2009: [facility] -- T-Comprehensive Metabolic Panel [[id number]] (signed)     05/12/2009: [facility] -- T-HIV Viral Load [phone] (signed)

## 2010-05-03 NOTE — Assessment & Plan Note (Signed)
Summary: HIGH BLD PRESSURE/RH......   Vital Signs:  Patient profile:   50 year old female Menstrual status:  regular Weight:      140.8 pounds BMI:     23.16 Temp:     97.8 degrees F oral Pulse rate:   96 / minute Pulse rhythm:   regular BP sitting:   148 / 96  Vitals Entered By: Doristine Devoid CMA (January 07, 2010 3:05 PM) CC: elevated bp   History of Present Illness: Pt here c/o elevated bp and headaches for the last few days -- no chest pain.    Current Medications (verified): 1)  Albuterol 90 Mcg/act  Aers (Albuterol) .... Inhale 2 Puffs Four Times A Day As Needed 2)  Lyrica 200 Mg  Caps (Pregabalin) .... 2 By Mouth Two Times A Day 3)  Effexor Xr 150 Mg Xr24h-Cap (Venlafaxine Hcl) .... 2 Tabs By Mouth Daily 4)  Ativan 0.5 Mg Tabs (Lorazepam) .Marland Kitchen.. 1 By Mouth Three Times A Day As Needed 5)  Truvada 200-300 Mg Tabs (Emtricitabine-Tenofovir) .... Take 1 Tablet By Mouth Once A Day 6)  Reyataz 300 Mg Caps (Atazanavir Sulfate) .... Take 1 Tablet By Mouth Once A Day 7)  Atenolol 50 Mg Tabs (Atenolol) .... Take 1 Tablet By Mouth Once A Day 8)  Norvir 100 Mg Tabs (Ritonavir) .... Take 1 Tablet By Mouth Once A Day 9)  Aspir-Low 81 Mg Tbec (Aspirin) .Marland Kitchen.. 1 By Mouth Once Daily 10)  Atenolol 100 Mg Tabs (Atenolol) .Marland Kitchen.. 1 By Mouth Once Daily  Allergies (verified): No Known Drug Allergies  Past History:  Past Surgical History: Last updated: 12/13/2006 Denies surgical history  Family History: Last updated: 12/13/2006 M-  MVP, hyperlipidemia Family History of CAD Female 1st degree relative at 53-- father PGF--DM, CAD Family History High cholesterol Family History of Anxiety Family History Depression  Social History: Last updated: 12/13/2006 unemployed Married- separated Current Smoker Alcohol use-yes Drug use-yes Regular exercise-yes  Risk Factors: Alcohol Use: 0 (01/03/2010) Caffeine Use: soda (01/03/2010) Exercise: yes (01/03/2010)  Risk Factors: Smoking Status:  current (01/03/2010) Packs/Day: 1.0 (01/03/2010) Passive Smoke Exposure: no (01/03/2010)  Past Medical History: Asthma Depression/ bipolar Hypertension  Family History: Reviewed history from 12/13/2006 and no changes required. M-  MVP, hyperlipidemia Family History of CAD Female 1st degree relative at 44-- father PGF--DM, CAD Family History High cholesterol Family History of Anxiety Family History Depression  Social History: Reviewed history from 12/13/2006 and no changes required. unemployed Married- separated Current Smoker Alcohol use-yes Drug use-yes Regular exercise-yes  Review of Systems      See HPI  Physical Exam  General:  Well-developed,well-nourished,in no acute distress; alert,appropriate and cooperative throughout examination Lungs:  Normal respiratory effort, chest expands symmetrically. Lungs are clear to auscultation, no crackles or wheezes. Heart:  Normal rate and regular rhythm. S1 and S2 normal without gallop, murmur, click, rub or other extra sounds. Extremities:  No clubbing, cyanosis, edema, or deformity noted with normal full range of motion of all joints.   Cervical Nodes:  No lymphadenopathy noted Psych:  Cognition and judgment appear intact. Alert and cooperative with normal attention span and concentration. No apparent delusions, illusions, hallucinations   Impression & Recommendations:  Problem # 1:  HYPERTENSION (ICD-401.9)  The following medications were removed from the medication list:    Atenolol 50 Mg Tabs (Atenolol) .Marland Kitchen... Take 1 tablet by mouth once a day Her updated medication list for this problem includes:    Atenolol 100 Mg Tabs (Atenolol) .Marland KitchenMarland KitchenMarland KitchenMarland Kitchen  1 by mouth once daily  BP today: 148/96 Prior BP: 170/105 (01/03/2010)  Labs Reviewed: K+: 3.7 (12/20/2009) Creat: : 0.72 (12/20/2009)   Chol: 201 (12/20/2009)   HDL: 54 (12/20/2009)   LDL: 134 (12/20/2009)   TG: 67 (12/20/2009)  Problem # 2:  SINUSITIS, CHRONIC  (ICD-473.9)  Orders: ENT Referral (ENT)  Complete Medication List: 1)  Albuterol 90 Mcg/act Aers (Albuterol) .... Inhale 2 puffs four times a day as needed 2)  Lyrica 200 Mg Caps (Pregabalin) .... 2 by mouth two times a day 3)  Effexor Xr 150 Mg Xr24h-cap (Venlafaxine hcl) .... 2 tabs by mouth daily 4)  Ativan 0.5 Mg Tabs (Lorazepam) .Marland Kitchen.. 1 by mouth three times a day as needed 5)  Truvada 200-300 Mg Tabs (Emtricitabine-tenofovir) .... Take 1 tablet by mouth once a day 6)  Reyataz 300 Mg Caps (Atazanavir sulfate) .... Take 1 tablet by mouth once a day 7)  Norvir 100 Mg Tabs (Ritonavir) .... Take 1 tablet by mouth once a day 8)  Aspir-low 81 Mg Tbec (Aspirin) .Marland Kitchen.. 1 by mouth once daily 9)  Atenolol 100 Mg Tabs (Atenolol) .Marland Kitchen.. 1 by mouth once daily  Patient Instructions: 1)  Please schedule a follow-up appointment in 2 weeks.  Prescriptions: ATENOLOL 100 MG TABS (ATENOLOL) 1 by mouth once daily  #30 x 2   Entered and Authorized by:   Loreen Freud DO   Signed by:   Loreen Freud DO on 01/07/2010   Method used:   Electronically to        [facility]* (retail)       [address] [place], [place]  [address]       Ph: [phone] or [phone]       Fax: [phone]   RxID:   [id number]

## 2010-05-03 NOTE — Miscellaneous (Signed)
Summary: [facility] PV-needs OV  Clinical Lists Changes    Pt is on multiple pain meds and anti-anxiety drugs.  Colonoscopy cancelled and new patient appointment scheduled with Dr. Leone Payor.

## 2010-05-03 NOTE — Assessment & Plan Note (Signed)
Summary: DEPRESSION/ANXIETY//KN   Vital Signs:  Patient profile:   50 year old female Menstrual status:  regular Height:      65.5 inches Weight:      141 pounds Pulse rate:   76 / minute Resp:     18 per minute BP sitting:   138 / 90  (left arm)  Vitals Entered By: Jeremy Johann CMA (October 15, 2009 9:00 AM) CC: discuss depression, meds Comments REVIEWED MED LIST, PATIENT AGREED DOSE AND INSTRUCTION CORRECT    History of Present Illness: Pt is here to f/u anxiety.  She lost her house andm[facility]  She needs a new psychiatrist.  Pt was seeing [facility]  Her anxiety is not well controlled.  She is having panic attacks occassionally.  Current Medications (verified): 1)  Albuterol 90 Mcg/act  Aers (Albuterol) .... Inhale 2 Puffs Four Times A Day As Needed 2)  Lyrica 200 Mg  Caps (Pregabalin) .... 2 By Mouth Two Times A Day 3)  Effexor Xr 150 Mg Xr24h-Cap (Venlafaxine Hcl) .... 2 Tabs By Mouth Daily 4)  Wellbutrin Sr 150 Mg Xr12h-Tab (Bupropion Hcl) .... Take 1 By Mouth Two Times A Day 5)  Ferrous Sulfate 325 (65 Fe) Mg Tabs (Ferrous Sulfate) .Marland Kitchen.. 1 By Mouth Three Times A Day 6)  Depakote Er 500 Mg Xr24h-Tab (Divalproex Sodium) .... Take 2 Tabs At Bedtime 7)  Ativan 0.5 Mg Tabs (Lorazepam) .Marland Kitchen.. 1 By Mouth Three Times A Day As Needed  Allergies (verified): No Known Drug Allergies  Past History:  Past medical, surgical, family and social histories (including risk factors) reviewed for relevance to current acute and chronic problems.  Past Medical History: Reviewed history from 12/13/2006 and no changes required. Asthma Depression/ bipolar  Past Surgical History: Reviewed history from 12/13/2006 and no changes required. Denies surgical history  Family History: Reviewed history from 12/13/2006 and no changes required. M-  MVP, hyperlipidemia Family History of CAD Female 1st degree relative at 88-- father PGF--DM, CAD Family History High  cholesterol Family History of Anxiety Family History Depression  Social History: Reviewed history from 12/13/2006 and no changes required. unemployed Married- separated Current Smoker Alcohol use-yes Drug use-yes Regular exercise-yes  Review of Systems      See HPI  Physical Exam  General:  Well-developed,well-nourished,in no acute distress; alert,appropriate and cooperative throughout examination Neck:  No deformities, masses, or tenderness noted. Heart:  Normal rate and regular rhythm. S1 and S2 normal without gallop, murmur, click, rub or other extra sounds. Psych:  Oriented X3, normally interactive, and slightly anxious.     Impression & Recommendations:  Problem # 1:  ANXIETY STATE, UNSPECIFIED (ICD-300.00)  Pt has moved to [place] and needs a new psychiatrist Her updated medication list for this problem includes:    Effexor Xr 150 Mg Xr24h-cap (Venlafaxine hcl) .Marland Kitchen... 2 tabs by mouth daily    Wellbutrin Sr 150 Mg Xr12h-tab (Bupropion hcl) .Marland Kitchen... Take 1 by mouth two times a day    Ativan 0.5 Mg Tabs (Lorazepam) .Marland Kitchen... 1 by mouth three times a day as needed  Discussed medication use and relaxation techniques.   Orders: Psychiatric Referral (Psych)  Complete Medication List: 1)  Albuterol 90 Mcg/act Aers (Albuterol) .... Inhale 2 puffs four times a day as needed 2)  Lyrica 200 Mg Caps (Pregabalin) .... 2 by mouth two times a day 3)  Effexor Xr 150 Mg Xr24h-cap (Venlafaxine hcl) .... 2 tabs by mouth daily 4)  Wellbutrin Sr 150 Mg  Xr12h-tab (Bupropion hcl) .... Take 1 by mouth two times a day 5)  Ferrous Sulfate 325 (65 Fe) Mg Tabs (Ferrous sulfate) .Marland Kitchen.. 1 by mouth three times a day 6)  Depakote Er 500 Mg Xr24h-tab (Divalproex sodium) .... Take 2 tabs at bedtime 7)  Ativan 0.5 Mg Tabs (Lorazepam) .Marland Kitchen.. 1 by mouth three times a day as needed Prescriptions: ATIVAN 0.5 MG TABS (LORAZEPAM) 1 by mouth three times a day as needed  #60 x 0   Entered and Authorized by:    Loreen Freud DO   Signed by:   Loreen Freud DO on 10/15/2009   Method used:   Print then Give to Patient   RxID:   [id number]

## 2010-05-03 NOTE — Progress Notes (Signed)
Summary: Latoya Wilson REFILL  Phone Note Refill Request Message from:  Fax from Pharmacy on December 15, 2009 11:10 AM  Refills Requested: Medication #1:  ATIVAN 0.5 MG TABS 1 by mouth three times a day as needed.  Medication #2:  EFFEXOR XR 150 MG XR24H-CAP 2 tabs by mouth daily [facility], [address], [place]    FAX  [phone]     ***NOTE** EARLIER PRESCRIPTION FOR ATIVAN WENT TO [facility], NOT [facility]  Initial call taken by: Jerolyn Shin,  December 15, 2009 11:12 AM  Follow-up for Phone Call        RF request Effexor last filled 11/10/09, Ativan filled 12/13/09, last OV 10/25/09 PLease advise on the Effexor. Follow-up by: Almeta Monas CMA Duncan Dull),  December 15, 2009 1:56 PM  Additional Follow-up for Phone Call Additional follow up Details #1::        ok to refill effexor for 6 months Additional Follow-up by: Loreen Freud DO,  December 15, 2009 2:01 PM    Additional Follow-up for Phone Call Additional follow up Details #2::    rx faxed back to pharmacy. Spk with pharmacist at [facility] and they adv that the [place]  [facility] is closed and Rx not in their system, says we will have to refax the Ativan. Printed to be faxed to [facility] pharmacy Follow-up by: Almeta Monas CMA Duncan Dull),  December 15, 2009 2:11 PM  Prescriptions: ATIVAN 0.5 MG TABS (LORAZEPAM) 1 by mouth three times a day as needed  #60 x 0   Entered by:   Almeta Monas CMA (AAMA)   Authorized by:   Loreen Freud DO   Signed by:   Almeta Monas CMA (AAMA) on 12/15/2009   Method used:   Printed then faxed to ...       [facility] (retail)       [address]       [place], [place]  [address]       Ph: [phone]       Fax: [phone]   RxID:   [id number] EFFEXOR XR 150 MG XR24H-CAP (VENLAFAXINE HCL) 2 tabs by mouth daily  #60 x 6   Entered by:   Almeta Monas CMA (AAMA)   Authorized by:   Loreen Freud DO   Signed by:   Almeta Monas  CMA (AAMA) on 12/15/2009   Method used:   Faxed to ...       [facility] (retail)       [address]       [place], [place]  [address]       Ph: [phone]       Fax: [phone]   RxID:   [id number]

## 2010-05-03 NOTE — Progress Notes (Signed)
Summary: Medication Concerns-pt out of leffexor  Phone Note Call from Patient Call back at Home Phone [phone]   Caller: Patient Summary of Call: Message left on Triage VM: Patient moved from [facility] to [facility], which means she can no longer go to [employer]. Patient has a pending appointment for an office in [facility] on 01/11/10 and was told her primary would have to rx meds (Effexor & Lorazepam) until appointment.   I called patient and left a message on her VM informing her that Dr.Lowne is out of the office until Tuesday-? if she has enough meds to last until then./Chrae [facility] CMA  November 10, 2009 3:30 PM   Follow-up for Phone Call        patient is out of effexor 150 mg twice a day .Okey Regal [place]  November 10, 2009 3:49 PM   Additional Follow-up for Phone Call Additional follow up Details #1::        Lorazepam last ov 10-25-09, last filled 10-15-09 #60 Effexor never filled by dr Laury Axon..pls advise................Marland KitchenFelecia Deloach CMA  November 10, 2009 4:08 PM     Additional Follow-up for Phone Call Additional follow up Details #2::    ok for Effexor, #60, no refills.  ok for Ativan #60, no refills Follow-up by: Neena Rhymes MD,  November 10, 2009 4:13 PM  Additional Follow-up for Phone Call Additional follow up Details #3:: Details for Additional Follow-up Action Taken: pt aware rx sent to pharmacy..................Marland KitchenFelecia Deloach CMA  November 10, 2009 4:26 PM   Prescriptions: ATIVAN 0.5 MG TABS (LORAZEPAM) 1 by mouth three times a day as needed  #60 x 0   Entered by:   Jeremy Johann CMA   Authorized by:   Neena Rhymes MD   Signed by:   Jeremy Johann CMA on 11/10/2009   Method used:   Printed then faxed to ...       [facility] (retail)       [address]       [place], [place]  [address]       Ph: [phone]       Fax: [phone]   RxID:   [id number] EFFEXOR XR 150 MG XR24H-CAP (VENLAFAXINE HCL) 2 tabs by  mouth daily  #60 x 0   Entered by:   Jeremy Johann CMA   Authorized by:   Neena Rhymes MD   Signed by:   Jeremy Johann CMA on 11/10/2009   Method used:   Faxed to ...       [facility] (retail)       [address]       [place], [place]  [address]       Ph: [phone]       Fax: [phone]   RxID:   [id number]

## 2010-05-03 NOTE — Progress Notes (Signed)
Summary: L-spine results  Phone Note Outgoing Call   Call placed by: Almeta Monas CMA Duncan Dull),  January 24, 2010 10:42 AM Call placed to: Patient Details for Reason: L-spine report Summary of Call: nothing acute---refer to ortho if symptoms do not improve........Marland Kitchen    Left message to call back.Marland KitchenMarland KitchenMarland KitchenAlmeta Monas CMA Duncan Dull)  January 24, 2010 10:42 AM left message to call office.................Marland KitchenFelecia Deloach CMA  January 24, 2010 3:33 PM   discuss with patient, pt to call if symptoms no better for ortho referral..............Marland KitchenFelecia Deloach CMA  January 25, 2010 9:12 AM

## 2010-05-03 NOTE — Assessment & Plan Note (Signed)
Summary: PAP SMEAR/VS   Vital Signs:  Patient profile:   50 year old female Menstrual status:  regular LMP:     05/23/2009  Vitals Entered By: Jennet Maduro RN (June 11, 2009 1:54 PM) CC: PAP smear visit.  Pt. given condoms.  Pt. given educational materials re:  HIV and women, diet, exercise, BSE, Nutrition and self-estreem.  LMP (date): 05/23/2009     Enter LMP: 05/23/2009 Last PAP Result NORMAL   Medical History  Evaluation and Follow-Up  Prevention For Positives: 06/11/2009   Safe sex practices discussed with patient. Condoms offered. Prior Medications: ALBUTEROL 90 MCG/ACT  AERS (ALBUTEROL) inhale 2 puffs four times a day as needed LYRICA 200 MG  CAPS (PREGABALIN) 2 by mouth two times a day REYATAZ 300 MG  CAPS (ATAZANAVIR SULFATE) Take 1 tablet by mouth once a day NORVIR 100 MG  CAPS (RITONAVIR) Take 1 tablet by mouth once a day TRUVADA 200-300 MG  TABS (EMTRICITABINE-TENOFOVIR) Take 1 tablet by mouth once a day EFFEXOR XR 150 MG XR24H-CAP (VENLAFAXINE HCL) 2 tabs by mouth daily WELLBUTRIN 75 MG TABS (BUPROPION HCL) 1 tab by mouth twice a day FERROUS SULFATE 325 (65 FE) MG TABS (FERROUS SULFATE) 1 by mouth three times a day Current Allergies: No known allergies  Orders Added: 1)  Est. Patient Level I [[address]] 2)  T-PAP [facility]) [[id number]]             Prevention For Positives: 06/11/2009   Safe sex practices discussed with patient. Condoms offered.

## 2010-05-04 ENCOUNTER — Encounter: Payer: Self-pay | Admitting: Family Medicine

## 2010-05-05 ENCOUNTER — Telehealth: Payer: Self-pay | Admitting: Family Medicine

## 2010-05-05 NOTE — Assessment & Plan Note (Signed)
Summary: BILATERAL LEG PAIN, RT LEG WORSE/RH......   Vital Signs:  Patient profile:   50 year old female Menstrual status:  regular Weight:      153.2 pounds Temp:     97.8 degrees F oral BP sitting:   150 / 100  (left arm) Cuff size:   regular  Vitals Entered By: Almeta Monas CMA Duncan Dull) (March 15, 2010 2:59 PM)  Serial Vital Signs/Assessments:  Time      Position  BP       Pulse  Resp  Temp     By 3:29 PM             134/86                         Almeta Monas CMA (AAMA)  CC: c/o pain that starts in the tailbone and goes down the right leg, Back Pain   History of Present Illness:       This is a 50 year old woman who presents with Back Pain.  The symptoms began 1 week ago.  Pt c/o R buttock and pain goes down both legs.  Pt seen [facility] and pt was given hydrocodone and f/u here.  The patient denies fever, chills, weakness, loss of sensation, fecal incontinence, urinary incontinence, urinary retention, dysuria, rest pain, inability to work, and inability to care for self.  The pain is located in the right SI joint.  The pain began at home, gradually, after lifting, and after overuse.  The pain radiates to the right leg below the knee and left leg below the knee.  The pain is made worse by lying down, standing or walking, and inactivity.  The pain is made better by activity, opioids, and heat.    Current Medications (verified): 1)  Albuterol 90 Mcg/act  Aers (Albuterol) .... Inhale 2 Puffs Four Times A Day As Needed 2)  Lyrica 200 Mg  Caps (Pregabalin) .... 2 By Mouth Two Times A Day 3)  Effexor Xr 150 Mg Xr24h-Cap (Venlafaxine Hcl) .... 2 Tabs By Mouth Daily 4)  Ativan 0.5 Mg Tabs (Lorazepam) .Marland Kitchen.. 1 By Mouth Three Times A Day As Needed 5)  Truvada 200-300 Mg Tabs (Emtricitabine-Tenofovir) .... Take 1 Tablet By Mouth Once A Day 6)  Reyataz 300 Mg Caps (Atazanavir Sulfate) .... Take 1 Tablet By Mouth Once A Day 7)  Norvir 100 Mg Tabs (Ritonavir) .... Take 1 Tablet By Mouth  Once A Day 8)  Aspir-Low 81 Mg Tbec (Aspirin) .Marland Kitchen.. 1 By Mouth Once Daily 9)  Atenolol 100 Mg Tabs (Atenolol) .Marland Kitchen.. 1 By Mouth Once Daily 10)  Vicodin Es 7.5-750 Mg Tabs (Hydrocodone-Acetaminophen) .Marland Kitchen.. 1 By Mouth Every 6 Hours As Needed 11)  Depakote 250 Mg Tbec (Divalproex Sodium) .... Take 1 Tablet By Mouth Once A Day 12)  Flexeril 10 Mg Tabs (Cyclobenzaprine Hcl) .Marland Kitchen.. 1 By Mouth Three Times A Day As Needed  Allergies (verified): No Known Drug Allergies  Past History:  Past Medical History: Last updated: 01/07/2010 Asthma Depression/ bipolar Hypertension  Past Surgical History: Last updated: 12/13/2006 Denies surgical history  Family History: Last updated: 12/13/2006 M-  MVP, hyperlipidemia Family History of CAD Female 1st degree relative at 58-- father PGF--DM, CAD Family History High cholesterol Family History of Anxiety Family History Depression  Social History: Last updated: 12/13/2006 unemployed Married- separated Current Smoker Alcohol use-yes Drug use-yes Regular exercise-yes  Risk Factors: Alcohol Use: 0 (03/04/2010) Caffeine Use: soda (03/04/2010) Exercise:  yes (03/04/2010)  Risk Factors: Smoking Status: current (03/04/2010) Packs/Day: 1.0 (03/04/2010) Passive Smoke Exposure: no (03/04/2010)  Family History: Reviewed history from 12/13/2006 and no changes required. M-  MVP, hyperlipidemia Family History of CAD Female 1st degree relative at 59-- father PGF--DM, CAD Family History High cholesterol Family History of Anxiety Family History Depression  Social History: Reviewed history from 12/13/2006 and no changes required. unemployed Married- separated Current Smoker Alcohol use-yes Drug use-yes Regular exercise-yes  Review of Systems      See HPI  Physical Exam  General:  Well-developed,well-nourished,in no acute distress; alert,appropriate and cooperative throughout examination Lungs:  Normal respiratory effort, chest expands  symmetrically. Lungs are clear to auscultation, no crackles or wheezes. Heart:  Normal rate and regular rhythm. S1 and S2 normal without gallop, murmur, click, rub or other extra sounds. Msk:  normal ROM, no joint tenderness, no joint swelling, no joint warmth, no redness over joints, no joint deformities, no joint instability, and no crepitation.   Neurologic:  alert & oriented X3, gait normal, and DTRs symmetrical and normal.   slight weakness as compared to Left in R low leg with ext  Skin:  Intact without suspicious lesions or rashes Cervical Nodes:  No lymphadenopathy noted Psych:  Cognition and judgment appear intact. Alert and cooperative with normal attention span and concentration. No apparent delusions, illusions, hallucinations   Impression & Recommendations:  Problem # 1:  BACK PAIN, LUMBAR, WITH RADICULOPATHY (ICD-724.4)  Her updated medication list for this problem includes:    Aspir-low 81 Mg Tbec (Aspirin) .Marland Kitchen... 1 by mouth once daily    Vicodin Es 7.5-750 Mg Tabs (Hydrocodone-acetaminophen) .Marland Kitchen... 1 by mouth every 6 hours as needed    Flexeril 10 Mg Tabs (Cyclobenzaprine hcl) .Marland Kitchen... 1 by mouth three times a day as needed  Orders: Radiology Referral (Radiology)  Discussed use of moist heat or ice, modified activities, medications, and stretching/strengthening exercises. Back care instructions given. To be seen in 2 weeks if no improvement; sooner if worsening of symptoms.   Problem # 2:  HYPERTENSION (ICD-401.9)  Her updated medication list for this problem includes:    Atenolol 100 Mg Tabs (Atenolol) .Marland Kitchen... 1 by mouth once daily  BP today: 150/100 Prior BP: 145/94 (03/04/2010)  Labs Reviewed: K+: 4.8 (02/16/2010) Creat: : 0.85 (02/16/2010)   Chol: 201 (12/20/2009)   HDL: 54 (12/20/2009)   LDL: 134 (12/20/2009)   TG: 67 (12/20/2009)  Complete Medication List: 1)  Albuterol 90 Mcg/act Aers (Albuterol) .... Inhale 2 puffs four times a day as needed 2)  Lyrica 200 Mg  Caps (Pregabalin) .... 2 by mouth two times a day 3)  Effexor Xr 150 Mg Xr24h-cap (Venlafaxine hcl) .... 2 tabs by mouth daily 4)  Ativan 0.5 Mg Tabs (Lorazepam) .Marland Kitchen.. 1 by mouth three times a day as needed 5)  Truvada 200-300 Mg Tabs (Emtricitabine-tenofovir) .... Take 1 tablet by mouth once a day 6)  Reyataz 300 Mg Caps (Atazanavir sulfate) .... Take 1 tablet by mouth once a day 7)  Norvir 100 Mg Tabs (Ritonavir) .... Take 1 tablet by mouth once a day 8)  Aspir-low 81 Mg Tbec (Aspirin) .Marland Kitchen.. 1 by mouth once daily 9)  Atenolol 100 Mg Tabs (Atenolol) .Marland Kitchen.. 1 by mouth once daily 10)  Vicodin Es 7.5-750 Mg Tabs (Hydrocodone-acetaminophen) .Marland Kitchen.. 1 by mouth every 6 hours as needed 11)  Depakote 250 Mg Tbec (Divalproex sodium) .... Take 1 tablet by mouth once a day 12)  Flexeril 10 Mg Tabs (Cyclobenzaprine  hcl) .... 1 by mouth three times a day as needed Prescriptions: FLEXERIL 10 MG TABS (CYCLOBENZAPRINE HCL) 1 by mouth three times a day as needed  #30 x 0   Entered and Authorized by:   Loreen Freud DO   Signed by:   Loreen Freud DO on 03/15/2010   Method used:   Faxed to ...       [facility] (retail)       [address]       [place], [place]  [address]       Ph: [phone]       Fax: [phone]   RxID:   [id number] VICODIN ES 7.5-750 MG TABS (HYDROCODONE-ACETAMINOPHEN) 1 by mouth every 6 hours as needed  #30 x 0   Entered and Authorized by:   Loreen Freud DO   Signed by:   Loreen Freud DO on 03/15/2010   Method used:   Print then Give to Patient   RxID:   [id number]    Orders Added: 1)  Radiology Referral [Radiology] 2)  Est. Patient Level III [474[address]]

## 2010-05-05 NOTE — Letter (Signed)
Summary: Primary Care Consult Scheduled Letter  [facility] [place]  [address] [place], [place] [address]   Phone: [phone]  Fax: [phone]      04/29/2010 MRN: [id number]  [facility] [address] [place], [place]  [address]    Dear Ms. Radcliffe,    We have scheduled an appointment for you.  At the recommendation of Dr. Loreen Freud, we have scheduled you a consult with [facility] on 05-19-2010 at 2:30pm.  Their address is [facility], [address] [place], [place] [address].  The office phone number is [phone].  If this appointment day and time is not convenient for you, please feel free to call the office of the doctor you are being referred to at the number listed above and reschedule the appointment.    It is important for you to keep your scheduled appointments. We are here to make sure you are given good patient care.   Thank you,    Renee, Patient Care Coordinator [facility]

## 2010-05-05 NOTE — Letter (Signed)
Summary: [facility] Instructions  [facility]  [address] [place], [place] [address]   Phone: [phone]  Fax: [phone]       Latoya Wilson    1960-06-14    MRN: [id number]        Procedure Day /Date: 04/05/10 Tuesday     Arrival Time: 9:00 am     Procedure Time: 10:00 am     Location of Procedure:                    _x _  [facility] (4th Floor)  PREPARATION FOR COLONOSCOPY WITH MOVIPREP   Starting 5 days prior to your procedure 03/31/10 do not eat nuts, seeds, popcorn, corn, beans, peas,  salads, or any raw vegetables.  Do not take any fiber supplements (e.g. Metamucil, Citrucel, and Benefiber).  THE DAY BEFORE YOUR PROCEDURE         DATE: 04/04/10  DAY: Monday  1.  Drink clear liquids the entire day-NO SOLID FOOD  2.  Do not drink anything colored red or purple.  Avoid juices with pulp.  No orange juice.  3.  Drink at least 64 oz. (8 glasses) of fluid/clear liquids during the day to prevent dehydration and help the prep work efficiently.  CLEAR LIQUIDS INCLUDE: Water Jello Ice Popsicles Tea (sugar ok, no milk/cream) Powdered fruit flavored drinks Coffee (sugar ok, no milk/cream) Gatorade Juice: apple, white grape, white cranberry  Lemonade Clear bullion, consomm, broth Carbonated beverages (any kind) Strained chicken noodle soup Hard Candy                             4.  In the morning, mix first dose of MoviPrep solution:    Empty 1 Pouch A and 1 Pouch B into the disposable container    Add lukewarm drinking water to the top line of the container. Mix to dissolve    Refrigerate (mixed solution should be used within 24 hrs)  5.  Begin drinking the prep at 5:00 p.m. The MoviPrep container is divided by 4 marks.   Every 15 minutes drink the solution down to the next mark (approximately 8 oz) until the full liter is complete.   6.  Follow completed prep with 16 oz of clear liquid of your choice (Nothing red or purple).  Continue  to drink clear liquids until bedtime.  7.  Before going to bed, mix second dose of MoviPrep solution:    Empty 1 Pouch A and 1 Pouch B into the disposable container    Add lukewarm drinking water to the top line of the container. Mix to dissolve    Refrigerate  THE DAY OF YOUR PROCEDURE      DATE: 04/05/10 DAY: Tuesday  Beginning at 5:00 a.m. (5 hours before procedure):         1. Every 15 minutes, drink the solution down to the next mark (approx 8 oz) until the full liter is complete.  2. Follow completed prep with 16 oz. of clear liquid of your choice.    3. You may drink clear liquids until 8:00 am (2 HOURS BEFORE PROCEDURE).   MEDICATION INSTRUCTIONS  Unless otherwise instructed, you should take regular prescription medications with a small sip of water   as early as possible the morning of your procedure.  PLEASE MAKE SURE TO BRING YOUR INHALER FOR YOUR COLONOSCOPY!!!!        OTHER INSTRUCTIONS  You will need a responsible adult at least 51 years of age to accompany you and drive you home.   This person must remain in the waiting room during your procedure.  Wear loose fitting clothing that is easily removed.  Leave jewelry and other valuables at home.  However, you may wish to bring a book to read or  an iPod/MP3 player to listen to music as you wait for your procedure to start.  Remove all body piercing jewelry and leave at home.  Total time from sign-in until discharge is approximately 2-3 hours.  You should go home directly after your procedure and rest.  You can resume normal activities the  day after your procedure.  The day of your procedure you should not:   Drive   Make legal decisions   Operate machinery   Drink alcohol   Return to work  You will receive specific instructions about eating, activities and medications before you leave.    The above instructions have been reviewed and explained to me by  Latoya Wilson CMA Latoya Wilson)   March 30, 2010 2:16 PM _    I fully understand and can verbalize these instructions _____________________________ Date 03/30/10

## 2010-05-05 NOTE — Assessment & Plan Note (Signed)
Summary: 3 MONTH OV//PH   Vital Signs:  Patient profile:   50 year old female Menstrual status:  regular Weight:      164.2 pounds Pulse rate:   80 / minute Pulse rhythm:   irregular BP sitting:   132 / 90  (right arm) Cuff size:   regular  Vitals Entered By: Almeta Monas CMA Duncan Dull) (April 25, 2010 1:56 PM)  Serial Vital Signs/Assessments:  Time      Position  BP       Pulse  Resp  Temp     By                     130/90                         Loreen Freud DO  CC: 3 mo f/u   History of Present Illness:  Hypertension follow-up      This is a 50 year old woman who presents for Hypertension follow-up.  The patient denies lightheadedness, urinary frequency, headaches, edema, impotence, rash, and fatigue.  The patient denies the following associated symptoms: chest pain, chest pressure, exercise intolerance, dyspnea, palpitations, syncope, leg edema, and pedal edema.  Compliance with medications (by patient report) has been near 100%.  The patient reports that dietary compliance has been good.  The patient reports no exercise.  Adjunctive measures currently used by the patient include salt restriction.    Current Medications (verified): 1)  Albuterol 90 Mcg/act  Aers (Albuterol) .... Inhale 2 Puffs Four Times A Day As Needed 2)  Lyrica 200 Mg  Caps (Pregabalin) .... 2 By Mouth Two Times A Day 3)  Effexor Xr 150 Mg Xr24h-Cap (Venlafaxine Hcl) .... 2 Tabs By Mouth Daily 4)  Ativan 0.5 Mg Tabs (Lorazepam) .Marland Kitchen.. 1 By Mouth Three Times A Day As Needed 5)  Truvada 200-300 Mg Tabs (Emtricitabine-Tenofovir) .... Take 1 Tablet By Mouth Once A Day 6)  Reyataz 300 Mg Caps (Atazanavir Sulfate) .... Take 1 Tablet By Mouth Once A Day 7)  Norvir 100 Mg Tabs (Ritonavir) .... Take 1 Tablet By Mouth Once A Day 8)  Aspir-Low 81 Mg Tbec (Aspirin) .Marland Kitchen.. 1 By Mouth Once Daily 9)  Atenolol 100 Mg Tabs (Atenolol) .Marland Kitchen.. 1 By Mouth Two Times A Day 10)  Vicodin Es 7.5-750 Mg Tabs (Hydrocodone-Acetaminophen)  .Marland Kitchen.. 1 By Mouth Every 6 Hours As Needed 11)  Depakote 250 Mg Tbec (Divalproex Sodium) .... Take 3  Tablet By Mouth At Bedtime 12)  Moviprep 100 Gm  Solr (Peg-Kcl-Nacl-Nasulf-Na Asc-C) .... As Per Prep Instructions.  Allergies (verified): No Known Drug Allergies  Past History:  Past Medical History: Last updated: 03/30/2010 Asthma Depression/ bipolar Hypertension HIV infection on therapy  Past Surgical History: Last updated: 12/13/2006 Denies surgical history  Family History: Last updated: 03/30/2010 M-  MVP, hyperlipidemia Family History of CAD Female 1st degree relative at 68-- father PGF--DM, CAD Family History High cholesterol Family History of Anxiety Family History Depression No FH of Colon Cancer:  Social History: Last updated: 03/30/2010 unemployed Married- separated Current Smoker  1 PPD Alcohol use-no  Drug use-no  Regular exercise-yes Daily Caffeine Use  Dr Reino [place]  Risk Factors: Alcohol Use: 0 (03/04/2010) Caffeine Use: soda (03/04/2010) Exercise: yes (03/04/2010)  Risk Factors: Smoking Status: current (03/04/2010) Packs/Day: 1.0 (03/04/2010) Passive Smoke Exposure: no (03/04/2010)  Family History: Reviewed history from 03/30/2010 and no changes required. M-  MVP, hyperlipidemia Family History of CAD Female  1st degree relative at 89-- father PGF--DM, CAD Family History High cholesterol Family History of Anxiety Family History Depression No FH of Colon Cancer:  Social History: Reviewed history from 03/30/2010 and no changes required. unemployed Married- separated Current Smoker  1 PPD Alcohol use-no  Drug use-no  Regular exercise-yes Daily Caffeine Use  Dr Reino [place]  Review of Systems      See HPI  Physical Exam  General:  Well-developed,well-nourished,in no acute distress; alert,appropriate and cooperative throughout examination Lungs:  Normal respiratory effort, chest expands symmetrically. Lungs are clear to auscultation, no crackles or  wheezes. Heart:  normal rate and no murmur.   Extremities:  No clubbing, cyanosis, edema, or deformity noted with normal full range of motion of all joints.   Psych:  Oriented X3 and normally interactive.     Impression & Recommendations:  Problem # 1:  HYPERTENSION (ICD-401.9)  Her updated medication list for this problem includes:    Atenolol 100 Mg Tabs (Atenolol) .Marland Kitchen... 1 by mouth two times a day  BP today: 132/90 Prior BP: 130/90 (03/30/2010)  Labs Reviewed: K+: 4.8 (02/16/2010) Creat: : 0.85 (02/16/2010)   Chol: 201 (12/20/2009)   HDL: 54 (12/20/2009)   LDL: 134 (12/20/2009)   TG: 67 (12/20/2009)  Problem # 2:  ADNEXAL MASS, LEFT (ICD-789.39)  Orders: Gynecologic Referral (Gyn)  Complete Medication List: 1)  Albuterol 90 Mcg/act Aers (Albuterol) .... Inhale 2 puffs four times a day as needed 2)  Lyrica 200 Mg Caps (Pregabalin) .... 2 by mouth two times a day 3)  Effexor Xr 150 Mg Xr24h-cap (Venlafaxine hcl) .... 2 tabs by mouth daily 4)  Ativan 0.5 Mg Tabs (Lorazepam) .Marland Kitchen.. 1 by mouth three times a day as needed 5)  Truvada 200-300 Mg Tabs (Emtricitabine-tenofovir) .... Take 1 tablet by mouth once a day 6)  Reyataz 300 Mg Caps (Atazanavir sulfate) .... Take 1 tablet by mouth once a day 7)  Norvir 100 Mg Tabs (Ritonavir) .... Take 1 tablet by mouth once a day 8)  Aspir-low 81 Mg Tbec (Aspirin) .Marland Kitchen.. 1 by mouth once daily 9)  Atenolol 100 Mg Tabs (Atenolol) .Marland Kitchen.. 1 by mouth two times a day 10)  Vicodin Es 7.5-750 Mg Tabs (Hydrocodone-acetaminophen) .Marland Kitchen.. 1 by mouth every 6 hours as needed 11)  Depakote 250 Mg Tbec (Divalproex sodium) .... Take 3  tablet by mouth at bedtime 12)  Moviprep 100 Gm Solr (Peg-kcl-nacl-nasulf-na asc-c) .... As per prep instructions.  Patient Instructions: 1)  Please schedule a follow-up appointment in 2 weeks.  Prescriptions: ATENOLOL 100 MG TABS (ATENOLOL) 1 by mouth two times a day  #60 x 2   Entered and Authorized by:   Loreen Freud DO   Signed  by:   Loreen Freud DO on 04/25/2010   Method used:   Faxed to ...       [facility] (retail)       [address]       [place], [place]  [address]       Ph: [phone]       Fax: [phone]   RxID:   [id number]    Orders Added: 1)  Gynecologic Referral [Gyn] 2)  Est. Patient Level III [[address]]

## 2010-05-05 NOTE — Assessment & Plan Note (Signed)
Summary: sutures in finger/cbs   Vital Signs:  Patient profile:   50 year old female Menstrual status:  regular Weight:      153 pounds Pulse rate:   76 / minute Pulse rhythm:   regular BP sitting:   130 / 82  (right arm) Cuff size:   regular  Vitals Entered By: Almeta Monas CMA Duncan Dull) (March 22, 2010 10:33 AM) CC: laceration to the left index finger   History of Present Illness: Pt lacerated Left index finger pulling emergency brake this am.    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.20) 12)  Restless Legs Syndrome  (ICD-333.94) 13)  Bronchitis  (ICD-490) 14)  Nevus  (ICD-216.9) 15)  Abscess, Tooth  (ICD-522.5) 16)  Increased Blood Pressure  (ICD-796.2) 17)  Lesion, Vagina  (ICD-236.3) 18)  Tendinitis  (ICD-726.90) 19)  HIV Infection  (ICD-042) 20)  Abfnd, Serologic Evidence of HIV  (ICD-795.71) 21)  Sexual Activity, High Risk  (ICD-V69.2) 22)  Hx, Personal, Tobacco Use  (ICD-V15.82) 23)  Anemia, Vitamin B12 Deficiency  (ICD-281.1) 24)  Preventive Health Care  (ICD-V70.0) 25)  Family History Depression  (ICD-V17.0) 26)  Family History of Cad Female 1st Degree Relative <50  (ICD-V17.3) 27)  Muscle Pain  (ICD-729.1) 28)  Periodic Limb Movement Disorder  (ICD-333.99) 29)  Depression  (ICD-311) 30)  Asthma  (ICD-493.90)  Medications Prior to Update: 1)  Albuterol 90 Mcg/act  Aers (Albuterol) .... Inhale 2 Puffs Four Times A Day As Needed 2)  Lyrica 200 Mg  Caps (Pregabalin) .... 2 By Mouth Two  Times A Day 3)  Effexor Xr 150 Mg Xr24h-Cap (Venlafaxine Hcl) .... 2 Tabs By Mouth Daily 4)  Ativan 0.5 Mg Tabs (Lorazepam) .Marland Kitchen.. 1 By Mouth Three Times A Day As Needed 5)  Truvada 200-300 Mg Tabs (Emtricitabine-Tenofovir) .... Take 1 Tablet By Mouth Once A Day 6)  Reyataz 300 Mg Caps (Atazanavir Sulfate) .... Take 1 Tablet By Mouth Once A Day 7)  Norvir 100 Mg Tabs (Ritonavir) .... Take 1 Tablet By Mouth Once A Day 8)  Aspir-Low 81 Mg Tbec (Aspirin) .Marland Kitchen.. 1 By Mouth Once Daily 9)  Atenolol 100 Mg Tabs (Atenolol) .Marland Kitchen.. 1 By Mouth Once Daily 10)  Vicodin Es 7.5-750 Mg Tabs (Hydrocodone-Acetaminophen) .Marland Kitchen.. 1 By Mouth Every 6 Hours As Needed 11)  Depakote 250 Mg Tbec (Divalproex Sodium) .... Take 1 Tablet By Mouth Once A Day 12)  Flexeril 10 Mg Tabs (Cyclobenzaprine Hcl) .Marland Kitchen.. 1 By Mouth Three Times A Day As Needed  Current Medications (verified): 1)  Albuterol 90 Mcg/act  Aers (Albuterol) .... Inhale 2 Puffs Four Times A Day As Needed 2)  Lyrica 200 Mg  Caps (Pregabalin) .... 2 By Mouth Two Times A Day 3)  Effexor Xr 150 Mg Xr24h-Cap (Venlafaxine Hcl) .... 2 Tabs By Mouth Daily 4)  Ativan 0.5 Mg Tabs (Lorazepam) .Marland Kitchen.. 1 By Mouth Three Times A Day As Needed 5)  Truvada 200-300 Mg Tabs (Emtricitabine-Tenofovir) .... Take 1 Tablet By Mouth Once A Day 6)  Reyataz 300 Mg Caps (Atazanavir Sulfate) .... Take 1 Tablet By Mouth Once A Day 7)  Norvir 100 Mg Tabs (Ritonavir) .... Take 1 Tablet By Mouth Once A Day 8)  Aspir-Low 81 Mg Tbec (Aspirin) .Marland Kitchen.. 1 By Mouth Once Daily 9)  Atenolol 100 Mg Tabs (Atenolol) .Marland Kitchen.. 1 By Mouth Once Daily 10)  Vicodin Es 7.5-750 Mg Tabs (Hydrocodone-Acetaminophen) .Marland Kitchen.. 1 By Mouth Every 6 Hours As Needed 11)  Depakote 250 Mg Tbec (Divalproex Sodium) .... Take 1 Tablet By Mouth Once A Day 12)  Flexeril 10 Mg Tabs (Cyclobenzaprine Hcl) .Marland Kitchen.. 1 By Mouth Three Times A Day As Needed  Allergies (verified): No Known Drug Allergies  Family History: Reviewed history from 12/13/2006  and no changes required. M-  MVP, hyperlipidemia Family History of CAD Female 1st degree relative at 72-- father PGF--DM, CAD Family History High cholesterol Family History of Anxiety Family History Depression  Social History: Reviewed history from 12/13/2006 and no changes required. unemployed Married- separated Current Smoker Alcohol use-yes Drug use-yes Regular exercise-yes  Review of Systems      See HPI  Physical Exam  General:  Well-developed,well-nourished,in no acute distress; alert,appropriate and cooperative throughout examination Skin:  L index finger U shaped laceration on palmar surface---no FB, Senosory motor deficit-- cleaned and prepped in sterile fashion 1 cc lidocaine w/o epi for anesthesia ethilon 3.0  x 5  to approximate edges abx ointment and bandage   Impression & Recommendations:  Problem # 1:  LACERATION OF FINGER (ICD-883.0)  rto 5 days   Orders: Lacerat Intermd NHF 0 - 2.5 cm (12041)  Complete Medication List: 1)  Albuterol 90 Mcg/act Aers (Albuterol) .... Inhale 2 puffs four times a day as needed 2)  Lyrica 200 Mg Caps (Pregabalin) .... 2 by mouth two times a day 3)  Effexor Xr 150 Mg Xr24h-cap (Venlafaxine hcl) .... 2 tabs by mouth daily 4)  Ativan 0.5 Mg Tabs (Lorazepam) .Marland Kitchen.. 1 by mouth three times a day as needed 5)  Truvada 200-300 Mg Tabs (Emtricitabine-tenofovir) .... Take 1 tablet by mouth once a day 6)  Reyataz 300 Mg Caps (Atazanavir sulfate) .... Take 1 tablet by mouth once a day 7)  Norvir 100 Mg Tabs (Ritonavir) .... Take 1 tablet by mouth once a day 8)  Aspir-low 81 Mg Tbec (Aspirin) .Marland Kitchen.. 1 by mouth once daily 9)  Atenolol 100 Mg Tabs (Atenolol) .Marland Kitchen.. 1 by mouth once daily 10)  Vicodin Es 7.5-750 Mg Tabs (Hydrocodone-acetaminophen) .Marland Kitchen.. 1 by mouth every 6 hours as needed 11)  Depakote 250 Mg Tbec (Divalproex sodium) .... Take 1 tablet by mouth once a day 12)  Flexeril 10 Mg Tabs (Cyclobenzaprine hcl) .Marland Kitchen.. 1 by mouth three times a  day as needed  Other Orders: Tdap => 72yrs IM (29562) Admin 1st Vaccine (13086)   Orders Added: 1)  Tdap => 68yrs IM [90715] 2)  Admin 1st Vaccine [90471] 3)  Lacerat Intermd NHF 0 - 2.5 cm [12041]   Immunizations Administered:  Tetanus Vaccine:    Vaccine Type: Tdap    Site: right deltoid    Mfr: Merck    Dose: 0.5 ml    Route: IM    Given by: Almeta Monas CMA (AAMA)    Exp. Date: 01/21/2012    Lot #: [id number]    VIS given: 02/19/08 version given March 22, 2010.    Immunizations Administered:  Tetanus Vaccine:    Vaccine Type: Tdap    Site: right deltoid    Mfr:  Merck    Dose: 0.5 ml    Route: IM    Given by: Almeta Monas CMA (AAMA)    Exp. Date: 01/21/2012    Lot #: [id number]    VIS given: 02/19/08 version given March 22, 2010.

## 2010-05-05 NOTE — Assessment & Plan Note (Signed)
Summary: DIR COL, EVAL POSS HOSP PROCEDURE...AS.   History of Present Illness Visit Type: Initial Visit Primary GI MD: Stan Head MD [facility] Primary Provider: Janit Bern Chief Complaint: Consult for colonoscopy History of Present Illness:   50 yo ww here to discuss screening colonoscopy and using deep sedation. She was seen in previsit and suggested that she be considered for deep sedation to have sceening colonoscopy. Some recent nausea and diarrhea x 24 hours. Her son has had nausea and vomiting with some diarrhea for 3 days now.     GI Review of Systems      Denies abdominal pain, acid reflux, belching, bloating, chest pain, dysphagia with liquids, dysphagia with solids, heartburn, loss of appetite, nausea, vomiting, vomiting blood, weight loss, and  weight gain.        Denies anal fissure, change in bowel habit, constipation, diarrhea, diverticulosis, fecal incontinence, heme positive stool, hemorrhoids, irritable bowel syndrome, jaundice, light color stool, liver problems, rectal bleeding, and  rectal pain.    Current Medications (verified): 1)  Albuterol 90 Mcg/act  Aers (Albuterol) .... Inhale 2 Puffs Four Times A Day As Needed 2)  Lyrica 200 Mg  Caps (Pregabalin) .... 2 By Mouth Two Times A Day 3)  Effexor Xr 150 Mg Xr24h-Cap (Venlafaxine Hcl) .... 2 Tabs By Mouth Daily 4)  Ativan 0.5 Mg Tabs (Lorazepam) .Marland Kitchen.. 1 By Mouth Three Times A Day As Needed 5)  Truvada 200-300 Mg Tabs (Emtricitabine-Tenofovir) .... Take 1 Tablet By Mouth Once A Day 6)  Reyataz 300 Mg Caps (Atazanavir Sulfate) .... Take 1 Tablet By Mouth Once A Day 7)  Norvir 100 Mg Tabs (Ritonavir) .... Take 1 Tablet By Mouth Once A Day 8)  Aspir-Low 81 Mg Tbec (Aspirin) .Marland Kitchen.. 1 By Mouth Once Daily 9)  Atenolol 100 Mg Tabs (Atenolol) .... 1/2  By Mouth in The Am and 1/2 in The Pm 10)  Vicodin Es 7.5-750 Mg Tabs (Hydrocodone-Acetaminophen) .Marland Kitchen.. 1 By Mouth Every 6 Hours As Needed 11)  Depakote 250 Mg Tbec (Divalproex  Sodium) .... Take 3  Tablet By Mouth At Bedtime  Allergies (verified): No Known Drug Allergies  Past History:  Past Medical History: Asthma Depression/ bipolar Hypertension HIV infection on therapy  Past Surgical History: Reviewed history from 12/13/2006 and no changes required. Denies surgical history  Family History: M-  MVP, hyperlipidemia Family History of CAD Female 1st degree relative at 93-- father PGF--DM, CAD Family History High cholesterol Family History of Anxiety Family History Depression No FH of Colon Cancer:  Social History: unemployed Married- separated Current Smoker  1 PPD Alcohol use-no  Drug use-no  Regular exercise-yes Daily Caffeine Use  Dr Reino Kent  Review of Systems       The patient complains of shortness of breath.         All other ROS negative except as per HPI.   Vital Signs:  Patient profile:   50 year old female Menstrual status:  regular Height:      65.5 inches Weight:      155 pounds BMI:     25.49 BSA:     1.79 Pulse rate:   120 / minute Pulse rhythm:   irregular BP sitting:   130 / 90  (left arm)  Vitals Entered By: Merri Ray CMA Duncan Dull) (March 30, 2010 1:50 PM)  Physical Exam  General:  Well developed, well nourished, no acute distress. Lungs:  scattered wheezes with fair air movement Heart:  Regular rate and rhythm; no murmurs,  rubs,  or bruits. Abdomen:  soft and nontender BS+ no HSM/mass Psych:  appropriate affaect   Impression & Recommendations:  Problem # 1:  SPECIAL SCREENING FOR MALIGNANT NEOPLASMS COLON (ICD-V76.51) Assessment New Risks, benefits,and indications of endoscopic procedure(s) were reviewed with the patient and all questions answered.  Orders: Colonoscopy (Colon)  Problem # 2:  DIARRHEA OF PRESUMED INFECTIOUS ORIGIN (ICD-009.3) Assessment: New It sounds like she may be having onset of a gastrenteritis (likely contracted by exposure to son). supportive care for now as only minimal  signs and sxs.  Problem # 3:  BIPOLAR AFFECTIVE DISORDER (ICD-296.80) Assessment: New use propofol due to this disorder and medications that woud make it difficult to sedate her with moderate sedation  Patient Instructions: 1)  You have been scheduled for a colonoscopy with Propofol. Please follow written prep instructions that were given to you today at your visit.  2)  Please make sure to bring your inhaler with you for your colonoscopy as per Dr Marvell Fuller recommendations. 3)  Please pick up your prescriptions at the pharmacy. Electronic prescription(s) has already been sent for Moviprep. 4)  The medication list was reviewed and reconciled.  All changed / newly prescribed medications were explained.  A complete medication list was provided to the patient / caregiver. Prescriptions: MOVIPREP 100 GM  SOLR (PEG-KCL-NACL-NASULF-NA ASC-C) As per prep instructions.  #1 kit x 0   Entered by:   Lamona Curl CMA (AAMA)   Authorized by:   Iva Boop MD, [facility]   Signed by:   Lamona Curl CMA (AAMA) on 03/30/2010   Method used:   Faxed to ...       [facility] (retail)       [address]       [place], [place]  [address]       Ph: [phone]       Fax: [phone]   RxID:   [id number]

## 2010-05-05 NOTE — Progress Notes (Signed)
Summary: Lorazepam refill--   Phone Note Refill Request Message from:  Fax from Pharmacy on March 31, 2010 11:24 AM  Refills Requested: Medication #1:  ATIVAN 0.5 MG TABS 1 by mouth three times a day as needed [facility], [address], [place], [place]      phone [phone]    fax - [phone]    qty = 60  Next Appointment Scheduled: Mon 1/23   [place] Initial call taken by: Jerolyn Shin,  March 31, 2010 11:25 AM  Follow-up for Phone Call        last seen 03/22/10 and filled 12/15/09. please advise Follow-up by: Almeta Monas CMA Duncan Dull),  March 31, 2010 11:29 AM  Additional Follow-up for Phone Call Additional follow up Details #1::        ok for #60, no refills Additional Follow-up by: Neena Rhymes MD,  March 31, 2010 11:34 AM    Prescriptions: ATIVAN 0.5 MG TABS (LORAZEPAM) 1 by mouth three times a day as needed  #60 x 0   Entered by:   Almeta Monas CMA (AAMA)   Authorized by:   Neena Rhymes MD   Signed by:   Almeta Monas CMA (AAMA) on 03/31/2010   Method used:   Print then Give to Patient   RxID:   [id number]

## 2010-05-05 NOTE — Progress Notes (Signed)
Summary: Atenolol refill---see NOTE  Phone Note Refill Request Message from:  Fax from Pharmacy on April 22, 2010 10:38 AM  Refills Requested: Medication #1:  ATENOLOL 100 MG TABS 1/2  by mouth in the am and 1/2 in the pm   Notes: ****NOTE****   directions on fax state "Take one tablet by mouth every day" [facility], [address], [place], [place]      phone [phone]    fax - [phone]    qty = 30  Next Appointment Scheduled: Mon 1/23    [place] Initial call taken by: Jerolyn Shin,  April 22, 2010 10:39 AM    Prescriptions: ATENOLOL 100 MG TABS (ATENOLOL) 1/2  by mouth in the am and 1/2 in the pm  #30 x 3   Entered by:   Doristine Devoid CMA   Authorized by:   Loreen Freud DO   Signed by:   Doristine Devoid CMA on 04/22/2010   Method used:   Faxed to ...       [facility] (retail)       [address]       [place], [place]  [address]       Ph: [phone]       Fax: [phone]   RxID:   [id number]

## 2010-05-05 NOTE — Progress Notes (Signed)
Summary: Resch'd procedure  Phone Note Call from Patient Call back at Home Phone [phone]   Caller: Patient Call For: Dr. Leone Payor Reason for Call: Talk to Nurse Summary of Call: Needs to r/s her COL that is sch'd with PROPOFOL b/c she does not have a driver Initial call taken by: Karna Christmas,  April 01, 2010 10:56 AM  Follow-up for Phone Call        Pt's. procedure r/s to 05/03/2010. Given new instructions. Follow-up by: Teryl Lucy RN,  April 01, 2010 1:34 PM  Additional Follow-up for Phone Call Additional follow up Details #1::        ok Additional Follow-up by: Iva Boop MD, [facility] Graham,  April 01, 2010 3:49 PM

## 2010-05-05 NOTE — Progress Notes (Signed)
Summary: Hydrocodone - apap    refill---Dr.lowne Pt  Phone Note Refill Request Message from:  Fax from Pharmacy on March 30, 2010 4:33 PM  Refills Requested: Medication #1:  VICODIN ES 7.5-750 MG TABS 1 by mouth every 6 hours as needed [facility], [address], [place], [place]      phone [phone]    fax - [phone]    qty = 30  Next Appointment Scheduled: Mon 04/25/2010   [place] Initial call taken by: Jerolyn Shin,  March 30, 2010 4:36 PM  Follow-up for Phone Call        last seen 03/22/10 anf filled 03/15/10 with a quantity of 30.Marland KitchenMarland KitchenMarland Kitchenplease advise Follow-up by: Almeta Monas CMA Duncan Dull),  March 31, 2010 9:59 AM  Additional Follow-up for Phone Call Additional follow up Details #1::        ok for #30, no refill Additional Follow-up by: Neena Rhymes MD,  March 31, 2010 10:06 AM    Prescriptions: VICODIN ES 7.5-750 MG TABS (HYDROCODONE-ACETAMINOPHEN) 1 by mouth every 6 hours as needed  #30 x 0   Entered by:   Almeta Monas CMA (AAMA)   Authorized by:   Loreen Freud DO   Signed by:   Almeta Monas CMA (AAMA) on 03/31/2010   Method used:   Printed then faxed to ...       [facility] (retail)       [address]       [place], [place]  [address]       Ph: [phone]       Fax: [phone]   RxID:   [id number]

## 2010-05-05 NOTE — Progress Notes (Signed)
Summary: u/S orders  Phone Note Call from Patient Call back at Home Phone [phone]   Caller: Patient Summary of Call: Pt states that she just started her period and should be finish by 04-13-10. Pt aware U/S order put in for 04-20-10 which is the following week.......Marland KitchenFelecia Deloach CMA  April 08, 2010 2:50 PM

## 2010-05-09 ENCOUNTER — Ambulatory Visit: Payer: Self-pay | Admitting: Family Medicine

## 2010-05-11 NOTE — Progress Notes (Signed)
Summary: refill  Phone Note Refill Request Message from:  Fax from Pharmacy on May 05, 2010 4:22 PM  Refills Requested: Medication #1:  ATIVAN 0.5 MG TABS 1 by mouth three times a day as needed [facility] - fax [phone] - phone [phone]  Initial call taken by: Okey Regal Spring,  May 05, 2010 4:23 PM  Follow-up for Phone Call        last seen 04/25/10 and filled 03/31/10 please advise Follow-up by: Almeta Monas CMA Duncan Dull),  May 05, 2010 5:08 PM  Additional Follow-up for Phone Call Additional follow up Details #1::        refill x1 Additional Follow-up by: Loreen Freud DO,  May 05, 2010 8:12 PM    Prescriptions: ATIVAN 0.5 MG TABS (LORAZEPAM) 1 by mouth three times a day as needed  #60 x 0   Entered by:   Almeta Monas CMA (AAMA)   Authorized by:   Loreen Freud DO   Signed by:   Almeta Monas CMA (AAMA) on 05/06/2010   Method used:   Printed then faxed to ...       [facility] (retail)       [address]       [place], [place]  [address]       Ph: [phone]       Fax: [phone]   RxID:   [id number]

## 2010-05-11 NOTE — Procedures (Signed)
Summary: Colonoscopy  Patient: Latoya Wilson Note: All result statuses are Final unless otherwise noted.  Tests: (1) Colonoscopy (COL)   COL Colonoscopy           DONE     [facility]     [address]     [place], [place]  [address]           COLONOSCOPY PROCEDURE REPORT           PATIENT:  Latoya Wilson, Latoya Wilson  MR#:  [id number]     BIRTHDATE:  Mar 03, 1961, 49 yrs. old  GENDER:  female     ENDOSCOPIST:  Iva Boop, MD, [facility]     REF. BY:  Loreen Freud, DO     PROCEDURE DATE:  05/03/2010     PROCEDURE:  Colonoscopy [address]     ASA CLASS:  Class II     INDICATIONS:  Routine Risk Screening     MEDICATIONS:   MAC sedation, administered by CRNA, propofol     (Diprivan) 200 mg IV           DESCRIPTION OF PROCEDURE:   After the risks benefits and     alternatives of the procedure were thoroughly explained, informed     consent was obtained.  Digital rectal exam was performed and     revealed no abnormalities.   The LB [id number] endoscope     was introduced through the anus and advanced to the cecum, which     was identified by both the appendix and ileocecal valve, without     limitations.  The quality of the prep was excellent, using     MoviPrep.  The instrument was then slowly withdrawn as the colon     was fully examined. Insertion: 3:15 minutes Withdrawal: 14:05     minutes     <<PROCEDUREIMAGES>>           FINDINGS:  A normal appearing cecum, ileocecal valve, and     appendiceal orifice were identified. The ascending, hepatic     flexure, transverse, splenic flexure, descending, sigmoid colon,     and rectum appeared unremarkable.   Retroflexed views in the right     colon and rectum revealed no abnormalities.    The scope was then     withdrawn from the patient and the procedure completed.           COMPLICATIONS:  None     ENDOSCOPIC IMPRESSION:     1) Normal colonoscopy with excellent prep           REPEAT EXAM:  In 10 year(s) for routine screening  colonoscopy.           Iva Boop, MD, [facility]           CC:  Lelon Perla, DO     The Patient           n.     eSIGNED:   Iva Boop at 05/03/2010 03:21 PM           Grant Fontana, [phone]  Note: An exclamation mark (!) indicates a result that was not dispersed into the flowsheet. Document Creation Date: 05/03/2010 3:21 PM _______________________________________________________________________  (1) Order result status: Final Collection or observation date-time: 05/03/2010 15:15 Requested date-time:  Receipt date-time:  Reported date-time:  Referring Physician:   Ordering Physician: Stan Head [phone]) Specimen Source:  Source: Launa Grill Order Number: [id number] Lab site:   Appended Document: Colonoscopy  Clinical Lists Changes  Observations: Added new observation of COLONNXTDUE: 04/2020 (05/03/2010 15:27)

## 2010-05-11 NOTE — Progress Notes (Signed)
Summary: Refill Request ---1/27, 1/31,2/1  Phone Note Refill Request Call back at [phone] Message from:  Pharmacy on April 29, 2010 12:57 PM  Refills Requested: Medication #1:  VICODIN ES 7.5-750 MG TABS 1 by mouth every 6 hours as needed   Dosage confirmed as above?Dosage Confirmed   Brand Name Necessary? No   Supply Requested: 30   Last Refilled: 03/15/2010 [facility]  Next Appointment Scheduled: 2.6.12 Initial call taken by: Harold Barban,  April 29, 2010 12:57 PM  Follow-up for Phone Call        last seen 04/25/10 and filled 03/15/10.Marland KitchenMarland Kitchenplease advise Follow-up by: Almeta Monas CMA Duncan Dull),  April 29, 2010 1:36 PM  Additional Follow-up for Phone Call Additional follow up Details #1::        ok to refill x1 Additional Follow-up by: Loreen Freud DO,  April 29, 2010 1:45 PM    Additional Follow-up for Phone Call Additional follow up Details #2::    rcv'd mssg fro patient wanting to know if we were going to send her to Neurosurgeon for Bulging disc.Marland KitchenMarland KitchenPer Dr.Lowne we were trying to take care of the mass in the pelvis first..... Left message to call back... Almeta Monas CMA Duncan Dull)  April 29, 2010 2:36 PM   Left message to call back... Almeta Monas CMA Duncan Dull)  May 03, 2010 1:23 PM Left message to call back.... Almeta Monas CMA Duncan Dull)  May 04, 2010 8:43 AM   Additional Follow-up for Phone Call Additional follow up Details #3:: Details for Additional Follow-up Action Taken: pt aware of the above... Almeta Monas CMA (AAMA)  May 04, 2010 1:04 PM   Prescriptions: VICODIN ES 7.5-750 MG TABS (HYDROCODONE-ACETAMINOPHEN) 1 by mouth every 6 hours as needed  #30 x 0   Entered by:   Almeta Monas CMA (AAMA)   Authorized by:   Loreen Freud DO   Signed by:   Almeta Monas CMA (AAMA) on 04/29/2010   Method used:   Print then Give to Patient   RxID:   [id number]

## 2010-05-13 ENCOUNTER — Other Ambulatory Visit: Payer: Self-pay | Admitting: Family Medicine

## 2010-05-13 ENCOUNTER — Encounter: Payer: Self-pay | Admitting: Family Medicine

## 2010-05-13 ENCOUNTER — Ambulatory Visit (INDEPENDENT_AMBULATORY_CARE_PROVIDER_SITE_OTHER): Payer: Medicaid Other | Admitting: Family Medicine

## 2010-05-13 DIAGNOSIS — IMO0002 Reserved for concepts with insufficient information to code with codable children: Secondary | ICD-10-CM

## 2010-05-13 DIAGNOSIS — L259 Unspecified contact dermatitis, unspecified cause: Secondary | ICD-10-CM

## 2010-05-13 DIAGNOSIS — I10 Essential (primary) hypertension: Secondary | ICD-10-CM

## 2010-05-13 DIAGNOSIS — R1909 Other intra-abdominal and pelvic swelling, mass and lump: Secondary | ICD-10-CM

## 2010-05-13 DIAGNOSIS — E785 Hyperlipidemia, unspecified: Secondary | ICD-10-CM

## 2010-05-13 DIAGNOSIS — Z79899 Other long term (current) drug therapy: Secondary | ICD-10-CM

## 2010-05-13 DIAGNOSIS — D518 Other vitamin B12 deficiency anemias: Secondary | ICD-10-CM

## 2010-05-13 LAB — CBC WITH DIFFERENTIAL/PLATELET
Basophils Absolute: 0 10*3/uL (ref 0.0–0.1)
Basophils Relative: 0.2 % (ref 0.0–3.0)
Eosinophils Absolute: 0 10*3/uL (ref 0.0–0.7)
MCHC: 34.7 g/dL (ref 30.0–36.0)
MCV: 93.3 fl (ref 78.0–100.0)
Monocytes Absolute: 0.3 10*3/uL (ref 0.1–1.0)
Neutro Abs: 7.9 10*3/uL — ABNORMAL HIGH (ref 1.4–7.7)
Neutrophils Relative %: 84.3 % — ABNORMAL HIGH (ref 43.0–77.0)
RBC: 4.94 Mil/uL (ref 3.87–5.11)
RDW: 14 % (ref 11.5–14.6)

## 2010-05-13 LAB — BASIC METABOLIC PANEL
CO2: 27 mEq/L (ref 19–32)
Calcium: 9.5 mg/dL (ref 8.4–10.5)
Chloride: 102 mEq/L (ref 96–112)
Creatinine, Ser: 0.9 mg/dL (ref 0.4–1.2)
Glucose, Bld: 118 mg/dL — ABNORMAL HIGH (ref 70–99)

## 2010-05-13 LAB — HEPATIC FUNCTION PANEL
Albumin: 4 g/dL (ref 3.5–5.2)
Bilirubin, Direct: 0.2 mg/dL (ref 0.0–0.3)
Total Protein: 8 g/dL (ref 6.0–8.3)

## 2010-05-13 LAB — LIPID PANEL
Cholesterol: 256 mg/dL — ABNORMAL HIGH (ref 0–200)
Total CHOL/HDL Ratio: 5
Triglycerides: 160 mg/dL — ABNORMAL HIGH (ref 0.0–149.0)

## 2010-05-13 LAB — B12 AND FOLATE PANEL
Folate: 13.7 ng/mL (ref >5.9)
Vitamin B-12: 391 pg/mL (ref 211–911)

## 2010-05-16 LAB — LDL CHOLESTEROL, DIRECT: Direct LDL: 189.7 mg/dL

## 2010-05-18 ENCOUNTER — Telehealth (INDEPENDENT_AMBULATORY_CARE_PROVIDER_SITE_OTHER): Payer: Self-pay | Admitting: *Deleted

## 2010-05-19 ENCOUNTER — Ambulatory Visit (INDEPENDENT_AMBULATORY_CARE_PROVIDER_SITE_OTHER): Payer: Medicaid Other | Admitting: Advanced Practice Midwife

## 2010-05-19 ENCOUNTER — Other Ambulatory Visit: Payer: Self-pay | Admitting: Family Medicine

## 2010-05-19 ENCOUNTER — Encounter (INDEPENDENT_AMBULATORY_CARE_PROVIDER_SITE_OTHER): Payer: Self-pay | Admitting: *Deleted

## 2010-05-19 ENCOUNTER — Other Ambulatory Visit: Payer: Self-pay | Admitting: Obstetrics and Gynecology

## 2010-05-19 DIAGNOSIS — N83209 Unspecified ovarian cyst, unspecified side: Secondary | ICD-10-CM

## 2010-05-19 DIAGNOSIS — Z1231 Encounter for screening mammogram for malignant neoplasm of breast: Secondary | ICD-10-CM

## 2010-05-25 ENCOUNTER — Telehealth (INDEPENDENT_AMBULATORY_CARE_PROVIDER_SITE_OTHER): Payer: Self-pay | Admitting: *Deleted

## 2010-05-25 NOTE — Miscellaneous (Signed)
  Clinical Lists Changes  Observations: Added new observation of PCTFPL: 122.80  (05/19/2010 16:32) Added new observation of INCOMESOURCE: WAGES  (05/19/2010 16:32) Added new observation of HOUSEINCOME: 65784  (05/19/2010 16:32)

## 2010-05-25 NOTE — Progress Notes (Signed)
Summary: APPROVED Atorvastatin  prior auth  Phone Note Refill Request Message from:  Fax from Pharmacy on May 18, 2010 3:21 PM  Refills Requested: Medication #1:  LIPITOR 10 MG TABS 1 by mouth at bedtime. [facility], [address], [place], [place]      phone [phone]    fax - [phone]    qty = 30     needs prior auth---[phone]  Next Appointment Scheduled: Tues  5/15   lab Initial call taken by: Jerolyn Shin,  May 18, 2010 3:24 PM  Follow-up for Phone Call        Approved 05-18-09 until 05-18-11. pharmacy faxed......Marland KitchenFelecia Deloach CMA  May 18, 2010 3:49 PM

## 2010-05-25 NOTE — Assessment & Plan Note (Signed)
Summary: 2 week followup/kn   Vital Signs:  Patient profile:   50 year old female Menstrual status:  regular Weight:      158.13 pounds Pulse rate:   78 / minute Pulse rhythm:   regular BP sitting:   138 / 84  (left arm) Cuff size:   regular  Vitals Entered By: Army Fossa CMA (May 13, 2010 1:47 PM) CC: 2 week f/u- discuss last [place] and rash on face and ears since increasing atenlol. Comments [facility]   History of Present Illness: Pt here c/o rash on face only.  Pt has been using a new cream on face.  No other new complaints.    Problems Prior to Update: 1)  Bipolar Affective Disorder  (ICD-296.80) 2)  Laceration of Finger  (ICD-883.0) 3)  Adnexal Mass, Left  (ICD-789.39) 4)  Acute Sinusitis, Unspecified  (ICD-461.9) 5)  Back Pain, Lumbar, With Radiculopathy  (ICD-724.4) 6)  Hypertension  (ICD-401.9) 7)  Sinusitis, Chronic  (ICD-473.9) 8)  Encounter For Long-term Use of Other Medications  (ICD-V58.69) 9)  Plantar Wart, Right  (ICD-078.12) 10)  Anxiety State, Unspecified  (ICD-300.00) 11)  Memory Loss  (ICD-780.93) 12)  Reflex Sympathetic Dystrophy  (ICD-337.20) 13)  Restless Legs Syndrome  (ICD-333.94) 14)  Bronchitis  (ICD-490) 15)  Nevus  (ICD-216.9) 16)  Abscess, Tooth  (ICD-522.5) 17)  Increased Blood Pressure  (ICD-796.2) 18)  Lesion, Vagina  (ICD-236.3) 19)  Tendinitis  (ICD-726.90) 20)  HIV Infection  (ICD-042) 21)  Abfnd, Serologic Evidence of HIV  (ICD-795.71) 22)  Sexual Activity, High Risk  (ICD-V69.2) 23)  Hx, Personal, Tobacco Use  (ICD-V15.82) 24)  Anemia, Vitamin B12 Deficiency  (ICD-281.1) 25)  Family History Depression  (ICD-V17.0) 26)  Family History of Cad Female 1st Degree Relative <50  (ICD-V17.3) 27)  Muscle Pain  (ICD-729.1) 28)  Periodic Limb Movement Disorder  (ICD-333.99) 29)  Depression  (ICD-311) 30)  Asthma  (ICD-493.90)  Medications Prior to Update: 1)  Albuterol 90 Mcg/act  Aers (Albuterol) .... Inhale 2 Puffs  Four Times A Day As Needed 2)  Lyrica 200 Mg  Caps (Pregabalin) .... 2 By Mouth Two Times A Day 3)  Effexor Xr 150 Mg Xr24h-Cap (Venlafaxine Hcl) .... 2 Tabs By Mouth Daily 4)  Ativan 0.5 Mg Tabs (Lorazepam) .Marland Kitchen.. 1 By Mouth Three Times A Day As Needed 5)  Truvada 200-300 Mg Tabs (Emtricitabine-Tenofovir) .... Take 1 Tablet By Mouth Once A Day 6)  Reyataz 300 Mg Caps (Atazanavir Sulfate) .... Take 1 Tablet By Mouth Once A Day 7)  Norvir 100 Mg Tabs (Ritonavir) .... Take 1 Tablet By Mouth Once A Day 8)  Aspir-Low 81 Mg Tbec (Aspirin) .Marland Kitchen.. 1 By Mouth Once Daily 9)  Atenolol 100 Mg Tabs (Atenolol) .Marland Kitchen.. 1 By Mouth Two Times A Day 10)  Vicodin Es 7.5-750 Mg Tabs (Hydrocodone-Acetaminophen) .Marland Kitchen.. 1 By Mouth Every 6 Hours As Needed 11)  Depakote 250 Mg Tbec (Divalproex Sodium) .... Take 3  Tablet By Mouth At Bedtime  Current Medications (verified): 1)  Albuterol 90 Mcg/act  Aers (Albuterol) .... Inhale 2 Puffs Four Times A Day As Needed 2)  Lyrica 200 Mg  Caps (Pregabalin) .... 2 By Mouth Two Times A Day 3)  Effexor Xr 150 Mg Xr24h-Cap (Venlafaxine Hcl) .... 2 Tabs By Mouth Daily 4)  Ativan 0.5 Mg Tabs (Lorazepam) .Marland Kitchen.. 1 By Mouth Three Times A Day As Needed 5)  Truvada 200-300 Mg Tabs (Emtricitabine-Tenofovir) .... Take 1 Tablet By Mouth Once  A Day 6)  Reyataz 300 Mg Caps (Atazanavir Sulfate) .... Take 1 Tablet By Mouth Once A Day 7)  Norvir 100 Mg Tabs (Ritonavir) .... Take 1 Tablet By Mouth Once A Day 8)  Aspir-Low 81 Mg Tbec (Aspirin) .Marland Kitchen.. 1 By Mouth Once Daily 9)  Atenolol 100 Mg Tabs (Atenolol) .Marland Kitchen.. 1 By Mouth Two Times A Day 10)  Vicodin Es 7.5-750 Mg Tabs (Hydrocodone-Acetaminophen) .Marland Kitchen.. 1 By Mouth Every 6 Hours As Needed 11)  Depakote 250 Mg Tbec (Divalproex Sodium) .... Take 3  Tablet By Mouth At Bedtime  Allergies (verified): No Known Drug Allergies  Past History:  Past Medical History: Last updated: 03/30/2010 Asthma Depression/ bipolar Hypertension HIV infection on  therapy  Past Surgical History: Last updated: 12/13/2006 Denies surgical history  Family History: Last updated: 03/30/2010 M-  MVP, hyperlipidemia Family History of CAD Female 1st degree relative at 47-- father PGF--DM, CAD Family History High cholesterol Family History of Anxiety Family History Depression No FH of Colon Cancer:  Social History: Last updated: 03/30/2010 unemployed Married- separated Current Smoker  1 PPD Alcohol use-no  Drug use-no  Regular exercise-yes Daily Caffeine Use  Dr Reino Kent  Risk Factors: Alcohol Use: 0 (03/04/2010) Caffeine Use: soda (03/04/2010) Exercise: yes (03/04/2010)  Risk Factors: Smoking Status: current (03/04/2010) Packs/Day: 1.0 (03/04/2010) Passive Smoke Exposure: no (03/04/2010)  Family History: Reviewed history from 03/30/2010 and no changes required. M-  MVP, hyperlipidemia Family History of CAD Female 1st degree relative at 23-- father PGF--DM, CAD Family History High cholesterol Family History of Anxiety Family History Depression No FH of Colon Cancer:  Social History: Reviewed history from 03/30/2010 and no changes required. unemployed Married- separated Current Smoker  1 PPD Alcohol use-no  Drug use-no  Regular exercise-yes Daily Caffeine Use  Dr Reino Kent  Review of Systems      See HPI  Physical Exam  General:  Well-developed,well-nourished,in no acute distress; alert,appropriate and cooperative throughout examination Lungs:  Normal respiratory effort, chest expands symmetrically. Lungs are clear to auscultation, no crackles or wheezes. Heart:  normal rate and no murmur.   Skin:  papular rash forehead and chin Psych:  Oriented X3 and normally interactive.     Impression & Recommendations:  Problem # 1:  ADNEXAL MASS, LEFT (ICD-789.39) f/u gyn  Problem # 2:  CONTACT DERMATITIS (ICD-692.9)  Discussed avoidance of triggers and symptomatic treatment.   Problem # 3:  BACK PAIN, LUMBAR, WITH RADICULOPATHY  (ICD-724.4)  Her updated medication list for this problem includes:    Aspir-low 81 Mg Tbec (Aspirin) .Marland Kitchen... 1 by mouth once daily    Vicodin Es 7.5-750 Mg Tabs (Hydrocodone-acetaminophen) .Marland Kitchen... 1 by mouth every 6 hours as needed  Orders: Venipuncture (40102) TLB-Lipid Panel (80061-LIPID) TLB-BMP (Basic Metabolic Panel-BMET) (80048-METABOL) TLB-CBC Platelet - w/Differential (85025-CBCD) TLB-Hepatic/Liver Function Pnl (80076-HEPATIC) TLB-TSH (Thyroid Stimulating Hormone) (84443-TSH) TLB-B12 + Folate Pnl (72536_64403-K74/QVZ) Specimen Handling (56387) Orthopedic Surgeon Referral (Ortho Surgeon)  Complete Medication List: 1)  Albuterol 90 Mcg/act Aers (Albuterol) .... Inhale 2 puffs four times a day as needed 2)  Lyrica 200 Mg Caps (Pregabalin) .... 2 by mouth two times a day 3)  Effexor Xr 150 Mg Xr24h-cap (Venlafaxine hcl) .... 2 tabs by mouth daily 4)  Ativan 0.5 Mg Tabs (Lorazepam) .Marland Kitchen.. 1 by mouth three times a day as needed 5)  Truvada 200-300 Mg Tabs (Emtricitabine-tenofovir) .... Take 1 tablet by mouth once a day 6)  Reyataz 300 Mg Caps (Atazanavir sulfate) .... Take 1 tablet by mouth once a day  7)  Norvir 100 Mg Tabs (Ritonavir) .... Take 1 tablet by mouth once a day 8)  Aspir-low 81 Mg Tbec (Aspirin) .Marland Kitchen.. 1 by mouth once daily 9)  Atenolol 100 Mg Tabs (Atenolol) .Marland Kitchen.. 1 by mouth two times a day 10)  Vicodin Es 7.5-750 Mg Tabs (Hydrocodone-acetaminophen) .Marland Kitchen.. 1 by mouth every 6 hours as needed 11)  Depakote 250 Mg Tbec (Divalproex sodium) .... Take 3  tablet by mouth at bedtime   Orders Added: 1)  Venipuncture [36415] 2)  TLB-Lipid Panel [80061-LIPID] 3)  TLB-BMP (Basic Metabolic Panel-BMET) [80048-METABOL] 4)  TLB-CBC Platelet - w/Differential [85025-CBCD] 5)  TLB-Hepatic/Liver Function Pnl [80076-HEPATIC] 6)  TLB-TSH (Thyroid Stimulating Hormone) [84443-TSH] 7)  TLB-B12 + Folate Pnl [82746_82607-B12/FOL] 8)  Specimen Handling [99000] 9)  Orthopedic Surgeon Referral [Ortho  Surgeon] 10)  Est. Patient Level III [81191]

## 2010-05-26 ENCOUNTER — Ambulatory Visit (HOSPITAL_COMMUNITY)
Admission: RE | Admit: 2010-05-26 | Discharge: 2010-05-26 | Disposition: A | Discharge status: Home / Self Care | Payer: Medicaid Other | Source: Ambulatory Visit | Attending: Family Medicine | Admitting: Family Medicine

## 2010-05-26 DIAGNOSIS — N83209 Unspecified ovarian cyst, unspecified side: Secondary | ICD-10-CM

## 2010-05-26 DIAGNOSIS — Z1231 Encounter for screening mammogram for malignant neoplasm of breast: Secondary | ICD-10-CM | POA: Insufficient documentation

## 2010-05-31 NOTE — Progress Notes (Signed)
Summary: Refill--Hydrocodone  Phone Note Refill Request Message from:  Fax from Pharmacy on May 25, 2010 12:34 PM  Refills Requested: Medication #1:  VICODIN ES 7.5-750 MG TABS 1 by mouth every 6 hours as needed   Last Refilled: 05/04/2010 [facility]  Next Appointment Scheduled: none Initial call taken by: Lucious Groves CMA,  May 25, 2010 12:34 PM  Follow-up for Phone Call        last seen 05/13/10 for a rash and last filled 05/04/10 please advise Follow-up by: Almeta Monas CMA Duncan Dull),  May 25, 2010 1:48 PM     Appended Document: Refill--Hydrocodone    Phone Note Call from Patient   Caller: Patient Summary of Call: last seen 05/13/10 for a rash and filled 05/04/10.Marland KitchenMarland Kitchenplease advise Initial call taken by: Almeta Monas CMA Duncan Dull),  May 25, 2010 1:50 PM  Follow-up for Phone Call        too soon Follow-up by: Loreen Freud DO,  May 25, 2010 3:18 PM  Additional Follow-up for Phone Call Additional follow up Details #1::        mssg left for pharmacy advising RX sent too soon Additional Follow-up by: Almeta Monas CMA Duncan Dull),  May 25, 2010 4:57 PM

## 2010-06-01 ENCOUNTER — Other Ambulatory Visit: Payer: Self-pay | Admitting: Obstetrics and Gynecology

## 2010-06-01 DIAGNOSIS — R928 Other abnormal and inconclusive findings on diagnostic imaging of breast: Secondary | ICD-10-CM

## 2010-06-02 ENCOUNTER — Encounter: Payer: Self-pay | Admitting: Family Medicine

## 2010-06-03 ENCOUNTER — Telehealth: Payer: Self-pay | Admitting: Family Medicine

## 2010-06-07 ENCOUNTER — Encounter: Payer: Self-pay | Admitting: Family Medicine

## 2010-06-08 ENCOUNTER — Ambulatory Visit (INDEPENDENT_AMBULATORY_CARE_PROVIDER_SITE_OTHER): Payer: Medicaid Other | Admitting: Obstetrics and Gynecology

## 2010-06-08 DIAGNOSIS — R87619 Unspecified abnormal cytological findings in specimens from cervix uteri: Secondary | ICD-10-CM

## 2010-06-14 LAB — T-HELPER CELL (CD4) - (RCID CLINIC ONLY)
CD4 % Helper T Cell: 26 % — ABNORMAL LOW (ref 33–55)
CD4 T Cell Abs: 210 uL — ABNORMAL LOW (ref 400–2700)

## 2010-06-14 NOTE — Progress Notes (Signed)
Summary: refill  Phone Note Refill Request Message from:  Fax from Pharmacy on June 03, 2010 4:46 PM  Refills Requested: Medication #1:  VICODIN ES 7.5-750 MG TABS 1 by mouth every 6 hours as needed   Last Refilled: 05/06/2010  Medication #2:  ATIVAN 0.5 MG TABS 1 by mouth three times a day as needed   Last Refilled: 05/04/2010 [facility] - fax [phone]  Initial call taken by: Okey Regal Spring,  June 03, 2010 4:48 PM  Follow-up for Phone Call        last filled 2/1 and 2/3 and last seen 05/13/10 please advise Follow-up by: Almeta Monas CMA Duncan Dull),  June 06, 2010 10:59 AM  Additional Follow-up for Phone Call Additional follow up Details #1::        ok to refill each x1 Additional Follow-up by: Loreen Freud DO,  June 06, 2010 11:29 AM    Prescriptions: VICODIN ES 7.5-750 MG TABS (HYDROCODONE-ACETAMINOPHEN) 1 by mouth every 6 hours as needed  #30 x 0   Entered by:   Almeta Monas CMA (AAMA)   Authorized by:   Loreen Freud DO   Signed by:   Almeta Monas CMA (AAMA) on 06/06/2010   Method used:   Printed then faxed to ...       [facility]* (retail)       [address]       [place], [place]  [address]       Ph: [phone]       Fax: [phone]   RxID:   [id number] ATIVAN 0.5 MG TABS (LORAZEPAM) 1 by mouth three times a day as needed  #60 x 0   Entered by:   Almeta Monas CMA (AAMA)   Authorized by:   Loreen Freud DO   Signed by:   Almeta Monas CMA (AAMA) on 06/06/2010   Method used:   Printed then faxed to ...       [facility]* (retail)       [address]       [place], [place]  [address]       Ph: [phone]       Fax: [phone]   RxID:   [id number]

## 2010-06-15 ENCOUNTER — Other Ambulatory Visit: Payer: Medicaid Other

## 2010-06-16 ENCOUNTER — Ambulatory Visit: Payer: Medicaid Other | Attending: Physical Medicine and Rehabilitation | Admitting: Physical Therapy

## 2010-06-16 DIAGNOSIS — M2550 Pain in unspecified joint: Secondary | ICD-10-CM | POA: Insufficient documentation

## 2010-06-16 DIAGNOSIS — M2560 Stiffness of unspecified joint, not elsewhere classified: Secondary | ICD-10-CM | POA: Insufficient documentation

## 2010-06-16 DIAGNOSIS — M6281 Muscle weakness (generalized): Secondary | ICD-10-CM | POA: Insufficient documentation

## 2010-06-16 DIAGNOSIS — IMO0001 Reserved for inherently not codable concepts without codable children: Secondary | ICD-10-CM | POA: Insufficient documentation

## 2010-06-16 LAB — DIFFERENTIAL
Basophils Absolute: 0 10*3/uL (ref 0.0–0.1)
Lymphocytes Relative: 26 % (ref 12–46)
Lymphs Abs: 1.4 10*3/uL (ref 0.7–4.0)
Monocytes Absolute: 0.5 10*3/uL (ref 0.1–1.0)
Monocytes Relative: 9 % (ref 3–12)
Neutro Abs: 3.3 10*3/uL (ref 1.7–7.7)

## 2010-06-16 LAB — BASIC METABOLIC PANEL
GFR calc Af Amer: >60 mL/min (ref >60)
GFR calc non Af Amer: >60 mL/min (ref >60)
Potassium: 3.3 mEq/L — ABNORMAL LOW (ref 3.5–5.1)
Sodium: 138 mEq/L (ref 135–145)

## 2010-06-16 LAB — CBC
HCT: 38.7 % (ref 36.0–46.0)
Hemoglobin: 13.4 g/dL (ref 12.0–15.0)
RBC: 4.39 MIL/uL (ref 3.87–5.11)
RDW: 12.3 % (ref 11.5–15.5)
WBC: 5.4 10*3/uL (ref 4.0–10.5)

## 2010-06-20 ENCOUNTER — Telehealth (INDEPENDENT_AMBULATORY_CARE_PROVIDER_SITE_OTHER): Payer: Self-pay | Admitting: *Deleted

## 2010-06-21 ENCOUNTER — Ambulatory Visit
Admission: RE | Admit: 2010-06-21 | Discharge: 2010-06-21 | Disposition: A | Discharge status: Home / Self Care | Payer: Medicaid Other | Source: Ambulatory Visit | Attending: Obstetrics and Gynecology | Admitting: Obstetrics and Gynecology

## 2010-06-21 DIAGNOSIS — R928 Other abnormal and inconclusive findings on diagnostic imaging of breast: Secondary | ICD-10-CM

## 2010-06-21 NOTE — Letter (Signed)
Summary: [facility]   Imported By: Maryln Gottron 06/10/2010 14:49:59  _____________________________________________________________________  External Attachment:    Type:   Image     Comment:   External Document

## 2010-06-21 NOTE — Letter (Signed)
Summary: [facility]   Imported By: Maryln Gottron 06/10/2010 11:13:03  _____________________________________________________________________  External Attachment:    Type:   Image     Comment:   External Document

## 2010-06-22 LAB — T-HELPER CELL (CD4) - (RCID CLINIC ONLY)
CD4 % Helper T Cell: 47 % (ref 33–55)
CD4 T Cell Abs: 410 uL (ref 400–2700)

## 2010-06-23 ENCOUNTER — Ambulatory Visit: Payer: Medicaid Other | Admitting: Rehabilitation

## 2010-06-24 NOTE — Progress Notes (Unsigned)
NAMEGIULIANA, Latoya Wilson                ACCOUNT NO.:  [id number]  MEDICAL RECORD NO.:  [id number]           PATIENT TYPE:  A  LOCATION:  [facility]                   FACILITY:  [facility]  PHYSICIAN:  Argentina Donovan, MD        DATE OF BIRTH:  01-12-1961  DATE OF SERVICE:  05/19/2010                                 CLINIC NOTE  The patient is a 50 year old Caucasian female with HIV, hypertension, back pain who was previously seen for condylomata acuminata, was sent over by primary doctor after they did an MRI and then an ultrasound showing complex ovarian cyst, tiny little cyst probably compatible with a corpus luteum.  It has been more than 6 weeks, I am going to repeat the ultrasound and have her come back.  We will also get a mammogram. She has not had one of those in 2 years.  We did a Pap smear today.  The genital findings were all normal and there are no sign of any condylomata acuminata.  She does have a bulging lumbar disk which I guess she is to get treatment for, other than that she is fine.  She has had little bit of dysfunctional uterine bleeding, and lately will have endometrium evaluated and see whether or not, we think she deserves an endometrial biopsy, at this point I do not think so.          ______________________________ Argentina Donovan, MD    PR/MEDQ  D:  05/19/2010  T:  05/20/2010  Job:  [id number]

## 2010-06-27 LAB — URINALYSIS, ROUTINE W REFLEX MICROSCOPIC
Nitrite: NEGATIVE
Specific Gravity, Urine: 1.017 (ref 1.005–1.030)
pH: 6.5 (ref 5.0–8.0)

## 2010-06-27 LAB — DIFFERENTIAL
Eosinophils Relative: 2 % (ref 0–5)
Lymphocytes Relative: 10 % — ABNORMAL LOW (ref 12–46)
Lymphs Abs: 0.8 10*3/uL (ref 0.7–4.0)
Neutrophils Relative %: 82 % — ABNORMAL HIGH (ref 43–77)

## 2010-06-27 LAB — CBC
HCT: 37.7 % (ref 36.0–46.0)
Platelets: 178 10*3/uL (ref 150–400)
WBC: 8 10*3/uL (ref 4.0–10.5)

## 2010-06-27 LAB — PREGNANCY, URINE: Preg Test, Ur: NEGATIVE

## 2010-06-27 LAB — BASIC METABOLIC PANEL
BUN: 11 mg/dL (ref 6–23)
GFR calc non Af Amer: >60 mL/min (ref >60)
Potassium: 3.1 mEq/L — ABNORMAL LOW (ref 3.5–5.1)

## 2010-06-28 ENCOUNTER — Other Ambulatory Visit: Payer: Self-pay | Admitting: Family Medicine

## 2010-06-28 NOTE — Telephone Encounter (Signed)
Only 1 refill

## 2010-06-28 NOTE — Telephone Encounter (Signed)
Last OV 05-13-10, med never filled by you.Felecia Matthe Sloane CMA

## 2010-06-29 ENCOUNTER — Encounter: Payer: Medicaid Other | Admitting: Family Medicine

## 2010-06-29 MED ORDER — ALBUTEROL 90 MCG/ACT IN AERS: 2.0000 | INHALATION_SPRAY | Freq: Four times a day (QID) | RESPIRATORY_TRACT | Status: DC | PRN

## 2010-06-30 ENCOUNTER — Ambulatory Visit: Payer: Medicaid Other | Admitting: Physical Therapy

## 2010-06-30 NOTE — Progress Notes (Signed)
Summary: Atenolol refill  Phone Note Refill Request Message from:  Fax from Pharmacy on June 20, 2010 1:39 PM  Refills Requested: Medication #1:  ATENOLOL 100 MG TABS 1 by mouth two times a day [facility], [address], [place], [place]      phone [phone]    fax - [phone]    qty = 60  Next Appointment Scheduled: Tues 5/15   lab Initial call taken by: Jerolyn Shin,  June 20, 2010 1:40 PM    Prescriptions: ATENOLOL 100 MG TABS (ATENOLOL) 1 by mouth two times a day  #60 x 3   Entered by:   Doristine Devoid CMA   Authorized by:   Loreen Freud DO   Signed by:   Doristine Devoid CMA on 06/20/2010   Method used:   Electronically to        [facility]* (retail)       [address]       [place], [place]  [address]       Ph: [phone]       Fax: [phone]   RxID:   [id number]

## 2010-07-06 ENCOUNTER — Other Ambulatory Visit (HOSPITAL_COMMUNITY)
Admission: RE | Admit: 2010-07-06 | Discharge: 2010-07-06 | Disposition: A | Discharge status: Home / Self Care | Payer: Medicaid Other | Source: Ambulatory Visit | Attending: Obstetrics and Gynecology | Admitting: Obstetrics and Gynecology

## 2010-07-06 ENCOUNTER — Other Ambulatory Visit: Payer: Self-pay | Admitting: Obstetrics and Gynecology

## 2010-07-06 ENCOUNTER — Encounter (INDEPENDENT_AMBULATORY_CARE_PROVIDER_SITE_OTHER): Payer: Medicaid Other | Admitting: Obstetrics and Gynecology

## 2010-07-06 DIAGNOSIS — N870 Mild cervical dysplasia: Secondary | ICD-10-CM | POA: Insufficient documentation

## 2010-07-06 DIAGNOSIS — B20 Human immunodeficiency virus [HIV] disease: Secondary | ICD-10-CM

## 2010-07-06 DIAGNOSIS — R87610 Atypical squamous cells of undetermined significance on cytologic smear of cervix (ASC-US): Secondary | ICD-10-CM

## 2010-07-07 ENCOUNTER — Other Ambulatory Visit: Payer: Self-pay

## 2010-07-07 ENCOUNTER — Other Ambulatory Visit (INDEPENDENT_AMBULATORY_CARE_PROVIDER_SITE_OTHER): Payer: Medicaid Other

## 2010-07-07 ENCOUNTER — Ambulatory Visit: Payer: Medicaid Other | Attending: Physical Medicine and Rehabilitation | Admitting: Physical Therapy

## 2010-07-07 DIAGNOSIS — M2560 Stiffness of unspecified joint, not elsewhere classified: Secondary | ICD-10-CM | POA: Insufficient documentation

## 2010-07-07 DIAGNOSIS — M6281 Muscle weakness (generalized): Secondary | ICD-10-CM | POA: Insufficient documentation

## 2010-07-07 DIAGNOSIS — M2550 Pain in unspecified joint: Secondary | ICD-10-CM | POA: Insufficient documentation

## 2010-07-07 DIAGNOSIS — IMO0001 Reserved for inherently not codable concepts without codable children: Secondary | ICD-10-CM | POA: Insufficient documentation

## 2010-07-07 DIAGNOSIS — B20 Human immunodeficiency virus [HIV] disease: Secondary | ICD-10-CM

## 2010-07-07 LAB — CBC WITH DIFFERENTIAL/PLATELET
Eosinophils Absolute: 0.2 10*3/uL (ref 0.0–0.7)
Hemoglobin: 14.8 g/dL (ref 12.0–15.0)
Lymphocytes Relative: 15 % (ref 12–46)
Lymphs Abs: 1 10*3/uL (ref 0.7–4.0)
MCH: 32.4 pg (ref 26.0–34.0)
MCV: 95.2 fL (ref 78.0–100.0)
Monocytes Relative: 9 % (ref 3–12)
Neutrophils Relative %: 72 % (ref 43–77)
RBC: 4.57 MIL/uL (ref 3.87–5.11)
WBC: 6.7 10*3/uL (ref 4.0–10.5)

## 2010-07-07 MED ORDER — HYDROCODONE-ACETAMINOPHEN 7.5-750 MG PO TABS: 1.0000 | ORAL_TABLET | Freq: Four times a day (QID) | ORAL | Status: DC | PRN

## 2010-07-07 NOTE — Telephone Encounter (Signed)
Last seen 05/13/10 and filled 06/06/10...Marland KitchenMarland KitchenMarland Kitchen    KP  Ok per [employer]    KP

## 2010-07-08 LAB — COMPLETE METABOLIC PANEL WITH GFR
ALT: 12 U/L (ref 0–35)
AST: 24 U/L (ref 0–37)
Creat: 0.88 mg/dL (ref 0.40–1.20)
GFR, Est African American: >60 mL/min (ref >60)
Total Bilirubin: 0.7 mg/dL (ref 0.3–1.2)

## 2010-07-08 LAB — T-HELPER CELL (CD4) - (RCID CLINIC ONLY): CD4 T Cell Abs: 400 uL (ref 400–2700)

## 2010-07-09 LAB — HIV-1 RNA QUANT-NO REFLEX-BLD
HIV 1 RNA Quant: <20 copies/mL (ref <20)
HIV-1 RNA Quant, Log: <1.3 {Log} (ref <1.30)

## 2010-07-12 ENCOUNTER — Other Ambulatory Visit: Payer: Self-pay

## 2010-07-12 MED ORDER — ATORVASTATIN CALCIUM 10 MG PO TABS: 10.0000 mg | ORAL_TABLET | Freq: Every day | ORAL | Status: DC

## 2010-07-14 LAB — T-HELPER CELL (CD4) - (RCID CLINIC ONLY)
CD4 % Helper T Cell: 40 % (ref 33–55)
CD4 T Cell Abs: 240 uL — ABNORMAL LOW (ref 400–2700)

## 2010-07-22 ENCOUNTER — Ambulatory Visit (INDEPENDENT_AMBULATORY_CARE_PROVIDER_SITE_OTHER): Payer: Medicaid Other | Admitting: Adult Health

## 2010-07-22 ENCOUNTER — Encounter: Payer: Self-pay | Admitting: Adult Health

## 2010-07-22 VITALS — Temp 97.8°F | Ht 65.0 in | Wt 176.0 lb

## 2010-07-22 DIAGNOSIS — F411 Generalized anxiety disorder: Secondary | ICD-10-CM

## 2010-07-22 DIAGNOSIS — F419 Anxiety disorder, unspecified: Secondary | ICD-10-CM

## 2010-07-22 DIAGNOSIS — E785 Hyperlipidemia, unspecified: Secondary | ICD-10-CM

## 2010-07-22 DIAGNOSIS — M62838 Other muscle spasm: Secondary | ICD-10-CM

## 2010-07-22 DIAGNOSIS — B20 Human immunodeficiency virus [HIV] disease: Secondary | ICD-10-CM

## 2010-07-22 DIAGNOSIS — I10 Essential (primary) hypertension: Secondary | ICD-10-CM

## 2010-07-22 DIAGNOSIS — Z111 Encounter for screening for respiratory tuberculosis: Secondary | ICD-10-CM

## 2010-07-22 NOTE — Progress Notes (Signed)
  Subjective:    Patient ID: Latoya Wilson, female    DOB: 1960-07-24, 50 y.o.   MRN: [id number]  HPI Presents to clinic today for followup. She does adherence to her HIV medications with good tolerance. No side effects. Does relate that she was started on Lipitor by one of her other physicians, but she is not certain of the dose. She continues to have problems with chronic pain of an unspecified nature, and recurrent anxiety attacks, for which she receives lorazepam therapy. She voices no physical complaints today and states she is feeling generally well. Right now. She did not bring her list of medications with her and was unable to endorse and reconcile her medication profile with the MA today and states she will call to clinic either today or next week with that profile.   Review of Systems  Constitutional: Negative.   HENT: Negative.   Eyes: Negative.   Respiratory: Negative.   Cardiovascular: Negative.   Gastrointestinal: Negative.   Genitourinary: Negative.   Musculoskeletal: Negative.   Skin: Negative.   Neurological: Negative.   Hematological: Negative.   Psychiatric/Behavioral: Negative.        Objective:   Physical Exam  Constitutional: She is oriented to person, place, and time. She appears well-developed and well-nourished. No distress.  HENT:  Head: Normocephalic and atraumatic.  Right Ear: External ear normal.  Left Ear: External ear normal.  Nose: Nose normal.  Mouth/Throat: Oropharynx is clear and moist. No oropharyngeal exudate.  Eyes: Conjunctivae and EOM are normal. Pupils are equal, round, and reactive to light. No scleral icterus.  Neck: Normal range of motion. Neck supple. No thyromegaly present.  Cardiovascular: Normal rate, regular rhythm, normal heart sounds and intact distal pulses.  Exam reveals no gallop and no friction rub.   No murmur heard. Pulmonary/Chest: Effort normal and breath sounds normal.  Abdominal: Soft. Bowel sounds are normal. She  exhibits no distension. There is no tenderness.  Musculoskeletal: Normal range of motion. She exhibits no edema and no tenderness.  Lymphadenopathy:    She has no cervical adenopathy.  Neurological: She is alert and oriented to person, place, and time. No cranial nerve deficit. She exhibits normal muscle tone. Coordination normal.  Skin: Skin is warm and dry. No rash noted. She is not diaphoretic.  Psychiatric: She has a normal mood and affect. Her behavior is normal. Judgment and thought content normal.          Assessment & Plan:  1. HIV. Labs from 07/07/2010 show a CD4 count of 400 at 40% with a viral load of less than 20 copies per mL. Based on review of her lab profile. She is clinically stable on her current regimen. We will CPM for now and ask that she return for followup in 6 months and come to clinic 2 weeks before that appointment for fasting labs. She verbally acknowledged this and agreed with plan. She was instructed to continue her followup with her other specialists and to contact the clinic as soon as she can with the list of medications. She currently is taking.

## 2010-07-27 ENCOUNTER — Ambulatory Visit: Payer: Medicaid Other | Admitting: Obstetrics and Gynecology

## 2010-07-29 ENCOUNTER — Ambulatory Visit (INDEPENDENT_AMBULATORY_CARE_PROVIDER_SITE_OTHER): Payer: Medicaid Other | Admitting: Obstetrics and Gynecology

## 2010-07-29 DIAGNOSIS — B20 Human immunodeficiency virus [HIV] disease: Secondary | ICD-10-CM

## 2010-07-29 DIAGNOSIS — R87610 Atypical squamous cells of undetermined significance on cytologic smear of cervix (ASC-US): Secondary | ICD-10-CM

## 2010-07-30 NOTE — Group Therapy Note (Signed)
Latoya Wilson, Latoya Wilson                ACCOUNT NO.:  [id number]  MEDICAL RECORD NO.:  [id number]           PATIENT TYPE:  A  LOCATION:  [facility]                   FACILITY:  [facility]  PHYSICIAN:  Catalina Antigua, MD     DATE OF BIRTH:  Oct 07, 1960  DATE OF SERVICE:  07/29/2010                                 CLINIC NOTE  This is a 50 year old G2, P2 with positive HIV who presents today to discuss the results of her colposcopy.  The patient has ASCUS positive HPV Pap smear in February 2012, followed by colposcopy, which showed low- grade squamous intraepithelial lesions.  The patient was informed of these results.  The patient was counseled regarding the need for repeat Pap smear in 6 months.  The patient verbalized understanding.  The patient did express, however, that if the abnormalities persist that she would be interested in cryo of her cervix.  The patient will therefore return in 6 months for repeat Pap smear and further management thereafter.          ______________________________ Catalina Antigua, MD    PC/MEDQ  D:  07/29/2010  T:  07/29/2010  Job:  [id number]

## 2010-08-02 ENCOUNTER — Other Ambulatory Visit: Payer: Self-pay

## 2010-08-02 MED ORDER — ALBUTEROL 90 MCG/ACT IN AERS: 2.0000 | INHALATION_SPRAY | Freq: Four times a day (QID) | RESPIRATORY_TRACT | Status: DC | PRN

## 2010-08-15 ENCOUNTER — Other Ambulatory Visit: Payer: Self-pay | Admitting: *Deleted

## 2010-08-15 DIAGNOSIS — E785 Hyperlipidemia, unspecified: Secondary | ICD-10-CM

## 2010-08-16 ENCOUNTER — Other Ambulatory Visit: Payer: Medicaid Other

## 2010-08-16 NOTE — Group Therapy Note (Signed)
Latoya Wilson, Latoya Wilson                ACCOUNT NO.:  [id number]   MEDICAL RECORD NO.:  [id number]          PATIENT TYPE:  WOC   [facility]ATION[facility]                   FACILITY:  [facility]   PHYSICIAN:  Argentina Donovan, MD        DATE OF BIRTH:  08/18/1960   DATE OF SERVICE:  09/04/2007                                  CLINIC NOTE   The patient is a 50 year old gravida 2, para 2-0-0-2, HIV positive who  had previously been referred by [facility] for vaginal  lesions.  She was treated with trichloroacetic acid for condyloma  acuminata.  Came back today for a followup and also to get a Pap smear.  The Pap smear was done on routine examination.  On examination, external  genitalia look completely normal with no sign of any condylomata.  Vagina was clean and well rugated.  BUS was within normal limits.  The  cervix was clean and parous.  A Pap smear was taken.  The uterus was  anterior, normal size, shape, consistency, and the adnexa was normal.  On examination of the anal area, the patient had been complaining of  some pain.  There were two 3-mm circular umbilicated lesions to the left  of the anus at approximately 4 o'clock.  They were approximately 5 mm  apart.  They looked to me as if they were molluscum contagiosum.  I told  the patient that I was not sure, but that if they do not go away by  themselves within several weeks to come back and we will do a biopsy of  these areas.   IMPRESSION:  1. Condyloma acuminata, resolved.  2. Anal lesions, undetermined origin.  3. Human immunodeficiency virus positive.   In addition to this, the patient has been treated in the past for  multiple sclerosis, which apparently she did not have, had terrible side  effects from the medicine they gave her at the time.  She had  generalized myalgias and pain, cutaneous pain and burning.  No diagnosis  has been made by this although she is being followed by appropriate  physicians.     ______________________________  Argentina Donovan, MD     PR/MEDQ  D:  09/04/2007  T:  09/04/2007  Job:  [id number]

## 2010-08-16 NOTE — Group Therapy Note (Signed)
NAMEDONNEISHA, BEANE                ACCOUNT NO.:  [id number]   MEDICAL RECORD NO.:  [id number]          PATIENT TYPE:  WOC   [facility]ATION[facility]                   [facility]ACILITY:  [facility]   PHYSICIAN:  Karlton Lemon, MD      DATE OF BIRTH:  22-Dec-1960   DATE OF SERVICE:                                  CLINIC NOTE   CHIEF COMPLAINT:  Referral for a vaginal lesions in an HIV positive  patient.   HISTORY OF PRESENT ILLNESS:  This is a 50 year old gravida 2, para 2,  who is HIV positive, referred by the [facility] for vaginal lesions.  The patient says they have been  present for 1-1/2 months, and has noted a couple on the left side of her  labia, external genitalia.  She notes no increase in size.  They are not  painful or irritating.  She would like them to be evaluated today.   PAST MEDICAL HISTORY:  1. Human immunodeficiency virus positive.  2. Asthma.  3. Hypertension.  4. Hypercholesterolemia.   PAST OBSTETRICAL HISTORY:  The patient has had two term vaginal  deliveries.   GYNECOLOGIC HISTORY:  The patient has never had an abnormal Pap smear.   PAST SURGICAL HISTORY:  No known surgeries.   ALLERGIES:  No known drug allergies.   MEDICATIONS:  1. Albuterol inhaler p.r.n.  2. Lyrica, one p.o. three times daily p.r.n.  3. Fish oil.  4. HIV medications:  The patient is uncertain of what medications they      are.   FAMILY HISTORY:  Mitral valve prolapse in her mother.  Hypertension.  Her father had an acute myocardial infarction.   SOCIAL HISTORY:  The patient smokes one pack per day for the past 34  years.  She denies alcohol or illegal drug use.   PHYSICAL EXAMINATION:  GENERAL:  This is a well-appearing female, in no  distress.  VITAL SIGNS:  Temperature 98.1 degrees, pulse 87, respirations 20, blood  pressure 137/83, weight 150.3 pounds.  CARDIOVASCULAR:  Regular rate and rhythm.  No murmurs, rubs or gallops.  LUNGS:  Clear to  auscultation bilaterally.  GENITOURINARY:  External genitalia has condylomatous HPV lesions noted,  about 3 to 4 near the right labia.  There is one at 6 o'clock right  outside the introitus and there is one around the anus.  There are also  a few at 12 o'clock at the introitus.   ASSESSMENT/PLAN:  This is a 50 year old gravida 2, para 2 with condyloma  of the external genitalia:  They were treated with Tri-Chlor acetic acid  today.   FOLLOWUP:  The patient is to follow up in two weeks for re-assessment  and possible re-treatment.  Also at that time she is to have a Pap  smear.           ______________________________  Karlton Lemon, MD     NS/MEDQ  D:  08/21/2007  T:  08/21/2007  Job:  [id number]

## 2010-08-20 ENCOUNTER — Emergency Department (HOSPITAL_COMMUNITY)
Admission: EM | Admit: 2010-08-20 | Discharge: 2010-08-21 | Disposition: A | Discharge status: Home / Self Care | Payer: Medicaid Other | Attending: Emergency Medicine | Admitting: Emergency Medicine

## 2010-08-20 DIAGNOSIS — J45909 Unspecified asthma, uncomplicated: Secondary | ICD-10-CM | POA: Insufficient documentation

## 2010-08-20 DIAGNOSIS — IMO0001 Reserved for inherently not codable concepts without codable children: Secondary | ICD-10-CM | POA: Insufficient documentation

## 2010-08-20 DIAGNOSIS — Z21 Asymptomatic human immunodeficiency virus [HIV] infection status: Secondary | ICD-10-CM | POA: Insufficient documentation

## 2010-08-20 DIAGNOSIS — F319 Bipolar disorder, unspecified: Secondary | ICD-10-CM | POA: Insufficient documentation

## 2010-08-20 DIAGNOSIS — F411 Generalized anxiety disorder: Secondary | ICD-10-CM | POA: Insufficient documentation

## 2010-08-20 DIAGNOSIS — R209 Unspecified disturbances of skin sensation: Secondary | ICD-10-CM | POA: Insufficient documentation

## 2010-08-20 DIAGNOSIS — M79609 Pain in unspecified limb: Secondary | ICD-10-CM | POA: Insufficient documentation

## 2010-08-20 LAB — POCT I-STAT, CHEM 8
Calcium, Ion: 1.12 mmol/L (ref 1.12–1.32)
Chloride: 102 mEq/L (ref 96–112)
Glucose, Bld: 89 mg/dL (ref 70–99)
HCT: 38 % (ref 36.0–46.0)

## 2010-09-05 ENCOUNTER — Other Ambulatory Visit: Payer: Self-pay | Admitting: Family Medicine

## 2010-09-05 ENCOUNTER — Other Ambulatory Visit: Payer: Self-pay | Admitting: *Deleted

## 2010-09-05 DIAGNOSIS — B20 Human immunodeficiency virus [HIV] disease: Secondary | ICD-10-CM

## 2010-09-05 MED ORDER — ATAZANAVIR SULFATE 300 MG PO CAPS: 300.0000 mg | ORAL_CAPSULE | Freq: Every day | ORAL | Status: DC

## 2010-09-05 MED ORDER — ATORVASTATIN CALCIUM 10 MG PO TABS: 10.0000 mg | ORAL_TABLET | Freq: Every day | ORAL | Status: DC

## 2010-09-05 NOTE — Telephone Encounter (Signed)
Faxed.   KP 

## 2010-10-03 ENCOUNTER — Other Ambulatory Visit: Payer: Self-pay | Admitting: *Deleted

## 2010-10-03 ENCOUNTER — Other Ambulatory Visit: Payer: Self-pay

## 2010-10-03 DIAGNOSIS — B20 Human immunodeficiency virus [HIV] disease: Secondary | ICD-10-CM

## 2010-10-03 MED ORDER — ALBUTEROL 90 MCG/ACT IN AERS: 2.0000 | INHALATION_SPRAY | Freq: Four times a day (QID) | RESPIRATORY_TRACT | Status: DC | PRN

## 2010-10-03 MED ORDER — RITONAVIR 100 MG PO CAPS: 100.0000 mg | ORAL_CAPSULE | Freq: Every day | ORAL | Status: DC

## 2010-10-03 MED ORDER — EMTRICITABINE-TENOFOVIR DF 200-300 MG PO TABS: 1.0000 | ORAL_TABLET | Freq: Every day | ORAL | Status: DC

## 2010-10-03 MED ORDER — ATORVASTATIN CALCIUM 10 MG PO TABS: 10.0000 mg | ORAL_TABLET | Freq: Every day | ORAL | Status: DC

## 2010-10-03 NOTE — Telephone Encounter (Signed)
Rx faxed---repeat labs are due now   KP

## 2010-11-02 ENCOUNTER — Other Ambulatory Visit: Payer: Self-pay | Admitting: Family Medicine

## 2010-11-02 MED ORDER — ATORVASTATIN CALCIUM 10 MG PO TABS: 10.0000 mg | ORAL_TABLET | Freq: Every day | ORAL | Status: DC

## 2010-11-02 NOTE — Telephone Encounter (Signed)
Done

## 2010-12-07 ENCOUNTER — Other Ambulatory Visit: Payer: Self-pay | Admitting: Family Medicine

## 2010-12-07 MED ORDER — ATORVASTATIN CALCIUM 10 MG PO TABS: 10.0000 mg | ORAL_TABLET | Freq: Every day | ORAL | Status: DC

## 2010-12-07 NOTE — Telephone Encounter (Signed)
Faxed---letter mailed    Kp

## 2010-12-22 LAB — T-HELPER CELL (CD4) - (RCID CLINIC ONLY)
CD4 % Helper T Cell: 28 — ABNORMAL LOW
CD4 T Cell Abs: 200 — ABNORMAL LOW

## 2010-12-26 LAB — T-HELPER CELL (CD4) - (RCID CLINIC ONLY): CD4 % Helper T Cell: 30 — ABNORMAL LOW

## 2010-12-30 LAB — T-HELPER CELL (CD4) - (RCID CLINIC ONLY)
CD4 % Helper T Cell: 40
CD4 T Cell Abs: 370 — ABNORMAL LOW

## 2011-01-18 ENCOUNTER — Other Ambulatory Visit (INDEPENDENT_AMBULATORY_CARE_PROVIDER_SITE_OTHER): Payer: Medicaid Other

## 2011-01-18 ENCOUNTER — Other Ambulatory Visit: Payer: Self-pay | Admitting: Infectious Diseases

## 2011-01-18 DIAGNOSIS — E785 Hyperlipidemia, unspecified: Secondary | ICD-10-CM

## 2011-01-18 DIAGNOSIS — B20 Human immunodeficiency virus [HIV] disease: Secondary | ICD-10-CM

## 2011-01-19 LAB — COMPREHENSIVE METABOLIC PANEL
Alkaline Phosphatase: 96 U/L (ref 39–117)
BUN: 9 mg/dL (ref 6–23)
Creat: 0.77 mg/dL (ref 0.50–1.10)
Glucose, Bld: 100 mg/dL — ABNORMAL HIGH (ref 70–99)
Sodium: 141 mEq/L (ref 135–145)
Total Bilirubin: 1.6 mg/dL — ABNORMAL HIGH (ref 0.3–1.2)

## 2011-01-19 LAB — LIPID PANEL
HDL: 44 mg/dL (ref >39)
LDL Cholesterol: 113 mg/dL — ABNORMAL HIGH (ref 0–99)
Triglycerides: 110 mg/dL (ref <150)
VLDL: 22 mg/dL (ref 0–40)

## 2011-01-19 LAB — CBC WITH DIFFERENTIAL/PLATELET
Basophils Absolute: 0 10*3/uL (ref 0.0–0.1)
Basophils Relative: 1 % (ref 0–1)
Eosinophils Absolute: 0.2 10*3/uL (ref 0.0–0.7)
Eosinophils Relative: 3 % (ref 0–5)
Lymphocytes Relative: 26 % (ref 12–46)
MCV: 92.5 fL (ref 78.0–100.0)
Platelets: 235 10*3/uL (ref 150–400)
RDW: 12.4 % (ref 11.5–15.5)
WBC: 5.6 10*3/uL (ref 4.0–10.5)

## 2011-01-23 ENCOUNTER — Other Ambulatory Visit: Payer: Self-pay | Admitting: Family Medicine

## 2011-01-23 NOTE — Telephone Encounter (Signed)
Lipid profile done--Pending review from [facility]

## 2011-01-23 NOTE — Telephone Encounter (Signed)
Increase Lipitor to 20 mg qhs--- #30  2 refills Recheck 3 months----272.4  Lipid, hep

## 2011-01-24 MED ORDER — ATORVASTATIN CALCIUM 20 MG PO TABS: 20.0000 mg | ORAL_TABLET | Freq: Every day | ORAL | Status: DC

## 2011-01-24 NOTE — Telephone Encounter (Signed)
msg left to make patient aware    KP

## 2011-02-01 ENCOUNTER — Ambulatory Visit: Payer: Medicaid Other | Admitting: Infectious Diseases

## 2011-03-06 ENCOUNTER — Encounter: Payer: Self-pay | Admitting: Infectious Diseases

## 2011-03-06 ENCOUNTER — Ambulatory Visit (INDEPENDENT_AMBULATORY_CARE_PROVIDER_SITE_OTHER): Payer: Self-pay | Admitting: Infectious Diseases

## 2011-03-06 VITALS — BP 146/92 | HR 73 | Temp 98.0°F | Ht 65.0 in | Wt 166.0 lb

## 2011-03-06 DIAGNOSIS — I10 Essential (primary) hypertension: Secondary | ICD-10-CM

## 2011-03-06 DIAGNOSIS — Z23 Encounter for immunization: Secondary | ICD-10-CM

## 2011-03-06 DIAGNOSIS — Z113 Encounter for screening for infections with a predominantly sexual mode of transmission: Secondary | ICD-10-CM

## 2011-03-06 DIAGNOSIS — Z79899 Other long term (current) drug therapy: Secondary | ICD-10-CM

## 2011-03-06 DIAGNOSIS — B20 Human immunodeficiency virus [HIV] disease: Secondary | ICD-10-CM

## 2011-03-06 DIAGNOSIS — Z87891 Personal history of nicotine dependence: Secondary | ICD-10-CM

## 2011-03-06 DIAGNOSIS — F319 Bipolar disorder, unspecified: Secondary | ICD-10-CM

## 2011-03-06 NOTE — Assessment & Plan Note (Signed)
She f/u with her PCP. Greatly appreciate their partnering with [place].

## 2011-03-06 NOTE — Progress Notes (Signed)
  Subjective:    Patient ID: Latoya Wilson, female    DOB: [birth year], 50 y.o.   MRN: [id number]  HPI 50 yo F with xhx of HIV+, HTN, bipolar d/o. Has gotten primary care MD since last visit. Gotten steroid injection into her arm to help with pain. Has had better ROM since. She has been taking the generic of provigil to help her with her narcolepsy. States she is not depressed and is feeding herself positive messages.  Had abn pap smear (CIN1 April 2012). States she had repeat that was less abnormal. Mammo done earlier this year was abn due to a "mole".  states she had repeat that was normal. Last CD4 720 and VL <20 (01-18-11). Taking ATVr/TRV.    Review of Systems  Constitutional: Negative for fever and chills.  Respiratory: Negative for shortness of breath.   Gastrointestinal: Negative for diarrhea and constipation.  Genitourinary: Negative for dysuria.       Objective:   Physical Exam  Constitutional: She appears well-developed and well-nourished.  Eyes: EOM are normal. Pupils are equal, round, and reactive to light.  Neck: Neck supple.  Cardiovascular: Normal rate, regular rhythm and normal heart sounds.   Pulmonary/Chest: Effort normal and breath sounds normal.  Abdominal: Soft. Bowel sounds are normal. There is no tenderness.  Musculoskeletal: She exhibits no edema.  Lymphadenopathy:    She has no cervical adenopathy.  Psychiatric: Her affect is not angry and not labile. Her speech is rapid and/or pressured. She is is hyperactive. She is not agitated, not aggressive, not slowed, not withdrawn, not actively hallucinating and not combative. She does not exhibit a depressed mood. She is attentive.          Assessment & Plan:

## 2011-03-06 NOTE — Assessment & Plan Note (Signed)
She wants to quit. Is going to try e-cigarettes.

## 2011-03-06 NOTE — Assessment & Plan Note (Signed)
She is doing well. CD4 is Normal. Gets flu and condoms today. Will see her back in 5 months. No change in meds. She needs repeat mammo and PAP at about that time.

## 2011-03-06 NOTE — Assessment & Plan Note (Signed)
She is on Rx for this. Today she is pressured speech but thought processes are relatively normal.

## 2011-03-20 ENCOUNTER — Other Ambulatory Visit: Payer: Self-pay | Admitting: Family Medicine

## 2011-03-20 MED ORDER — ATORVASTATIN CALCIUM 20 MG PO TABS: 20.0000 mg | ORAL_TABLET | Freq: Every day | ORAL | Status: DC

## 2011-03-20 NOTE — Telephone Encounter (Signed)
Faxed.   KP 

## 2011-04-11 ENCOUNTER — Telehealth: Payer: Self-pay

## 2011-04-11 DIAGNOSIS — B20 Human immunodeficiency virus [HIV] disease: Secondary | ICD-10-CM

## 2011-04-11 MED ORDER — ATAZANAVIR SULFATE 300 MG PO CAPS: 300.0000 mg | ORAL_CAPSULE | Freq: Every day | ORAL | Status: DC

## 2011-04-11 MED ORDER — EMTRICITABINE-TENOFOVIR DF 200-300 MG PO TABS: 1.0000 | ORAL_TABLET | Freq: Every day | ORAL | Status: DC

## 2011-04-11 MED ORDER — RITONAVIR 100 MG PO CAPS: 100.0000 mg | ORAL_CAPSULE | Freq: Every day | ORAL | Status: DC

## 2011-05-05 ENCOUNTER — Other Ambulatory Visit: Payer: Self-pay | Admitting: Family Medicine

## 2011-05-05 MED ORDER — ATORVASTATIN CALCIUM 20 MG PO TABS: 20.0000 mg | ORAL_TABLET | Freq: Every day | ORAL | Status: DC

## 2011-05-05 NOTE — Telephone Encounter (Signed)
Letter mailed to schedule and apt    KP 

## 2011-05-18 ENCOUNTER — Emergency Department (INDEPENDENT_AMBULATORY_CARE_PROVIDER_SITE_OTHER): Payer: Medicaid Other

## 2011-05-18 ENCOUNTER — Other Ambulatory Visit: Payer: Self-pay

## 2011-05-18 ENCOUNTER — Encounter (HOSPITAL_BASED_OUTPATIENT_CLINIC_OR_DEPARTMENT_OTHER): Payer: Self-pay | Admitting: *Deleted

## 2011-05-18 ENCOUNTER — Emergency Department (HOSPITAL_BASED_OUTPATIENT_CLINIC_OR_DEPARTMENT_OTHER)
Admission: EM | Admit: 2011-05-18 | Discharge: 2011-05-19 | Disposition: A | Discharge status: Home / Self Care | Payer: Medicaid Other | Attending: Emergency Medicine | Admitting: Emergency Medicine

## 2011-05-18 DIAGNOSIS — R55 Syncope and collapse: Secondary | ICD-10-CM

## 2011-05-18 DIAGNOSIS — M542 Cervicalgia: Secondary | ICD-10-CM

## 2011-05-18 DIAGNOSIS — F319 Bipolar disorder, unspecified: Secondary | ICD-10-CM | POA: Insufficient documentation

## 2011-05-18 DIAGNOSIS — R51 Headache: Secondary | ICD-10-CM | POA: Insufficient documentation

## 2011-05-18 DIAGNOSIS — W19XXXA Unspecified fall, initial encounter: Secondary | ICD-10-CM

## 2011-05-18 DIAGNOSIS — R0789 Other chest pain: Secondary | ICD-10-CM

## 2011-05-18 DIAGNOSIS — Z79899 Other long term (current) drug therapy: Secondary | ICD-10-CM | POA: Insufficient documentation

## 2011-05-18 DIAGNOSIS — I10 Essential (primary) hypertension: Secondary | ICD-10-CM | POA: Insufficient documentation

## 2011-05-18 DIAGNOSIS — R4182 Altered mental status, unspecified: Secondary | ICD-10-CM

## 2011-05-18 DIAGNOSIS — Z21 Asymptomatic human immunodeficiency virus [HIV] infection status: Secondary | ICD-10-CM | POA: Insufficient documentation

## 2011-05-18 DIAGNOSIS — G35 Multiple sclerosis: Secondary | ICD-10-CM | POA: Insufficient documentation

## 2011-05-18 DIAGNOSIS — F17200 Nicotine dependence, unspecified, uncomplicated: Secondary | ICD-10-CM | POA: Insufficient documentation

## 2011-05-18 DIAGNOSIS — E785 Hyperlipidemia, unspecified: Secondary | ICD-10-CM | POA: Insufficient documentation

## 2011-05-18 LAB — DIFFERENTIAL
Eosinophils Absolute: 0.1 10*3/uL (ref 0.0–0.7)
Eosinophils Relative: 2 % (ref 0–5)
Lymphs Abs: 2.4 10*3/uL (ref 0.7–4.0)
Monocytes Relative: 9 % (ref 3–12)

## 2011-05-18 LAB — URINALYSIS, ROUTINE W REFLEX MICROSCOPIC
Glucose, UA: NEGATIVE mg/dL
Hgb urine dipstick: NEGATIVE
Ketones, ur: 15 mg/dL — AB
Protein, ur: NEGATIVE mg/dL

## 2011-05-18 LAB — COMPREHENSIVE METABOLIC PANEL
AST: 24 U/L (ref 0–37)
Albumin: 3.6 g/dL (ref 3.5–5.2)
Calcium: 9.1 mg/dL (ref 8.4–10.5)
Chloride: 101 mEq/L (ref 96–112)
Creatinine, Ser: 0.9 mg/dL (ref 0.50–1.10)
Sodium: 138 mEq/L (ref 135–145)
Total Bilirubin: 1.7 mg/dL — ABNORMAL HIGH (ref 0.3–1.2)

## 2011-05-18 LAB — CBC
Hemoglobin: 14.7 g/dL (ref 12.0–15.0)
MCH: 32.8 pg (ref 26.0–34.0)
MCV: 93.8 fL (ref 78.0–100.0)
RBC: 4.48 MIL/uL (ref 3.87–5.11)

## 2011-05-18 NOTE — ED Notes (Signed)
C/o neck and head pain after falling today, +LOC per pt

## 2011-05-18 NOTE — ED Notes (Signed)
Pt reports having a syncopal event at home which resulted in a fall, no trauma noted, c/o neck and shoulder pain, c-collar applied in triage

## 2011-05-18 NOTE — ED Notes (Signed)
Pt has full ROM with neck, ambulates without assist.

## 2011-05-18 NOTE — ED Provider Notes (Signed)
Date: 05/18/2011  Rate: 79  Rhythm: normal sinus rhythm  QRS Axis: right  Intervals: PR prolonged  ST/T Wave abnormalities: normal  Conduction Disutrbances:first-degree A-V block   Narrative Interpretation: Borderline EKG.  Old EKG Reviewed: none available    Carleene Cooper III, MD 05/18/11 2148

## 2011-05-18 NOTE — ED Notes (Signed)
Cervical collar applied, pt tolerated well.

## 2011-05-18 NOTE — ED Provider Notes (Signed)
History     CSN: [id number]  Arrival date & time 05/18/11  2056   First MD Initiated Contact with Patient 05/18/11 2302      Chief Complaint  Patient presents with  . Neck Pain  . Headache    HPI The patient presents following a syncopal episode.  The patient has history of seizures in the distant past, no current antiepileptic medications.  She is HIV positive and also has multiple sclerosis. The patient notes that she was in her usual state of health until just prior to arrival.  After a brief period of lightheadedness, the patient laid down for 30 minutes.  Soon after awakening, while standing upright, the patient lost consciousness and awoke on her bathroom floor.  She notes a near immediate return to full consciousness, no post syncope chest pain, dyspnea, nausea.  She does note mild pain diffusely across her head, mid neck since the event.  The pain is throbbing, tight.  No attempts at analgesia thus far.  No discoordination, no ataxia, no other complaints on arrival. Past Medical History  Diagnosis Date  . HIV infection   . Hypertension   . Hyperlipidemia   . Bipolar affective disorder     History reviewed. No pertinent past surgical history.  Family History  Problem Relation Age of Onset  . Mitral valve prolapse Mother   . Heart disease Father     History  Substance Use Topics  . Smoking status: Current Everyday Smoker -- 1.0 packs/day for 35 years    Types: Cigarettes  . Smokeless tobacco: Never Used  . Alcohol Use: No    OB History    Grav Para Term Preterm Abortions TAB SAB Ect Mult Living                  Review of Systems  Constitutional:       HPI  HENT:       HPI otherwise negative  Eyes: Negative.   Respiratory:       HPI, otherwise negative  Cardiovascular:       HPI, otherwise nmegative  Gastrointestinal: Negative for vomiting.  Genitourinary:       HPI, otherwise negative  Musculoskeletal:       HPI, otherwise negative  Skin: Negative.    Neurological: Negative for syncope.    Allergies  Review of patient's allergies indicates no known allergies.  Home Medications   Current Outpatient Rx  Name Route Sig Dispense Refill  . ALBUTEROL 90 MCG/ACT IN AERS Inhalation Inhale 2 puffs into the lungs 4 (four) times daily as needed. For wheezing    . ASPIRIN 81 MG PO TABS Oral Take 81 mg by mouth daily.    . ATAZANAVIR SULFATE 300 MG PO CAPS Oral Take 1 capsule (300 mg total) by mouth daily with breakfast. 30 capsule 6  . ATENOLOL 100 MG PO TABS Oral Take 100 mg by mouth daily.     . ATORVASTATIN CALCIUM 20 MG PO TABS Oral Take 1 tablet (20 mg total) by mouth daily. 30 tablet 0    OV with fasting labs due now  . CYANOCOBALAMIN 1000 MCG/ML IJ SOLN Intramuscular Inject 1,000 mcg into the muscle every 30 (thirty) days. Around the 5th of every month    . DIVALPROEX SODIUM ER 500 MG PO TB24 Oral Take 1,500 mg by mouth at bedtime.    Marland Kitchen EMTRICITABINE-TENOFOVIR 200-300 MG PO TABS Oral Take 1 tablet by mouth daily. 30 tablet 6  . LORAZEPAM 0.5  MG PO TABS Oral Take 0.5 mg by mouth 3 (three) times daily. For anxiety     . MIRTAZAPINE 15 MG PO TABS Oral Take 15 mg by mouth at bedtime.    Marland Kitchen MODAFINIL 100 MG PO TABS Oral Take 100 mg by mouth daily.      Marland Kitchen PREGABALIN 150 MG PO CAPS Oral Take 300 mg by mouth 2 (two) times daily.     Marland Kitchen RITONAVIR 100 MG PO CAPS Oral Take 1 capsule (100 mg total) by mouth daily. 30 capsule 6  . ROPINIROLE HCL 1 MG PO TABS Oral Take 2 mg by mouth every evening. 1-2 hours before bedtime    . VENLAFAXINE HCL ER 150 MG PO CP24 Oral Take 300 mg by mouth every morning.      BP 128/85  Pulse 89  Temp(Src) 97.7 F (36.5 C) (Oral)  Resp 18  Ht 5\' 5"  (1.651 m)  Wt 160 lb (72.576 kg)  BMI 26.63 kg/m2  SpO2 100%  Physical Exam  Nursing note and vitals reviewed. Constitutional: She is oriented to person, place, and time. She appears well-developed and well-nourished. No distress.  HENT:  Head: Normocephalic and  atraumatic.  Mouth/Throat: Uvula is midline and oropharynx is clear and moist.  Eyes: Conjunctivae and EOM are normal. Pupils are equal, round, and reactive to light.  Neck: Neck supple. Muscular tenderness present. No spinous process tenderness present.       Patient notes mid C-spine pain with lateral rotation  Cardiovascular: Normal rate and regular rhythm.   Pulmonary/Chest: Effort normal and breath sounds normal. No stridor. No respiratory distress.  Abdominal: She exhibits no distension.  Musculoskeletal: She exhibits no edema.  Neurological: She is alert and oriented to person, place, and time. No cranial nerve deficit. She exhibits normal muscle tone. Coordination normal.  Skin: Skin is warm and dry.  Psychiatric: She has a normal mood and affect.    ED Course  Procedures (including critical care time)   Labs Reviewed  COMPREHENSIVE METABOLIC PANEL  CBC  DIFFERENTIAL  URINALYSIS, ROUTINE W REFLEX MICROSCOPIC   No results found.   No diagnosis found.  CT, chest x-ray reviewed by me Cardiac monitor 90, sinus rhythm, normal Pulse oximetry 100% room air, normal    MDM  This female with multiple medical problems, including HIV, MS, possible seizure history now presents following a possible syncopal episode with neck pain.  On exam she is in no distress though there is mild limitation to her range of motion of the neck.  The patient's story is consistent with either seizure or syncope, though the absence of significant postictal period is more consistent with syncope.  The absence of any chest pain, dyspnea, nausea, confusion throughout is somewhat reassuring, the patient does describe a mild prodrome which is possibly consistent with vagal etiology.  Absence of notable lab or radiographic findings is reassuring.  A prolonged discussion was conducted with the patient and her colleague regarding the necessity for continued evaluation, specifically cardiac monitoring via Holter  device.  She was discharged in stable condition with instructions to followup today with her physician, and to return for any concerning changes in her condition prior to that evaluation.        Gerhard Munch, MD 05/19/11 [phone]

## 2011-05-19 NOTE — Discharge Instructions (Signed)
As discussed, it is important that you continue to have your condition evaluated by your physician.  Please be sure to call tomorrow to arrange prompt followup and to discuss cardiac monitoring.  If you develop any new, or concerning changes before you have a chance to speak with your physician, please return to the emergency department expeditiously.  Cardiac Event Monitoring A cardiac event monitor is a small recording device used to help detect abnormal heart rhythms. When the heart does not beat in a stable or regular pattern, this is called an arrhythmia. An arrhythmia may or may not be felt. Palpitations, or fast heart beats, may be felt. A cardiac event monitor is used to record the heart rhythm when noticeable symptoms such as the following occur:  Palpitations, such as heart racing or fluttering.   Dizziness.   Fainting or lightheadedness.   Unexplained weakness.  The monitor is wired to two electrodes placed on your chest. Electrodes are flat sticky disks that attach to your skin. The monitor stores the heart rhythm and can be worn for up to 30 days. You will wear the monitor at all times, except when bathing. When you feel symptoms of heart trouble, such as dizziness, weakness, lightheadedness, palpitations, "thumping," shortness of breath or a fluttering or racing heart, you will push a button on the monitor to record the heart rhythm.  Your caregiver will also ask you to keep a diary of your activities, such as walking, doing chores and taking medicine. Be sure to note what you are doing when you experience heart symptoms. This will help your caregiver determine what might be contributing to your symptoms. The information stored in your monitor will be reviewed by your caregiver alongside your diary entries.  LET YOUR CAREGIVER KNOW ABOUT: Allergies to any kind of medical tape. PROCEDURE  A technician will prepare your chest for the electrode placement. The technician will show you how  to place the electrodes, how to work the monitor, how to replace the batteries and how to fill in your diary. Take time to practice using the monitor before you leave the office. Make sure you understand how to send the information from the monitor to your doctor. This requires a telephone with a land line, not a cellphone.   If you start to feel lightheaded, dizzy, weak or have fluttering or racing sensations in your heart, press the record button. The monitor is always on and records what happened slightly before you pressed the button, so do not worry about being too late to get good information.  HOME CARE INSTRUCTIONS   Wear your monitor at all times, except when you are in water:   Do not get the monitor wet.   Take the monitor off when bathing, do not swim or use a hot tub with it on.   Keep your skin clean, do not put body lotion or moisturizer on your chest.   It's possible that your skin under the electrodes could become irritated. To keep this from happening, try to put the electrodes in slightly different places on your chest. However, they must remain in the area under your left breast and in the upper right section of your chest.   Change the electrodes daily or any time they stop sticking to your skin. You might need to use tape to keep them on.   Make sure the monitor is safely clipped to your clothing or in a location close to your body that your caregiver recommends.  Keep a diary of your activities, especially what you were doing when you pushed the button to record your heart symptoms.   Change the batteries as recommended by your caregiver.   Send the recorded information as recommended by your caregiver. It is important to understand that your caregiver does not have instantaneous results when a recording is made.  SEEK MEDICAL CARE IF:   Your doctor reviews your records and advises you to come to the office or go to the ER.   You have questions or concerns about  the cardiac event monitor.  SEEK IMMEDIATE MEDICAL CARE IF:   You have chest pain.   You have difficulty breathing or shortness of breath.   You develop a very fast heartbeat or your heart "skips" beats.   You develop dizziness which lasts several minutes.   You faint or feel you are about to faint.  MAKE SURE YOU:   Understand these instructions.   Will watch your condition.   Will get help right away if you are not doing well or get worse.  Document Released: 12/28/2007 Document Revised: 11/30/2010 Document Reviewed: 12/28/2007 [facility] Patient Information 2012 [place], [place].

## 2011-06-02 ENCOUNTER — Telehealth: Payer: Self-pay | Admitting: Family Medicine

## 2011-06-02 NOTE — Telephone Encounter (Signed)
Last seen 03/22/2010 and letters mailed to scheduled an apt on 05/05/11 and 12/07/10. No response or upcoming apts. Please advise    KP

## 2011-06-02 NOTE — Telephone Encounter (Signed)
No refills

## 2011-06-02 NOTE — Telephone Encounter (Signed)
Refill: Atorvastatin calcium 20mg  tab #30. Take 1 tablet by mouth every day.

## 2011-07-07 ENCOUNTER — Other Ambulatory Visit: Payer: Self-pay | Admitting: *Deleted

## 2011-07-07 DIAGNOSIS — Z113 Encounter for screening for infections with a predominantly sexual mode of transmission: Secondary | ICD-10-CM

## 2011-08-17 ENCOUNTER — Other Ambulatory Visit: Payer: Self-pay | Admitting: Infectious Diseases

## 2011-08-17 DIAGNOSIS — Z113 Encounter for screening for infections with a predominantly sexual mode of transmission: Secondary | ICD-10-CM

## 2011-08-21 ENCOUNTER — Emergency Department (HOSPITAL_BASED_OUTPATIENT_CLINIC_OR_DEPARTMENT_OTHER)
Admission: EM | Admit: 2011-08-21 | Discharge: 2011-08-21 | Disposition: A | Discharge status: Home / Self Care | Payer: Medicaid Other | Attending: Emergency Medicine | Admitting: Emergency Medicine

## 2011-08-21 ENCOUNTER — Encounter (HOSPITAL_BASED_OUTPATIENT_CLINIC_OR_DEPARTMENT_OTHER): Payer: Self-pay | Admitting: Family Medicine

## 2011-08-21 DIAGNOSIS — R112 Nausea with vomiting, unspecified: Secondary | ICD-10-CM | POA: Insufficient documentation

## 2011-08-21 DIAGNOSIS — Z21 Asymptomatic human immunodeficiency virus [HIV] infection status: Secondary | ICD-10-CM | POA: Insufficient documentation

## 2011-08-21 DIAGNOSIS — F17200 Nicotine dependence, unspecified, uncomplicated: Secondary | ICD-10-CM | POA: Insufficient documentation

## 2011-08-21 DIAGNOSIS — N12 Tubulo-interstitial nephritis, not specified as acute or chronic: Secondary | ICD-10-CM

## 2011-08-21 DIAGNOSIS — E785 Hyperlipidemia, unspecified: Secondary | ICD-10-CM | POA: Insufficient documentation

## 2011-08-21 DIAGNOSIS — F319 Bipolar disorder, unspecified: Secondary | ICD-10-CM | POA: Insufficient documentation

## 2011-08-21 HISTORY — DX: Restless legs syndrome: G25.81

## 2011-08-21 HISTORY — DX: Polyneuropathy, unspecified: G62.9

## 2011-08-21 LAB — COMPREHENSIVE METABOLIC PANEL
Albumin: 3.6 g/dL (ref 3.5–5.2)
BUN: 19 mg/dL (ref 6–23)
Chloride: 93 mEq/L — ABNORMAL LOW (ref 96–112)
Creatinine, Ser: 1.5 mg/dL — ABNORMAL HIGH (ref 0.50–1.10)
Total Bilirubin: 1.1 mg/dL (ref 0.3–1.2)

## 2011-08-21 LAB — URINALYSIS, ROUTINE W REFLEX MICROSCOPIC
Glucose, UA: NEGATIVE mg/dL
Nitrite: POSITIVE — AB
Protein, ur: >300 mg/dL — AB
Urobilinogen, UA: 1 mg/dL (ref 0.0–1.0)

## 2011-08-21 LAB — CBC
HCT: 39 % (ref 36.0–46.0)
MCHC: 36.7 g/dL — ABNORMAL HIGH (ref 30.0–36.0)
MCV: 88 fL (ref 78.0–100.0)
RDW: 12.6 % (ref 11.5–15.5)

## 2011-08-21 LAB — URINE MICROSCOPIC-ADD ON

## 2011-08-21 MED ORDER — CEPHALEXIN 500 MG PO CAPS: ORAL_CAPSULE | ORAL | Status: AC

## 2011-08-21 MED ORDER — SODIUM CHLORIDE 0.9 % IV BOLUS (SEPSIS)
1000.0000 mL | Freq: Once | INTRAVENOUS | Status: AC
  Administered 2011-08-21: 1000 mL via INTRAVENOUS

## 2011-08-21 MED ORDER — SODIUM CHLORIDE 0.9 % IV BOLUS (SEPSIS)
2000.0000 mL | Freq: Once | INTRAVENOUS | Status: AC
  Administered 2011-08-21: 1000 mL via INTRAVENOUS

## 2011-08-21 MED ORDER — DEXTROSE 5 % IV SOLN
1.0000 g | Freq: Once | INTRAVENOUS | Status: AC
  Administered 2011-08-21: 1 g via INTRAVENOUS
  Filled 2011-08-21: qty 10

## 2011-08-21 MED ORDER — POTASSIUM CHLORIDE CRYS ER 20 MEQ PO TBCR
40.0000 meq | EXTENDED_RELEASE_TABLET | Freq: Once | ORAL | Status: AC
  Administered 2011-08-21: 40 meq via ORAL
  Filled 2011-08-21: qty 2

## 2011-08-21 MED ORDER — ONDANSETRON 8 MG PO TBDP: ORAL_TABLET | ORAL | Status: AC

## 2011-08-21 MED ORDER — ONDANSETRON HCL 4 MG/2ML IJ SOLN
4.0000 mg | Freq: Once | INTRAMUSCULAR | Status: AC
  Administered 2011-08-21: 4 mg via INTRAVENOUS
  Filled 2011-08-21: qty 2

## 2011-08-21 NOTE — Discharge Instructions (Signed)
Pyelonephritis, Adult Pyelonephritis is a kidney infection. A kidney infection can happen quickly, or it can last for a long time. HOME CARE   Take your medicine (antibiotics) as told. Finish it even if you start to feel better.   Keep all doctor visits as told.   Drink enough fluids to keep your pee (urine) clear or pale yellow.   Only take medicine as told by your doctor.  GET HELP RIGHT AWAY IF:   You have a fever in 2 or more days.  You have uncontrolled vomiting.  You cannot take your medicine or drink fluids as told.   You have chills and shaking.   You feel very weak or pass out (faint).   You do not feel better after 2 days.   You have a rash, shortness of breath, confusion, or other concerns. MAKE SURE YOU:  Understand these instructions.   Will watch your condition.   Will get help right away if you are not doing well or get worse.  Document Released: 04/27/2004 Document Revised: 03/09/2011 Document Reviewed: 09/07/2010 [facility] Patient Information 2012 [place], [place].

## 2011-08-21 NOTE — ED Provider Notes (Signed)
History     CSN: [id number]  Arrival date & time 08/21/11  6962   First MD Initiated Contact with Patient 08/21/11 1120      Chief Complaint  Patient presents with  . Chills  . Emesis    (Consider location/radiation/quality/duration/timing/severity/associated sxs/prior treatment) HPI This 51 year old female is 3 days of intermittent chills and sweats, she also has intermittent nausea and vomiting a few times, she also has some urinary frequency and urgency. She has no confusion rash chest pain shortness breath or cough. She has a baseline generalized body aches which are stable and unchanged. She is no headache or stiff neck. She has no vaginal bleeding or vaginal discharge. She has no confusion. Past Medical History  Diagnosis Date  . HIV infection   . Hypertension   . Hyperlipidemia   . Bipolar affective disorder   . Restless leg syndrome   . Neuropathy     History reviewed. No pertinent past surgical history.  Family History  Problem Relation Age of Onset  . Mitral valve prolapse Mother   . Heart disease Father     History  Substance Use Topics  . Smoking status: Current Everyday Smoker -- 1.0 packs/day for 35 years    Types: Cigarettes  . Smokeless tobacco: Never Used  . Alcohol Use: No    OB History    Grav Para Term Preterm Abortions TAB SAB Ect Mult Living                  Review of Systems  Constitutional: Positive for chills. Negative for fever.       10 Systems reviewed and are negative for acute change except as noted in the HPI.  HENT: Negative for congestion.   Eyes: Negative for discharge and redness.  Respiratory: Negative for cough and shortness of breath.   Cardiovascular: Negative for chest pain.  Gastrointestinal: Positive for nausea and vomiting. Negative for abdominal pain.  Genitourinary: Positive for urgency and frequency. Negative for dysuria, flank pain, vaginal bleeding and vaginal discharge.  Musculoskeletal: Negative for back  pain.  Skin: Negative for rash.  Neurological: Negative for syncope, numbness and headaches.  Psychiatric/Behavioral:       No behavior change.    Allergies  Review of patient's allergies indicates no known allergies.  Home Medications   Current Outpatient Rx  Name Route Sig Dispense Refill  . ALBUTEROL 90 MCG/ACT IN AERS Inhalation Inhale 2 puffs into the lungs 4 (four) times daily as needed. For wheezing    . ASPIRIN 81 MG PO TABS Oral Take 81 mg by mouth daily.    . ATAZANAVIR SULFATE 300 MG PO CAPS Oral Take 1 capsule (300 mg total) by mouth daily with breakfast. 30 capsule 6  . ATENOLOL 100 MG PO TABS Oral Take 100 mg by mouth daily.     . ATORVASTATIN CALCIUM 20 MG PO TABS Oral Take 1 tablet (20 mg total) by mouth daily. 30 tablet 0    OV with fasting labs due now  . CEPHALEXIN 500 MG PO CAPS  2 caps po bid x 7 days 28 capsule 0  . CYANOCOBALAMIN 1000 MCG/ML IJ SOLN Intramuscular Inject 1,000 mcg into the muscle every 30 (thirty) days. Around the 5th of every month    . DIVALPROEX SODIUM ER 500 MG PO TB24 Oral Take 1,500 mg by mouth at bedtime.    Marland Kitchen EMTRICITABINE-TENOFOVIR 200-300 MG PO TABS Oral Take 1 tablet by mouth daily. 30 tablet 6  . LORAZEPAM  0.5 MG PO TABS Oral Take 0.5 mg by mouth 3 (three) times daily. For anxiety     . MIRTAZAPINE 15 MG PO TABS Oral Take 15 mg by mouth at bedtime.    Marland Kitchen MODAFINIL 100 MG PO TABS Oral Take 100 mg by mouth daily.      Marland Kitchen ONDANSETRON 8 MG PO TBDP  8mg  ODT q4 hours prn nausea 4 tablet 0  . PREGABALIN 150 MG PO CAPS Oral Take 300 mg by mouth 2 (two) times daily.     Marland Kitchen RITONAVIR 100 MG PO CAPS Oral Take 1 capsule (100 mg total) by mouth daily. 30 capsule 6  . ROPINIROLE HCL 1 MG PO TABS Oral Take 2 mg by mouth every evening. 1-2 hours before bedtime    . VENLAFAXINE HCL ER 150 MG PO CP24 Oral Take 300 mg by mouth every morning.      BP 114/71  Pulse 76  Temp(Src) 98.1 F (36.7 C) (Oral)  Resp 16  SpO2 100%  LMP 07/10/2011  Physical  Exam  Nursing note and vitals reviewed. Constitutional:       Awake, alert, nontoxic appearance.  HENT:  Head: Atraumatic.  Eyes: Right eye exhibits no discharge. Left eye exhibits no discharge.  Neck: Neck supple.  Cardiovascular: Regular rhythm.   No murmur heard.      Tachycardic  Pulmonary/Chest: Effort normal and breath sounds normal. No respiratory distress. She has no wheezes. She has no rales. She exhibits no tenderness.  Abdominal: Soft. Bowel sounds are normal. She exhibits no distension and no mass. There is no tenderness. There is no rebound and no guarding.  Genitourinary:       No CVA tenderness  Musculoskeletal: She exhibits no edema and no tenderness.       Baseline ROM, no obvious new focal weakness.  Neurological:       Mental status and motor strength appears baseline for patient and situation.  Skin: No rash noted.  Psychiatric: She has a normal mood and affect.    ED Course  Procedures (including critical care time)  Labs Reviewed  CBC - Abnormal; Notable for the following:    WBC 14.5 (*)    MCHC 36.7 (*)    All other components within normal limits  COMPREHENSIVE METABOLIC PANEL - Abnormal; Notable for the following:    Sodium 132 (*)    Potassium 2.9 (*)    Chloride 93 (*)    Glucose, Bld 145 (*)    Creatinine, Ser 1.50 (*)    Alkaline Phosphatase 159 (*)    GFR calc non Af Amer 39 (*)    GFR calc Af Amer 45 (*)    All other components within normal limits  URINALYSIS, ROUTINE W REFLEX MICROSCOPIC - Abnormal; Notable for the following:    Color, Urine ORANGE (*) BIOCHEMICALS MAY BE AFFECTED BY COLOR   APPearance TURBID (*)    Hgb urine dipstick MODERATE (*)    Bilirubin Urine SMALL (*)    Ketones, ur 15 (*)    Protein, ur >300 (*)    Nitrite POSITIVE (*)    Leukocytes, UA MODERATE (*)    All other components within normal limits  URINE MICROSCOPIC-ADD ON - Abnormal; Notable for the following:    Squamous Epithelial / LPF MANY (*)     Bacteria, UA FEW (*)    Casts GRANULAR CAST (*) HYALINE CASTS   All other components within normal limits  URINE CULTURE   No results found.  1. Pyelonephritis       MDM  Pt stable in ED with no significant deterioration in condition.Patient / Family / Caregiver informed of clinical course, understand medical decision-making process, and agree with plan.I doubt any other EMC precluding discharge at this time including, but not necessarily limited to the following:sepsis.        Hurman Horn, MD 08/28/11 [phone]

## 2011-08-21 NOTE — ED Notes (Addendum)
Pt c/o chills since Saturday morning, hot flashes this morning. Pt also c/o nausea and 2 episodes of vomiting. Pt c/o bilateral leg pain and sts "that's normal for me". Pt sts she "just don't feel good". Pt diaphoretic in triage.

## 2011-08-21 NOTE — ED Notes (Signed)
Secondary Assessment- Pt reports onset of generalized "not feeling well" on Saturday, chills, sweats and tachycardia.  No other complaints.  Pt is diaphoretic.

## 2011-08-22 ENCOUNTER — Other Ambulatory Visit: Payer: Self-pay

## 2011-08-22 ENCOUNTER — Encounter: Payer: Self-pay | Admitting: *Deleted

## 2011-08-23 LAB — URINE CULTURE
Colony Count: 25000
Culture  Setup Time: 201305201503

## 2011-08-24 NOTE — ED Notes (Signed)
+   urine Patient treated with Keflex-sensitive to same-chart appended per protocol MD. 

## 2011-08-29 ENCOUNTER — Other Ambulatory Visit (HOSPITAL_COMMUNITY)
Admission: RE | Admit: 2011-08-29 | Discharge: 2011-08-29 | Disposition: A | Discharge status: Home / Self Care | Payer: Medicaid Other | Source: Ambulatory Visit | Attending: Infectious Diseases | Admitting: Infectious Diseases

## 2011-08-29 ENCOUNTER — Other Ambulatory Visit: Payer: Medicaid Other

## 2011-08-29 DIAGNOSIS — Z79899 Other long term (current) drug therapy: Secondary | ICD-10-CM

## 2011-08-29 DIAGNOSIS — Z113 Encounter for screening for infections with a predominantly sexual mode of transmission: Secondary | ICD-10-CM | POA: Insufficient documentation

## 2011-08-29 DIAGNOSIS — B20 Human immunodeficiency virus [HIV] disease: Secondary | ICD-10-CM

## 2011-08-29 LAB — LIPID PANEL
Cholesterol: 198 mg/dL (ref 0–200)
HDL: 25 mg/dL — ABNORMAL LOW (ref >39)
LDL Cholesterol: 142 mg/dL — ABNORMAL HIGH (ref 0–99)
Triglycerides: 156 mg/dL — ABNORMAL HIGH (ref <150)
VLDL: 31 mg/dL (ref 0–40)

## 2011-08-29 LAB — COMPLETE METABOLIC PANEL WITH GFR
ALT: 11 U/L (ref 0–35)
AST: 11 U/L (ref 0–37)
BUN: 22 mg/dL (ref 6–23)
Creat: 0.9 mg/dL (ref 0.50–1.10)
Total Bilirubin: 0.4 mg/dL (ref 0.3–1.2)

## 2011-08-29 LAB — CBC
MCH: 30.3 pg (ref 26.0–34.0)
MCHC: 34.1 g/dL (ref 30.0–36.0)
MCV: 88.9 fL (ref 78.0–100.0)
Platelets: 404 10*3/uL — ABNORMAL HIGH (ref 150–400)
RDW: 13.7 % (ref 11.5–15.5)

## 2011-08-29 LAB — RPR

## 2011-09-05 ENCOUNTER — Ambulatory Visit: Payer: Self-pay | Admitting: Infectious Diseases

## 2011-09-06 ENCOUNTER — Encounter: Payer: Self-pay | Admitting: Infectious Diseases

## 2011-09-06 ENCOUNTER — Ambulatory Visit (INDEPENDENT_AMBULATORY_CARE_PROVIDER_SITE_OTHER): Payer: Self-pay | Admitting: Infectious Diseases

## 2011-09-06 VITALS — BP 137/89 | HR 76 | Temp 98.1°F | Ht 65.0 in | Wt 153.0 lb

## 2011-09-06 DIAGNOSIS — Z87891 Personal history of nicotine dependence: Secondary | ICD-10-CM

## 2011-09-06 DIAGNOSIS — B20 Human immunodeficiency virus [HIV] disease: Secondary | ICD-10-CM

## 2011-09-06 DIAGNOSIS — F319 Bipolar disorder, unspecified: Secondary | ICD-10-CM

## 2011-09-06 NOTE — Assessment & Plan Note (Addendum)
She is doing well. Will cont her current rx. Not sure why her CD4 went up and then back to ? Baseline. She needs to get pap and mammo. vax up to date.  Offered/given condoms. rtc 6 months.

## 2011-09-06 NOTE — Assessment & Plan Note (Addendum)
She is working on changing her mental health f/u. Cont on ativan, remeron, effexor, requip as directed by them.

## 2011-09-06 NOTE — Assessment & Plan Note (Signed)
Encourage not to smoke

## 2011-09-06 NOTE — Progress Notes (Signed)
  Subjective:    Patient ID: Latoya Wilson, female    DOB: 08-Sep-1960, 51 y.o.   MRN: [id number]  HPI 51 yo F with xhx of HIV+, HTN, bipolar d/o. Has gotten primary care MD since last visit. She has been taking the generic of provigil to help her with her narcolepsy. Taking ATVr/TRV.  Doing pretty i guess, having a hard time, considers herself homeless. Living with her boyfriend. Feels like she is sweating all the time- thinks she is going through menopause.  Was in Ed twice since last visit- tachycardic, also visit for pyelo (E coli treated with keflex).  Has quit smoking. Needs to get f/u Gyn, f/u Mammo.  Cont to have difficulty with neuropathy in arms and legs.   HIV 1 RNA Quant (copies/mL)  Date Value  08/29/2011 <20   01/18/2011 <20   07/07/2010 <20      CD4 T Cell Abs (cmm)  Date Value  08/29/2011 310*  01/18/2011 720   07/07/2010 400     Lab Results  Component Value Date   CHOL 198 08/29/2011   HDL 25* 08/29/2011   LDLCALC 142* 08/29/2011   LDLDIRECT 189.7 05/13/2010   TRIG 156* 08/29/2011   CHOLHDL 7.9 08/29/2011      Review of Systems     Objective:   Physical Exam  Constitutional: She appears well-developed and well-nourished.  HENT:  Mouth/Throat: No oropharyngeal exudate.  Eyes: EOM are normal. Pupils are equal, round, and reactive to light.  Neck: Neck supple.  Cardiovascular: Normal rate, regular rhythm and normal heart sounds.   Pulmonary/Chest: Effort normal and breath sounds normal. No respiratory distress.  Abdominal: Soft. Bowel sounds are normal. She exhibits no distension.  Lymphadenopathy:    She has no cervical adenopathy.          Assessment & Plan:

## 2011-09-14 ENCOUNTER — Other Ambulatory Visit (HOSPITAL_COMMUNITY): Payer: Self-pay | Admitting: Obstetrics and Gynecology

## 2011-09-14 DIAGNOSIS — Z1231 Encounter for screening mammogram for malignant neoplasm of breast: Secondary | ICD-10-CM

## 2011-10-04 ENCOUNTER — Encounter: Payer: Self-pay | Admitting: Obstetrics & Gynecology

## 2011-10-04 ENCOUNTER — Other Ambulatory Visit (HOSPITAL_COMMUNITY)
Admission: RE | Admit: 2011-10-04 | Discharge: 2011-10-04 | Disposition: A | Discharge status: Home / Self Care | Payer: Medicaid Other | Source: Ambulatory Visit | Attending: Obstetrics & Gynecology | Admitting: Obstetrics & Gynecology

## 2011-10-04 ENCOUNTER — Ambulatory Visit (INDEPENDENT_AMBULATORY_CARE_PROVIDER_SITE_OTHER): Payer: Medicaid Other | Admitting: Obstetrics & Gynecology

## 2011-10-04 VITALS — BP 141/89 | HR 72 | Temp 97.2°F | Ht 65.0 in | Wt 157.9 lb

## 2011-10-04 DIAGNOSIS — Z124 Encounter for screening for malignant neoplasm of cervix: Secondary | ICD-10-CM

## 2011-10-04 DIAGNOSIS — N879 Dysplasia of cervix uteri, unspecified: Secondary | ICD-10-CM

## 2011-10-04 DIAGNOSIS — R87610 Atypical squamous cells of undetermined significance on cytologic smear of cervix (ASC-US): Secondary | ICD-10-CM

## 2011-10-04 DIAGNOSIS — Z01419 Encounter for gynecological examination (general) (routine) without abnormal findings: Secondary | ICD-10-CM | POA: Insufficient documentation

## 2011-10-04 DIAGNOSIS — Z1159 Encounter for screening for other viral diseases: Secondary | ICD-10-CM | POA: Insufficient documentation

## 2011-10-04 NOTE — Patient Instructions (Signed)
Return to clinic for any scheduled appointments or for any gynecologic concerns as needed.   

## 2011-10-04 NOTE — Progress Notes (Signed)
  Subjective:     Latoya Wilson is a 51 y.o. HIV + woman who comes in today for a  pap smear only. Her most recent annual exam was on 09/06/2011 with her primary doctor; also scheduled for mammogram. Her most recent Pap smear was on 05/2010 and showed ASCUS with POSITIVE high risk HPV. Previous abnormal Pap smears: yes - also CIN I or less.  Has perimenopausal vasomotor symptoms; also skipping menstrual periods. No other GYN concerns.  The following portions of the patient's history were reviewed and updated as appropriate: allergies, current medications, past family history, past medical history, past social history, past surgical history and problem list.  Review of Systems A comprehensive review of systems was negative.   Objective:    BP 141/89  Pulse 72  Temp 97.2 F (36.2 C) (Oral)  Ht 5\' 5"  (1.651 m)  Wt 157 lb 14.4 oz (71.623 kg)  BMI 26.28 kg/m2 Pelvic Exam: cervix normal in appearance, external genitalia normal, no adnexal masses or tenderness, no cervical motion tenderness, uterus normal size, shape, and consistency and vagina normal without discharge. Pap smear obtained.   Assessment:    Screening pap smear History of abnormal pap smears.  Perimenopausal  Plan:    Follow up as indicated by Pap results.

## 2011-10-10 ENCOUNTER — Ambulatory Visit (HOSPITAL_COMMUNITY)
Admission: RE | Admit: 2011-10-10 | Discharge: 2011-10-10 | Disposition: A | Discharge status: Home / Self Care | Payer: Medicaid Other | Source: Ambulatory Visit | Attending: Obstetrics and Gynecology | Admitting: Obstetrics and Gynecology

## 2011-10-10 DIAGNOSIS — Z1231 Encounter for screening mammogram for malignant neoplasm of breast: Secondary | ICD-10-CM

## 2011-11-15 ENCOUNTER — Other Ambulatory Visit: Payer: Self-pay | Admitting: *Deleted

## 2011-11-15 DIAGNOSIS — B20 Human immunodeficiency virus [HIV] disease: Secondary | ICD-10-CM

## 2011-11-15 MED ORDER — RITONAVIR 100 MG PO CAPS: 100.0000 mg | ORAL_CAPSULE | Freq: Every day | ORAL | Status: DC

## 2011-11-15 MED ORDER — ATAZANAVIR SULFATE 300 MG PO CAPS: 300.0000 mg | ORAL_CAPSULE | Freq: Every day | ORAL | Status: DC

## 2011-11-15 MED ORDER — EMTRICITABINE-TENOFOVIR DF 200-300 MG PO TABS: 1.0000 | ORAL_TABLET | Freq: Every day | ORAL | Status: DC

## 2012-01-09 NOTE — Telephone Encounter (Signed)
tkk 

## 2012-03-05 ENCOUNTER — Other Ambulatory Visit: Payer: Medicaid Other

## 2012-03-05 DIAGNOSIS — B20 Human immunodeficiency virus [HIV] disease: Secondary | ICD-10-CM

## 2012-03-05 LAB — CBC
HCT: 40.4 % (ref 36.0–46.0)
MCHC: 35.4 g/dL (ref 30.0–36.0)
MCV: 89.6 fL (ref 78.0–100.0)
RDW: 13.9 % (ref 11.5–15.5)

## 2012-03-06 LAB — COMPREHENSIVE METABOLIC PANEL
AST: 20 U/L (ref 0–37)
Alkaline Phosphatase: 93 U/L (ref 39–117)
BUN: 12 mg/dL (ref 6–23)
Calcium: 9.1 mg/dL (ref 8.4–10.5)
Chloride: 107 mEq/L (ref 96–112)
Creat: 0.8 mg/dL (ref 0.50–1.10)
Glucose, Bld: 97 mg/dL (ref 70–99)

## 2012-03-06 LAB — T-HELPER CELL (CD4) - (RCID CLINIC ONLY): CD4 % Helper T Cell: 33 % (ref 33–55)

## 2012-03-20 ENCOUNTER — Encounter: Payer: Self-pay | Admitting: Infectious Diseases

## 2012-03-20 ENCOUNTER — Ambulatory Visit (INDEPENDENT_AMBULATORY_CARE_PROVIDER_SITE_OTHER): Payer: Medicaid Other | Admitting: Infectious Diseases

## 2012-03-20 VITALS — BP 114/80 | HR 79 | Temp 97.7°F | Ht 65.0 in | Wt 180.5 lb

## 2012-03-20 DIAGNOSIS — Z113 Encounter for screening for infections with a predominantly sexual mode of transmission: Secondary | ICD-10-CM

## 2012-03-20 DIAGNOSIS — N879 Dysplasia of cervix uteri, unspecified: Secondary | ICD-10-CM

## 2012-03-20 DIAGNOSIS — Z79899 Other long term (current) drug therapy: Secondary | ICD-10-CM

## 2012-03-20 DIAGNOSIS — F319 Bipolar disorder, unspecified: Secondary | ICD-10-CM

## 2012-03-20 DIAGNOSIS — B20 Human immunodeficiency virus [HIV] disease: Secondary | ICD-10-CM

## 2012-03-20 DIAGNOSIS — Z87891 Personal history of nicotine dependence: Secondary | ICD-10-CM

## 2012-03-20 DIAGNOSIS — Z23 Encounter for immunization: Secondary | ICD-10-CM

## 2012-03-20 NOTE — Assessment & Plan Note (Signed)
Last PAP normal. She will f/u next year.

## 2012-03-20 NOTE — Progress Notes (Signed)
  Subjective:    Patient ID: Latoya Wilson, female    DOB: [birth year], 51 y.o.   MRN: [id number]  HPI 51 yo F with xhx of HIV+, HTN, bipolar d/o. Taking ATVr/TRV.  Had normal PAP 10-04-11 (previous abn). Has gotten a court date for disability. Is staying with her boyfriend now, all of her things are in storage. Still having a lot of pain. Feels like she is falling down a lot. Legs "just give out", "feels like she has no feeling in them". No dizziness or lightheadedness. States she is supposed to use a cane (she has one here in the clinic).  Is involved in groups every Monday. No problems with ART. Has had irregular periods.  Has new medication for her acne (states it is also good for her potassium). Can't remember name.   HIV 1 RNA Quant (copies/mL)  Date Value  03/05/2012 <20   08/29/2011 <20   01/18/2011 <20      CD4 T Cell Abs (cmm)  Date Value  03/05/2012 800   08/29/2011 310*  01/18/2011 720     Review of Systems  Constitutional: Negative for appetite change and unexpected weight change.  Gastrointestinal: Negative for diarrhea and constipation.  Genitourinary: Positive for menstrual problem. Negative for difficulty urinating.       Objective:   Physical Exam  Constitutional: She appears well-developed and well-nourished.  HENT:  Mouth/Throat: No oropharyngeal exudate.  Eyes: EOM are normal. Pupils are equal, round, and reactive to light.  Neck: Neck supple.  Cardiovascular: Normal rate, regular rhythm and normal heart sounds.   Pulmonary/Chest: Effort normal and breath sounds normal.  Abdominal: Soft. Bowel sounds are normal. There is no tenderness.  Lymphadenopathy:    She has no cervical adenopathy.  Psychiatric: Her mood appears not anxious. Her affect is not angry, not blunt, not labile and not inappropriate. She does not exhibit a depressed mood.       Slightly increased speech.           Assessment & Plan:

## 2012-03-20 NOTE — Assessment & Plan Note (Signed)
States she wants to quit. Encouraged to quit.

## 2012-03-20 NOTE — Addendum Note (Signed)
Addended by: Andree Coss on: 03/20/2012 03:54 PM   Modules accepted: Orders

## 2012-03-20 NOTE — Assessment & Plan Note (Signed)
She has psychiatry f/u next month. States she is not suicidal.

## 2012-03-20 NOTE — Assessment & Plan Note (Signed)
She is doing very well. Will continue her current meds. Give her flu and pneumonia vaccines. Gets condoms today. See her back in 6 months with labs prior.

## 2012-04-04 DIAGNOSIS — G4730 Sleep apnea, unspecified: Secondary | ICD-10-CM | POA: Insufficient documentation

## 2012-06-07 ENCOUNTER — Encounter (HOSPITAL_COMMUNITY): Payer: Self-pay | Admitting: Emergency Medicine

## 2012-06-07 ENCOUNTER — Emergency Department (HOSPITAL_COMMUNITY)
Admission: EM | Admit: 2012-06-07 | Discharge: 2012-06-07 | Disposition: A | Discharge status: Home / Self Care | Payer: Medicaid Other | Attending: Emergency Medicine | Admitting: Emergency Medicine

## 2012-06-07 ENCOUNTER — Emergency Department (HOSPITAL_COMMUNITY): Payer: Medicaid Other

## 2012-06-07 DIAGNOSIS — F319 Bipolar disorder, unspecified: Secondary | ICD-10-CM | POA: Insufficient documentation

## 2012-06-07 DIAGNOSIS — W1809XA Striking against other object with subsequent fall, initial encounter: Secondary | ICD-10-CM | POA: Insufficient documentation

## 2012-06-07 DIAGNOSIS — Y9289 Other specified places as the place of occurrence of the external cause: Secondary | ICD-10-CM | POA: Insufficient documentation

## 2012-06-07 DIAGNOSIS — Z7982 Long term (current) use of aspirin: Secondary | ICD-10-CM | POA: Insufficient documentation

## 2012-06-07 DIAGNOSIS — R42 Dizziness and giddiness: Secondary | ICD-10-CM | POA: Insufficient documentation

## 2012-06-07 DIAGNOSIS — R55 Syncope and collapse: Secondary | ICD-10-CM | POA: Insufficient documentation

## 2012-06-07 DIAGNOSIS — Z8669 Personal history of other diseases of the nervous system and sense organs: Secondary | ICD-10-CM | POA: Insufficient documentation

## 2012-06-07 DIAGNOSIS — Z79899 Other long term (current) drug therapy: Secondary | ICD-10-CM | POA: Insufficient documentation

## 2012-06-07 DIAGNOSIS — Z21 Asymptomatic human immunodeficiency virus [HIV] infection status: Secondary | ICD-10-CM | POA: Insufficient documentation

## 2012-06-07 DIAGNOSIS — E785 Hyperlipidemia, unspecified: Secondary | ICD-10-CM | POA: Insufficient documentation

## 2012-06-07 DIAGNOSIS — I10 Essential (primary) hypertension: Secondary | ICD-10-CM | POA: Insufficient documentation

## 2012-06-07 DIAGNOSIS — Y9301 Activity, walking, marching and hiking: Secondary | ICD-10-CM | POA: Insufficient documentation

## 2012-06-07 DIAGNOSIS — G2581 Restless legs syndrome: Secondary | ICD-10-CM | POA: Insufficient documentation

## 2012-06-07 DIAGNOSIS — S0990XA Unspecified injury of head, initial encounter: Secondary | ICD-10-CM | POA: Insufficient documentation

## 2012-06-07 DIAGNOSIS — G40909 Epilepsy, unspecified, not intractable, without status epilepticus: Secondary | ICD-10-CM | POA: Insufficient documentation

## 2012-06-07 DIAGNOSIS — F17200 Nicotine dependence, unspecified, uncomplicated: Secondary | ICD-10-CM | POA: Insufficient documentation

## 2012-06-07 HISTORY — DX: Narcolepsy without cataplexy: G47.419

## 2012-06-07 HISTORY — DX: Sleep apnea, unspecified: G47.30

## 2012-06-07 HISTORY — DX: Unspecified convulsions: R56.9

## 2012-06-07 LAB — CBC WITH DIFFERENTIAL/PLATELET
Basophils Absolute: 0.1 10*3/uL (ref 0.0–0.1)
Basophils Relative: 1 % (ref 0–1)
HCT: 39.5 % (ref 36.0–46.0)
Lymphocytes Relative: 21 % (ref 12–46)
MCHC: 34.9 g/dL (ref 30.0–36.0)
Neutro Abs: 3.9 10*3/uL (ref 1.7–7.7)
Neutrophils Relative %: 66 % (ref 43–77)
Platelets: 186 10*3/uL (ref 150–400)
RDW: 12.4 % (ref 11.5–15.5)
WBC: 5.9 10*3/uL (ref 4.0–10.5)

## 2012-06-07 LAB — BASIC METABOLIC PANEL
CO2: 27 mEq/L (ref 19–32)
Chloride: 101 mEq/L (ref 96–112)
Creatinine, Ser: 0.9 mg/dL (ref 0.50–1.10)
GFR calc Af Amer: 84 mL/min — ABNORMAL LOW (ref >90)
Potassium: 3.4 mEq/L — ABNORMAL LOW (ref 3.5–5.1)
Sodium: 136 mEq/L (ref 135–145)

## 2012-06-07 MED ORDER — METOCLOPRAMIDE HCL 5 MG/ML IJ SOLN
10.0000 mg | Freq: Once | INTRAMUSCULAR | Status: AC
  Administered 2012-06-07: 10 mg via INTRAVENOUS
  Filled 2012-06-07: qty 2

## 2012-06-07 MED ORDER — SODIUM CHLORIDE 0.9 % IV BOLUS (SEPSIS)
1000.0000 mL | Freq: Once | INTRAVENOUS | Status: AC
  Administered 2012-06-07: 1000 mL via INTRAVENOUS

## 2012-06-07 NOTE — ED Notes (Signed)
Patient transported to CT 

## 2012-06-07 NOTE — ED Provider Notes (Signed)
History     CSN: [id number]  Arrival date & time 06/07/12  1731   First MD Initiated Contact with Patient 06/07/12 1824      Chief Complaint  Patient presents with  . Seizures    Family called EMS for possible seizure-hx of same    (Consider location/radiation/quality/duration/timing/severity/associated sxs/prior treatment) The history is provided by the patient.  Latoya Wilson is a 52 y.o. female history of HIV with normal CD4 and undetectable viral load, seizures but is taking off her meds several months ago, here presenting with syncope versus seizure. She was walking across the room and also felt lightheaded and felt like she couldn't pass out and then she passed out. She fell and hit the back of her head on the door handle. She said she had some shakes afterwards and hurt her son calling her name. No tongue biting and no incontinence. Has good PO intake and no fevers or chills. Was on Dilantin previously but is taken off by her doctor a year ago due to elevate LFTs.   Past Medical History  Diagnosis Date  . HIV infection   . Hypertension   . Hyperlipidemia   . Bipolar affective disorder   . Restless leg syndrome   . Neuropathy   . Seizures   . Sleep apnea   . Narcolepsy     History reviewed. No pertinent past surgical history.  Family History  Problem Relation Age of Onset  . Mitral valve prolapse Mother   . Heart disease Father     History  Substance Use Topics  . Smoking status: Current Every Day Smoker -- 1.00 packs/day for 35 years    Types: Cigarettes    Last Attempt to Quit: 08/21/2011  . Smokeless tobacco: Never Used  . Alcohol Use: No    OB History   Grav Para Term Preterm Abortions TAB SAB Ect Mult Living                  Review of Systems  Neurological: Positive for dizziness, seizures and syncope.  All other systems reviewed and are negative.    Allergies  Hydrocodone and Oxycodone  Home Medications   Current Outpatient Rx  Name   Route  Sig  Dispense  Refill  . albuterol (PROVENTIL HFA;VENTOLIN HFA) 108 (90 BASE) MCG/ACT inhaler   Inhalation   Inhale 2 puffs into the lungs every 6 (six) hours as needed for wheezing. Wheezing         . aspirin 81 MG tablet   Oral   Take 81 mg by mouth daily.         Marland Kitchen atazanavir (REYATAZ) 300 MG capsule   Oral   Take 1 capsule (300 mg total) by mouth daily with breakfast.   30 capsule   6   . atenolol (TENORMIN) 100 MG tablet   Oral   Take 100 mg by mouth daily.          Marland Kitchen atorvastatin (LIPITOR) 20 MG tablet   Oral   Take 20 mg by mouth daily.         . cyanocobalamin (,VITAMIN B-12,) 1000 MCG/ML injection   Intramuscular   Inject 1,000 mcg into the muscle every 30 (thirty) days. Around the 5th of every month         . emtricitabine-tenofovir (TRUVADA) 200-300 MG per tablet   Oral   Take 1 tablet by mouth daily.   30 tablet   6   . LORazepam (ATIVAN)  0.5 MG tablet   Oral   Take 0.5 mg by mouth 3 (three) times daily. For anxiety          . mirtazapine (REMERON) 15 MG tablet   Oral   Take 15 mg by mouth at bedtime.         . modafinil (PROVIGIL) 100 MG tablet   Oral   Take 100 mg by mouth daily.          . pregabalin (LYRICA) 150 MG capsule   Oral   Take 300 mg by mouth 2 (two) times daily.          . ritonavir (NORVIR) 100 MG capsule   Oral   Take 1 capsule (100 mg total) by mouth daily.   30 capsule   6   . rOPINIRole (REQUIP) 1 MG tablet   Oral   Take 2 mg by mouth every evening. 1-2 hours before bedtime         . tretinoin (RETIN-A) 0.025 % cream   Topical   Apply topically at bedtime.         Marland Kitchen venlafaxine (EFFEXOR-XR) 150 MG 24 hr capsule   Oral   Take 300 mg by mouth every morning.         Marland Kitchen EXPIRED: albuterol (PROVENTIL,VENTOLIN) 90 MCG/ACT inhaler   Inhalation   Inhale 2 puffs into the lungs 4 (four) times daily as needed. For wheezing         . EXPIRED: atorvastatin (LIPITOR) 20 MG tablet   Oral   Take  1 tablet (20 mg total) by mouth daily.   30 tablet   0     OV with fasting labs due now     BP 104/70  Pulse 65  Resp 18  SpO2 100%  LMP 06/07/2012  Physical Exam  Nursing note and vitals reviewed. Constitutional: She is oriented to person, place, and time. She appears well-developed and well-nourished.  HENT:  Head: Normocephalic.  Mouth/Throat: Oropharynx is clear and moist.  No tongue laceration   Eyes: Conjunctivae are normal. Pupils are equal, round, and reactive to light.  Neck: Normal range of motion. Neck supple.  Cardiovascular: Normal rate, regular rhythm and normal heart sounds.   Pulmonary/Chest: Effort normal and breath sounds normal. No respiratory distress. She has no wheezes. She has no rales.  Abdominal: Soft. Bowel sounds are normal. She exhibits no distension. There is no tenderness. There is no rebound and no guarding.  Musculoskeletal: Normal range of motion. She exhibits no edema.  Neurological: She is alert and oriented to person, place, and time. No cranial nerve deficit. Coordination normal.  Skin: Skin is warm and dry.  Psychiatric: She has a normal mood and affect. Her behavior is normal. Judgment and thought content normal.    ED Course  Procedures (including critical care time)  Labs Reviewed  BASIC METABOLIC PANEL - Abnormal; Notable for the following:    Potassium 3.4 (*)    GFR calc non Af Amer 72 (*)    GFR calc Af Amer 84 (*)    All other components within normal limits  CBC WITH DIFFERENTIAL   Ct Head Wo Contrast  06/07/2012  *RADIOLOGY REPORT*  Clinical Data: Seizure.  History of seizures.  Weakness.  CT HEAD WITHOUT CONTRAST  Technique:  Contiguous axial images were obtained from the base of the skull through the vertex without contrast.  Comparison: 05/18/2011  Findings: Bone windows demonstrate clear paranasal sinuses and mastoid air cells.  Soft  tissue windows demonstrate no  mass lesion, hemorrhage, hydrocephalus, acute infarct,  intra-axial, or extra-axial fluid collection.  IMPRESSION: Normal head CT.   Original Report Authenticated By: Jeronimo Greaves, M.D.      No diagnosis found.   Date: 06/07/2012  Rate: 64  Rhythm: sinus rhythm with 1st degree AV block  QRS Axis: normal  Intervals: PR prolonged  ST/T Wave abnormalities: normal  Conduction Disutrbances:none  Narrative Interpretation:   Old EKG Reviewed: unchanged    MDM  TABITHA RIGGINS is a 52 y.o. female here with seizure vs syncope. I think her symptoms are more consistent with syncope than seizure. Will check basic labs, CT head. Will reassess after fluids.   8:51 PM Patient not orthostatic. CT head nl. Felt better after fluids. Labs unremarkable. Unclear if its syncope vs seizure. Won't start new antiepileptic right now. She has neurology f/u. Will have her f/u with her neurologist. No driving until then.        Richardean Canal, MD 06/07/12 2053

## 2012-06-07 NOTE — ED Notes (Addendum)
Pt states last seizure was over a year ago.  Pt denies biting tongue or incontinence.  Pt states she lost consciousness during seizure.  Pt states that she does not take any medicine for seizures.  Is unsure if she is supposed to be on any.  Pt alert and oriented at present.  Pt states she feels weak. Pt states she hit her head during seizure and complains of headache.

## 2012-06-07 NOTE — ED Notes (Signed)
Per EMS-pt alert and appropriate, family reported seizure x1 - approx. 15 seconds. Pt fell and stuck door with back of head. No raised area or laceration reported

## 2012-06-10 ENCOUNTER — Other Ambulatory Visit: Payer: Self-pay | Admitting: *Deleted

## 2012-06-10 DIAGNOSIS — B20 Human immunodeficiency virus [HIV] disease: Secondary | ICD-10-CM

## 2012-06-10 MED ORDER — EMTRICITABINE-TENOFOVIR DF 200-300 MG PO TABS: 1.0000 | ORAL_TABLET | Freq: Every day | ORAL | Status: DC

## 2012-06-10 MED ORDER — RITONAVIR 100 MG PO CAPS: 100.0000 mg | ORAL_CAPSULE | Freq: Every day | ORAL | Status: DC

## 2012-06-10 MED ORDER — ATAZANAVIR SULFATE 300 MG PO CAPS: 300.0000 mg | ORAL_CAPSULE | Freq: Every day | ORAL | Status: DC

## 2012-07-10 DIAGNOSIS — M797 Fibromyalgia: Secondary | ICD-10-CM | POA: Insufficient documentation

## 2012-09-04 ENCOUNTER — Other Ambulatory Visit: Payer: Self-pay | Admitting: Infectious Disease

## 2012-09-04 ENCOUNTER — Other Ambulatory Visit: Payer: Medicare Other

## 2012-09-04 DIAGNOSIS — B20 Human immunodeficiency virus [HIV] disease: Secondary | ICD-10-CM

## 2012-09-04 DIAGNOSIS — Z79899 Other long term (current) drug therapy: Secondary | ICD-10-CM

## 2012-09-04 DIAGNOSIS — Z113 Encounter for screening for infections with a predominantly sexual mode of transmission: Secondary | ICD-10-CM

## 2012-09-04 LAB — COMPREHENSIVE METABOLIC PANEL
ALT: 19 U/L (ref 0–35)
Albumin: 4.8 g/dL (ref 3.5–5.2)
CO2: 25 mEq/L (ref 19–32)
Potassium: 5.1 mEq/L (ref 3.5–5.3)
Sodium: 132 mEq/L — ABNORMAL LOW (ref 135–145)
Total Bilirubin: 2.1 mg/dL — ABNORMAL HIGH (ref 0.3–1.2)
Total Protein: 7.3 g/dL (ref 6.0–8.3)

## 2012-09-04 LAB — CBC
Hemoglobin: 16.8 g/dL — ABNORMAL HIGH (ref 12.0–15.0)
MCH: 33 pg (ref 26.0–34.0)
Platelets: 308 10*3/uL (ref 150–400)
RBC: 5.09 MIL/uL (ref 3.87–5.11)
WBC: 17.4 10*3/uL — ABNORMAL HIGH (ref 4.0–10.5)

## 2012-09-04 LAB — LIPID PANEL
Cholesterol: 195 mg/dL (ref 0–200)
LDL Cholesterol: 109 mg/dL — ABNORMAL HIGH (ref 0–99)
VLDL: 38 mg/dL (ref 0–40)

## 2012-09-05 LAB — T-HELPER CELL (CD4) - (RCID CLINIC ONLY)
CD4 % Helper T Cell: 22 % — ABNORMAL LOW (ref 33–55)
CD4 T Cell Abs: 340 uL — ABNORMAL LOW (ref 400–2700)

## 2012-09-05 LAB — HIV-1 RNA QUANT-NO REFLEX-BLD
HIV 1 RNA Quant: <20 copies/mL (ref <20)
HIV-1 RNA Quant, Log: <1.3 {Log} (ref <1.30)

## 2012-09-18 ENCOUNTER — Encounter: Payer: Self-pay | Admitting: Internal Medicine

## 2012-09-18 ENCOUNTER — Ambulatory Visit (INDEPENDENT_AMBULATORY_CARE_PROVIDER_SITE_OTHER): Payer: Medicare Other | Admitting: Internal Medicine

## 2012-09-18 VITALS — BP 166/92 | HR 71 | Temp 98.2°F | Ht 65.0 in | Wt 161.0 lb

## 2012-09-18 DIAGNOSIS — Z87891 Personal history of nicotine dependence: Secondary | ICD-10-CM

## 2012-09-18 DIAGNOSIS — B20 Human immunodeficiency virus [HIV] disease: Secondary | ICD-10-CM

## 2012-09-18 NOTE — Assessment & Plan Note (Addendum)
She is doing well with her HIV and will continue with same regimen. Will return in 6 months for routine followup.  Her WBC is elevated but likely since she was on steroids at the time.

## 2012-09-18 NOTE — Assessment & Plan Note (Signed)
She continues to struggle with smoking cessation. She is motivated to quit and is to try the patch and consider the [place] quit line.

## 2012-09-18 NOTE — Progress Notes (Signed)
  Subjective:    Patient ID: Latoya Wilson, female    DOB: 02/10/1961, 52 y.o.   MRN: [id number]  HPI  she comes in for followup of HIV. She is on Reyataz, Norvir and Truvada. Her CD4 count is 340 with an undetectable viral load.  She has been undetectable since 2011 and continues to take her medicine with no missed doses. She does continue to smoke but is trying to quit. She has tried Chantix and the past but does make her crazier.  She is seeing a cardiologist for evaluation of a cardiac arrhythmia. She is still interested in quitting smoking and she does have information on the [place] quit line. She is now in a stable living environment.  At the time of her labs, she was on prednisone by her primary physician.   Review of Systems  Constitutional: Negative for fever, fatigue and unexpected weight change.  HENT: Negative for sore throat and trouble swallowing.   Respiratory: Positive for wheezing. Negative for cough and shortness of breath.   Cardiovascular: Negative for chest pain, palpitations and leg swelling.  Gastrointestinal: Negative for nausea and diarrhea.  Musculoskeletal: Negative for myalgias and arthralgias.  Skin: Negative for rash.  Neurological: Negative for dizziness and headaches.  Hematological: Negative for adenopathy.       Objective:   Physical Exam  Constitutional: She appears well-developed and well-nourished. No distress.  HENT:  Mouth/Throat: No oropharyngeal exudate.  Eyes: Right eye exhibits no discharge. Left eye exhibits no discharge. No scleral icterus.  Lymphadenopathy:    She has no cervical adenopathy.          Assessment & Plan:

## 2012-09-30 ENCOUNTER — Telehealth: Payer: Self-pay | Admitting: *Deleted

## 2012-09-30 NOTE — Telephone Encounter (Signed)
Pt agreed to call [facility] for GYN/PAP smear appt.

## 2013-01-20 ENCOUNTER — Other Ambulatory Visit: Payer: Self-pay | Admitting: *Deleted

## 2013-01-20 DIAGNOSIS — B20 Human immunodeficiency virus [HIV] disease: Secondary | ICD-10-CM

## 2013-01-20 MED ORDER — ATAZANAVIR SULFATE 300 MG PO CAPS: 300.0000 mg | ORAL_CAPSULE | Freq: Every day | ORAL | Status: DC

## 2013-01-20 MED ORDER — RITONAVIR 100 MG PO CAPS: 100.0000 mg | ORAL_CAPSULE | Freq: Every day | ORAL | Status: DC

## 2013-01-20 MED ORDER — EMTRICITABINE-TENOFOVIR DF 200-300 MG PO TABS: 1.0000 | ORAL_TABLET | Freq: Every day | ORAL | Status: DC

## 2013-01-20 NOTE — Telephone Encounter (Signed)
Requested pt call [facility] for lab and MD appts.  

## 2013-02-18 ENCOUNTER — Telehealth: Payer: Self-pay | Admitting: *Deleted

## 2013-02-18 NOTE — Telephone Encounter (Signed)
LM requesting pt call for PAP smear appt.

## 2013-03-03 ENCOUNTER — Other Ambulatory Visit: Payer: Medicare Other

## 2013-03-03 DIAGNOSIS — B20 Human immunodeficiency virus [HIV] disease: Secondary | ICD-10-CM

## 2013-03-03 LAB — CBC WITH DIFFERENTIAL/PLATELET
Basophils Absolute: 0.1 10*3/uL (ref 0.0–0.1)
HCT: 41.7 % (ref 36.0–46.0)
Hemoglobin: 14.6 g/dL (ref 12.0–15.0)
Lymphocytes Relative: 18 % (ref 12–46)
Lymphs Abs: 1.8 10*3/uL (ref 0.7–4.0)
Monocytes Absolute: 0.7 10*3/uL (ref 0.1–1.0)
Monocytes Relative: 7 % (ref 3–12)
Neutro Abs: 7.1 10*3/uL (ref 1.7–7.7)
RBC: 4.62 MIL/uL (ref 3.87–5.11)
WBC: 9.8 10*3/uL (ref 4.0–10.5)

## 2013-03-03 LAB — COMPLETE METABOLIC PANEL WITH GFR
Alkaline Phosphatase: 128 U/L — ABNORMAL HIGH (ref 39–117)
BUN: 16 mg/dL (ref 6–23)
GFR, Est Non African American: 82 mL/min
Glucose, Bld: 85 mg/dL (ref 70–99)
Total Bilirubin: 0.8 mg/dL (ref 0.3–1.2)

## 2013-03-04 LAB — HIV-1 RNA QUANT-NO REFLEX-BLD: HIV 1 RNA Quant: <20 copies/mL (ref <20)

## 2013-03-04 LAB — T-HELPER CELL (CD4) - (RCID CLINIC ONLY): CD4 % Helper T Cell: 32 % — ABNORMAL LOW (ref 33–55)

## 2013-03-19 ENCOUNTER — Encounter: Payer: Self-pay | Admitting: Infectious Diseases

## 2013-03-19 ENCOUNTER — Other Ambulatory Visit: Payer: Self-pay | Admitting: Infectious Diseases

## 2013-03-19 ENCOUNTER — Ambulatory Visit (INDEPENDENT_AMBULATORY_CARE_PROVIDER_SITE_OTHER): Payer: Medicare Other | Admitting: Infectious Diseases

## 2013-03-19 VITALS — BP 123/82 | HR 117 | Temp 97.6°F | Ht 65.0 in | Wt 171.0 lb

## 2013-03-19 DIAGNOSIS — I10 Essential (primary) hypertension: Secondary | ICD-10-CM

## 2013-03-19 DIAGNOSIS — Z1231 Encounter for screening mammogram for malignant neoplasm of breast: Secondary | ICD-10-CM

## 2013-03-19 DIAGNOSIS — Z87891 Personal history of nicotine dependence: Secondary | ICD-10-CM

## 2013-03-19 DIAGNOSIS — B20 Human immunodeficiency virus [HIV] disease: Secondary | ICD-10-CM

## 2013-03-19 DIAGNOSIS — Z23 Encounter for immunization: Secondary | ICD-10-CM

## 2013-03-19 DIAGNOSIS — Z113 Encounter for screening for infections with a predominantly sexual mode of transmission: Secondary | ICD-10-CM

## 2013-03-19 DIAGNOSIS — F319 Bipolar disorder, unspecified: Secondary | ICD-10-CM

## 2013-03-19 DIAGNOSIS — F411 Generalized anxiety disorder: Secondary | ICD-10-CM

## 2013-03-19 DIAGNOSIS — Z79899 Other long term (current) drug therapy: Secondary | ICD-10-CM

## 2013-03-19 NOTE — Assessment & Plan Note (Signed)
Has f/u with her mental health provider. Tried to help her with cognitive reframing of her anxiety.

## 2013-03-19 NOTE — Progress Notes (Signed)
   Subjective:    Patient ID: Latoya Wilson, female    DOB: 19-Jan-1961, 52 y.o.   MRN: [id number]  HPI 53 yo F with xhx of HIV+, HTN, bipolar d/o. Taking ATVr/TRV. Had normal PAP 10-04-11 (previous abn).  Can't quit smoking due to anxiety attacks. Still seeing mental health MD. Has f/u appt in Jan- wants to go back onto bzd.  Has "implanted loop recorder", no further episodes.  Knows she needs mammo, pap appt.   HIV 1 RNA Quant (copies/mL)  Date Value  03/03/2013 <20   09/04/2012 <20   03/05/2012 <20      CD4 T Cell Abs (/uL)  Date Value  03/03/2013 630   09/04/2012 340*  03/05/2012 800     Review of Systems  Constitutional: Negative for appetite change and unexpected weight change.  Respiratory: Positive for cough. Negative for shortness of breath.   Cardiovascular: Negative for palpitations.  Gastrointestinal: Negative for diarrhea and constipation.  Genitourinary: Positive for menstrual problem. Negative for difficulty urinating.       Objective:   Physical Exam  Constitutional: She appears well-developed and well-nourished.  HENT:  Mouth/Throat: No oropharyngeal exudate.  Eyes: EOM are normal. Pupils are equal, round, and reactive to light.  Neck: Neck supple.  Cardiovascular: Normal rate, regular rhythm and normal heart sounds.   Pulmonary/Chest: Effort normal and breath sounds normal.  Abdominal: Soft. Bowel sounds are normal. She exhibits no distension.  Lymphadenopathy:    She has no cervical adenopathy.  Psychiatric: She has a normal mood and affect. Her speech is rapid and/or pressured.   L shoulder in sling. Had recent arthroscopy.      Assessment & Plan:

## 2013-03-19 NOTE — Assessment & Plan Note (Signed)
Well controlled, HR up from anxiety.

## 2013-03-19 NOTE — Assessment & Plan Note (Addendum)
She is doing very well. Gets flu shot today. Given condoms. No change in her meds. Possible switch to FDC in future? Check Hep B SAb at f/u. Will get her set up for PAP mammo.  rtc 6 months.

## 2013-03-19 NOTE — Assessment & Plan Note (Signed)
Still having anxiety, panic attacks. Has f/u with her mental health provider.

## 2013-03-19 NOTE — Assessment & Plan Note (Signed)
She is afraid to use patches due to hx of htn and palpitations. Encouraged her to use gum. Very difficult in pt's with mental health problems.

## 2013-03-24 ENCOUNTER — Other Ambulatory Visit (HOSPITAL_COMMUNITY)
Admission: RE | Admit: 2013-03-24 | Discharge: 2013-03-24 | Disposition: A | Discharge status: Home / Self Care | Payer: Medicare Other | Source: Ambulatory Visit | Attending: Infectious Diseases | Admitting: Infectious Diseases

## 2013-03-24 ENCOUNTER — Ambulatory Visit (INDEPENDENT_AMBULATORY_CARE_PROVIDER_SITE_OTHER): Payer: Medicare Other | Admitting: *Deleted

## 2013-03-24 DIAGNOSIS — Z124 Encounter for screening for malignant neoplasm of cervix: Secondary | ICD-10-CM

## 2013-03-24 NOTE — Patient Instructions (Signed)
Your results will be ready in about a week.  I will mail them to you.  Thank you for coming to the Center for your care.  Dailon Sheeran,  RN 

## 2013-03-24 NOTE — Progress Notes (Signed)
  Subjective:     Latoya Wilson is a 52 y.o. woman who comes in today for a  pap smear only.  Previous abnormal Pap smears: yes. Contraception:  Condoms.  Irregular menses.  Objective:    There were no vitals taken for this visit. Pelvic Exam: Pap smear obtained.   Assessment:    Screening pap smear.   Plan:  Pt given educational materials re: HIV and women, self-esteem, BSE, nutrition and diet management, PAP smears and partner safety. Pt given condoms.   Follow up in one year, or as indicated by Pap results.

## 2013-03-31 ENCOUNTER — Encounter: Payer: Self-pay | Admitting: *Deleted

## 2013-04-02 ENCOUNTER — Ambulatory Visit
Admission: RE | Admit: 2013-04-02 | Discharge: 2013-04-02 | Disposition: A | Discharge status: Home / Self Care | Payer: Medicare Other | Source: Ambulatory Visit | Attending: Infectious Diseases | Admitting: Infectious Diseases

## 2013-04-02 DIAGNOSIS — Z1231 Encounter for screening mammogram for malignant neoplasm of breast: Secondary | ICD-10-CM

## 2013-04-04 DIAGNOSIS — M25519 Pain in unspecified shoulder: Secondary | ICD-10-CM | POA: Diagnosis not present

## 2013-04-09 DIAGNOSIS — Z4509 Encounter for adjustment and management of other cardiac device: Secondary | ICD-10-CM | POA: Diagnosis not present

## 2013-04-09 DIAGNOSIS — R55 Syncope and collapse: Secondary | ICD-10-CM | POA: Diagnosis not present

## 2013-04-10 ENCOUNTER — Other Ambulatory Visit: Payer: Self-pay | Admitting: Infectious Diseases

## 2013-04-10 DIAGNOSIS — R928 Other abnormal and inconclusive findings on diagnostic imaging of breast: Secondary | ICD-10-CM

## 2013-04-11 DIAGNOSIS — M25519 Pain in unspecified shoulder: Secondary | ICD-10-CM | POA: Diagnosis not present

## 2013-04-17 ENCOUNTER — Other Ambulatory Visit: Payer: Self-pay | Admitting: Infectious Diseases

## 2013-04-17 ENCOUNTER — Other Ambulatory Visit: Payer: Self-pay

## 2013-04-17 DIAGNOSIS — R928 Other abnormal and inconclusive findings on diagnostic imaging of breast: Secondary | ICD-10-CM

## 2013-04-21 DIAGNOSIS — F411 Generalized anxiety disorder: Secondary | ICD-10-CM | POA: Diagnosis not present

## 2013-04-23 ENCOUNTER — Ambulatory Visit
Admission: RE | Admit: 2013-04-23 | Discharge: 2013-04-23 | Disposition: A | Discharge status: Home / Self Care | Payer: Medicare Other | Source: Ambulatory Visit | Attending: Infectious Diseases | Admitting: Infectious Diseases

## 2013-04-23 DIAGNOSIS — R928 Other abnormal and inconclusive findings on diagnostic imaging of breast: Secondary | ICD-10-CM

## 2013-04-25 DIAGNOSIS — M25519 Pain in unspecified shoulder: Secondary | ICD-10-CM | POA: Diagnosis not present

## 2013-04-25 DIAGNOSIS — F411 Generalized anxiety disorder: Secondary | ICD-10-CM | POA: Diagnosis not present

## 2013-05-09 DIAGNOSIS — Z7982 Long term (current) use of aspirin: Secondary | ICD-10-CM | POA: Diagnosis not present

## 2013-05-09 DIAGNOSIS — R05 Cough: Secondary | ICD-10-CM | POA: Diagnosis not present

## 2013-05-09 DIAGNOSIS — K219 Gastro-esophageal reflux disease without esophagitis: Secondary | ICD-10-CM | POA: Diagnosis not present

## 2013-05-09 DIAGNOSIS — J45909 Unspecified asthma, uncomplicated: Secondary | ICD-10-CM | POA: Diagnosis not present

## 2013-05-09 DIAGNOSIS — R059 Cough, unspecified: Secondary | ICD-10-CM | POA: Diagnosis not present

## 2013-05-09 DIAGNOSIS — F17200 Nicotine dependence, unspecified, uncomplicated: Secondary | ICD-10-CM | POA: Diagnosis not present

## 2013-05-09 DIAGNOSIS — M129 Arthropathy, unspecified: Secondary | ICD-10-CM | POA: Diagnosis not present

## 2013-05-09 DIAGNOSIS — Z885 Allergy status to narcotic agent status: Secondary | ICD-10-CM | POA: Diagnosis not present

## 2013-05-09 DIAGNOSIS — Z79899 Other long term (current) drug therapy: Secondary | ICD-10-CM | POA: Diagnosis not present

## 2013-05-09 DIAGNOSIS — I10 Essential (primary) hypertension: Secondary | ICD-10-CM | POA: Diagnosis not present

## 2013-06-04 DIAGNOSIS — F411 Generalized anxiety disorder: Secondary | ICD-10-CM | POA: Diagnosis not present

## 2013-06-11 DIAGNOSIS — M25519 Pain in unspecified shoulder: Secondary | ICD-10-CM | POA: Diagnosis not present

## 2013-08-06 DIAGNOSIS — G589 Mononeuropathy, unspecified: Secondary | ICD-10-CM | POA: Diagnosis not present

## 2013-08-06 DIAGNOSIS — R110 Nausea: Secondary | ICD-10-CM | POA: Diagnosis not present

## 2013-08-12 DIAGNOSIS — R52 Pain, unspecified: Secondary | ICD-10-CM | POA: Diagnosis not present

## 2013-08-12 DIAGNOSIS — G47419 Narcolepsy without cataplexy: Secondary | ICD-10-CM | POA: Diagnosis not present

## 2013-08-12 DIAGNOSIS — G8929 Other chronic pain: Secondary | ICD-10-CM | POA: Diagnosis not present

## 2013-08-12 DIAGNOSIS — G589 Mononeuropathy, unspecified: Secondary | ICD-10-CM | POA: Diagnosis not present

## 2013-08-12 DIAGNOSIS — G609 Hereditary and idiopathic neuropathy, unspecified: Secondary | ICD-10-CM | POA: Diagnosis not present

## 2013-08-13 DIAGNOSIS — M25519 Pain in unspecified shoulder: Secondary | ICD-10-CM | POA: Diagnosis not present

## 2013-09-10 DIAGNOSIS — M25519 Pain in unspecified shoulder: Secondary | ICD-10-CM | POA: Diagnosis not present

## 2013-09-11 DIAGNOSIS — F411 Generalized anxiety disorder: Secondary | ICD-10-CM | POA: Diagnosis not present

## 2013-09-16 ENCOUNTER — Other Ambulatory Visit (HOSPITAL_COMMUNITY)
Admission: RE | Admit: 2013-09-16 | Discharge: 2013-09-16 | Disposition: A | Discharge status: Home / Self Care | Payer: Medicare Other | Source: Ambulatory Visit | Attending: Infectious Diseases | Admitting: Infectious Diseases

## 2013-09-16 ENCOUNTER — Other Ambulatory Visit: Payer: Medicare Other

## 2013-09-16 ENCOUNTER — Other Ambulatory Visit: Payer: Self-pay | Admitting: *Deleted

## 2013-09-16 DIAGNOSIS — Z113 Encounter for screening for infections with a predominantly sexual mode of transmission: Secondary | ICD-10-CM | POA: Insufficient documentation

## 2013-09-16 DIAGNOSIS — Z79899 Other long term (current) drug therapy: Secondary | ICD-10-CM | POA: Diagnosis not present

## 2013-09-16 DIAGNOSIS — B20 Human immunodeficiency virus [HIV] disease: Secondary | ICD-10-CM

## 2013-09-16 LAB — CBC WITH DIFFERENTIAL/PLATELET
Basophils Absolute: 0.1 10*3/uL (ref 0.0–0.1)
Basophils Relative: 1 % (ref 0–1)
Eosinophils Absolute: 0.2 10*3/uL (ref 0.0–0.7)
Eosinophils Relative: 3 % (ref 0–5)
HEMATOCRIT: 41.5 % (ref 36.0–46.0)
Hemoglobin: 14.5 g/dL (ref 12.0–15.0)
LYMPHS ABS: 1.5 10*3/uL (ref 0.7–4.0)
Lymphocytes Relative: 26 % (ref 12–46)
MCH: 31.5 pg (ref 26.0–34.0)
MCHC: 34.9 g/dL (ref 30.0–36.0)
MCV: 90.2 fL (ref 78.0–100.0)
MONO ABS: 0.4 10*3/uL (ref 0.1–1.0)
MONOS PCT: 7 % (ref 3–12)
NEUTROS ABS: 3.7 10*3/uL (ref 1.7–7.7)
NEUTROS PCT: 63 % (ref 43–77)
Platelets: 222 10*3/uL (ref 150–400)
RBC: 4.6 MIL/uL (ref 3.87–5.11)
RDW: 13.4 % (ref 11.5–15.5)
WBC: 5.8 10*3/uL (ref 4.0–10.5)

## 2013-09-16 LAB — COMPLETE METABOLIC PANEL WITH GFR
ALBUMIN: 4.4 g/dL (ref 3.5–5.2)
ALT: 17 U/L (ref 0–35)
AST: 23 U/L (ref 0–37)
Alkaline Phosphatase: 141 U/L — ABNORMAL HIGH (ref 39–117)
BUN: 16 mg/dL (ref 6–23)
CALCIUM: 9.9 mg/dL (ref 8.4–10.5)
CO2: 23 mEq/L (ref 19–32)
Chloride: 104 mEq/L (ref 96–112)
Creat: 1.03 mg/dL (ref 0.50–1.10)
GFR, EST NON AFRICAN AMERICAN: 62 mL/min
GFR, Est African American: 72 mL/min
GLUCOSE: 112 mg/dL — AB (ref 70–99)
Potassium: 4.3 mEq/L (ref 3.5–5.3)
Sodium: 138 mEq/L (ref 135–145)
TOTAL PROTEIN: 6.8 g/dL (ref 6.0–8.3)
Total Bilirubin: 0.4 mg/dL (ref 0.2–1.2)

## 2013-09-16 LAB — LIPID PANEL
Cholesterol: 203 mg/dL — ABNORMAL HIGH (ref 0–200)
HDL: 45 mg/dL (ref >39)
LDL CALC: 136 mg/dL — AB (ref 0–99)
Total CHOL/HDL Ratio: 4.5 Ratio
Triglycerides: 112 mg/dL (ref <150)
VLDL: 22 mg/dL (ref 0–40)

## 2013-09-16 LAB — HEPATITIS B SURFACE ANTIBODY,QUALITATIVE: Hep B S Ab: NEGATIVE

## 2013-09-16 LAB — RPR

## 2013-09-16 MED ORDER — ATAZANAVIR SULFATE 300 MG PO CAPS: 300.0000 mg | ORAL_CAPSULE | Freq: Every day | ORAL | Status: DC

## 2013-09-16 MED ORDER — EMTRICITABINE-TENOFOVIR DF 200-300 MG PO TABS: 1.0000 | ORAL_TABLET | Freq: Every day | ORAL | Status: DC

## 2013-09-16 MED ORDER — RITONAVIR 100 MG PO CAPS: 100.0000 mg | ORAL_CAPSULE | Freq: Every day | ORAL | Status: DC

## 2013-09-17 LAB — HIV-1 RNA QUANT-NO REFLEX-BLD: HIV-1 RNA Quant, Log: <1.3 {Log} (ref <1.30)

## 2013-09-17 LAB — T-HELPER CELL (CD4) - (RCID CLINIC ONLY)
CD4 T CELL ABS: 540 /uL (ref 400–2700)
CD4 T CELL HELPER: 33 % (ref 33–55)

## 2013-09-24 DIAGNOSIS — Z95818 Presence of other cardiac implants and grafts: Secondary | ICD-10-CM | POA: Diagnosis not present

## 2013-09-24 DIAGNOSIS — R55 Syncope and collapse: Secondary | ICD-10-CM | POA: Diagnosis not present

## 2013-09-26 DIAGNOSIS — Z79899 Other long term (current) drug therapy: Secondary | ICD-10-CM | POA: Diagnosis not present

## 2013-09-26 DIAGNOSIS — Z791 Long term (current) use of non-steroidal anti-inflammatories (NSAID): Secondary | ICD-10-CM | POA: Diagnosis not present

## 2013-09-26 DIAGNOSIS — M79609 Pain in unspecified limb: Secondary | ICD-10-CM | POA: Diagnosis not present

## 2013-09-26 DIAGNOSIS — Z885 Allergy status to narcotic agent status: Secondary | ICD-10-CM | POA: Diagnosis not present

## 2013-09-26 DIAGNOSIS — K219 Gastro-esophageal reflux disease without esophagitis: Secondary | ICD-10-CM | POA: Diagnosis not present

## 2013-09-26 DIAGNOSIS — M25519 Pain in unspecified shoulder: Secondary | ICD-10-CM | POA: Diagnosis not present

## 2013-09-26 DIAGNOSIS — F17200 Nicotine dependence, unspecified, uncomplicated: Secondary | ICD-10-CM | POA: Diagnosis not present

## 2013-09-26 DIAGNOSIS — I10 Essential (primary) hypertension: Secondary | ICD-10-CM | POA: Diagnosis not present

## 2013-09-29 DIAGNOSIS — M25519 Pain in unspecified shoulder: Secondary | ICD-10-CM | POA: Diagnosis not present

## 2013-09-30 ENCOUNTER — Ambulatory Visit: Payer: Medicare Other | Admitting: Infectious Diseases

## 2013-09-30 DIAGNOSIS — Z888 Allergy status to other drugs, medicaments and biological substances status: Secondary | ICD-10-CM | POA: Diagnosis not present

## 2013-09-30 DIAGNOSIS — F17200 Nicotine dependence, unspecified, uncomplicated: Secondary | ICD-10-CM | POA: Diagnosis not present

## 2013-09-30 DIAGNOSIS — I10 Essential (primary) hypertension: Secondary | ICD-10-CM | POA: Diagnosis not present

## 2013-09-30 DIAGNOSIS — Z79899 Other long term (current) drug therapy: Secondary | ICD-10-CM | POA: Diagnosis not present

## 2013-09-30 DIAGNOSIS — M25519 Pain in unspecified shoulder: Secondary | ICD-10-CM | POA: Diagnosis not present

## 2013-09-30 DIAGNOSIS — S46909A Unspecified injury of unspecified muscle, fascia and tendon at shoulder and upper arm level, unspecified arm, initial encounter: Secondary | ICD-10-CM | POA: Diagnosis not present

## 2013-09-30 DIAGNOSIS — M542 Cervicalgia: Secondary | ICD-10-CM | POA: Diagnosis not present

## 2013-09-30 DIAGNOSIS — R079 Chest pain, unspecified: Secondary | ICD-10-CM | POA: Diagnosis not present

## 2013-09-30 DIAGNOSIS — K219 Gastro-esophageal reflux disease without esophagitis: Secondary | ICD-10-CM | POA: Diagnosis not present

## 2013-09-30 DIAGNOSIS — S4980XA Other specified injuries of shoulder and upper arm, unspecified arm, initial encounter: Secondary | ICD-10-CM | POA: Diagnosis not present

## 2013-10-01 DIAGNOSIS — F17200 Nicotine dependence, unspecified, uncomplicated: Secondary | ICD-10-CM | POA: Diagnosis not present

## 2013-10-01 DIAGNOSIS — S46909A Unspecified injury of unspecified muscle, fascia and tendon at shoulder and upper arm level, unspecified arm, initial encounter: Secondary | ICD-10-CM | POA: Diagnosis not present

## 2013-10-01 DIAGNOSIS — S4980XA Other specified injuries of shoulder and upper arm, unspecified arm, initial encounter: Secondary | ICD-10-CM | POA: Diagnosis not present

## 2013-10-01 DIAGNOSIS — M129 Arthropathy, unspecified: Secondary | ICD-10-CM | POA: Diagnosis not present

## 2013-10-01 DIAGNOSIS — Z885 Allergy status to narcotic agent status: Secondary | ICD-10-CM | POA: Diagnosis not present

## 2013-10-01 DIAGNOSIS — Z79899 Other long term (current) drug therapy: Secondary | ICD-10-CM | POA: Diagnosis not present

## 2013-10-01 DIAGNOSIS — G589 Mononeuropathy, unspecified: Secondary | ICD-10-CM | POA: Diagnosis not present

## 2013-10-01 DIAGNOSIS — M25519 Pain in unspecified shoulder: Secondary | ICD-10-CM | POA: Diagnosis not present

## 2013-10-01 DIAGNOSIS — J45909 Unspecified asthma, uncomplicated: Secondary | ICD-10-CM | POA: Diagnosis not present

## 2013-10-01 DIAGNOSIS — I10 Essential (primary) hypertension: Secondary | ICD-10-CM | POA: Diagnosis not present

## 2013-10-01 DIAGNOSIS — K219 Gastro-esophageal reflux disease without esophagitis: Secondary | ICD-10-CM | POA: Diagnosis not present

## 2013-10-01 DIAGNOSIS — IMO0001 Reserved for inherently not codable concepts without codable children: Secondary | ICD-10-CM | POA: Diagnosis not present

## 2013-10-06 DIAGNOSIS — M67919 Unspecified disorder of synovium and tendon, unspecified shoulder: Secondary | ICD-10-CM | POA: Diagnosis not present

## 2013-10-06 DIAGNOSIS — M25519 Pain in unspecified shoulder: Secondary | ICD-10-CM | POA: Diagnosis not present

## 2013-10-06 DIAGNOSIS — M719 Bursopathy, unspecified: Secondary | ICD-10-CM | POA: Diagnosis not present

## 2013-10-13 DIAGNOSIS — M25519 Pain in unspecified shoulder: Secondary | ICD-10-CM | POA: Diagnosis not present

## 2013-10-15 DIAGNOSIS — I10 Essential (primary) hypertension: Secondary | ICD-10-CM | POA: Diagnosis not present

## 2013-10-15 DIAGNOSIS — F17200 Nicotine dependence, unspecified, uncomplicated: Secondary | ICD-10-CM | POA: Diagnosis not present

## 2013-10-15 DIAGNOSIS — R238 Other skin changes: Secondary | ICD-10-CM | POA: Diagnosis not present

## 2013-10-20 ENCOUNTER — Ambulatory Visit (INDEPENDENT_AMBULATORY_CARE_PROVIDER_SITE_OTHER): Payer: Medicare Other | Admitting: Infectious Diseases

## 2013-10-20 ENCOUNTER — Encounter: Payer: Self-pay | Admitting: Infectious Diseases

## 2013-10-20 VITALS — BP 130/92 | HR 107 | Temp 97.5°F | Ht 66.0 in | Wt 156.0 lb

## 2013-10-20 DIAGNOSIS — N879 Dysplasia of cervix uteri, unspecified: Secondary | ICD-10-CM | POA: Diagnosis not present

## 2013-10-20 DIAGNOSIS — Z79899 Other long term (current) drug therapy: Secondary | ICD-10-CM

## 2013-10-20 DIAGNOSIS — B20 Human immunodeficiency virus [HIV] disease: Secondary | ICD-10-CM | POA: Diagnosis not present

## 2013-10-20 DIAGNOSIS — F319 Bipolar disorder, unspecified: Secondary | ICD-10-CM

## 2013-10-20 DIAGNOSIS — Z113 Encounter for screening for infections with a predominantly sexual mode of transmission: Secondary | ICD-10-CM

## 2013-10-20 DIAGNOSIS — Z87891 Personal history of nicotine dependence: Secondary | ICD-10-CM | POA: Diagnosis not present

## 2013-10-20 DIAGNOSIS — Z23 Encounter for immunization: Secondary | ICD-10-CM | POA: Diagnosis not present

## 2013-10-20 NOTE — Addendum Note (Signed)
Addended by: Landis Gandy on: 10/20/2013 12:21 PM   Modules accepted: Orders

## 2013-10-20 NOTE — Progress Notes (Signed)
   Subjective:    Patient ID: Latoya Wilson, female    DOB: [birth year], 53 y.o.   MRN: [id number]  HPI 53 yo F with hx of HIV+, HTN, bipolar d/o. Taking ATVr/TRV. Had normal PAP Dec 2014 (previous abn).  Was Hep B S Ab- at last draw.  Has quit smoking, on patch now. Has been losing wt. Was seen in ED x3 for pressure in chest, pain shooting up her arm. Her Troponins were (-), her CK was (-) also. Cervical spine films also negative. Had WBC 19 at that time.  Is off cholesterol rx.  Recently had event monitor, she is not sure what result was?  HIV 1 RNA Quant (copies/mL)  Date Value  09/16/2013 <20   03/03/2013 <20   09/04/2012 <20      CD4 T Cell Abs (/uL)  Date Value  09/16/2013 540   03/03/2013 630   09/04/2012 340*    Review of Systems  Constitutional: Negative for appetite change and unexpected weight change.  Gastrointestinal: Negative for diarrhea and constipation.  Genitourinary: Negative for difficulty urinating.       Objective:   Physical Exam  Constitutional: She appears well-developed and well-nourished.  HENT:  Mouth/Throat: No oropharyngeal exudate.  Eyes: EOM are normal. Pupils are equal, round, and reactive to light.  Neck: Neck supple.  Cardiovascular: Normal rate, regular rhythm and normal heart sounds.   Pulmonary/Chest: Effort normal and breath sounds normal.  Abdominal: Soft. Bowel sounds are normal. She exhibits no distension. There is no tenderness.  Lymphadenopathy:    She has no cervical adenopathy.  Psychiatric: Her speech is rapid and/or pressured. She is hyperactive.          Assessment & Plan:

## 2013-10-20 NOTE — Assessment & Plan Note (Signed)
Will mark as resolved, has 2 consecutive normals.

## 2013-10-20 NOTE — Assessment & Plan Note (Signed)
Greatly appreciate psych f/u.

## 2013-10-20 NOTE — Assessment & Plan Note (Signed)
She is doing very well. Will restart her Hep B series. She is given condoms. See her back in 6 months.

## 2013-10-20 NOTE — Assessment & Plan Note (Signed)
She has quit. Encouraged her to stop. She is glad to have the extra $.

## 2013-10-24 DIAGNOSIS — R55 Syncope and collapse: Secondary | ICD-10-CM | POA: Diagnosis not present

## 2013-10-24 DIAGNOSIS — Z95818 Presence of other cardiac implants and grafts: Secondary | ICD-10-CM | POA: Diagnosis not present

## 2013-10-29 DIAGNOSIS — N289 Disorder of kidney and ureter, unspecified: Secondary | ICD-10-CM | POA: Diagnosis not present

## 2013-10-30 ENCOUNTER — Emergency Department (HOSPITAL_BASED_OUTPATIENT_CLINIC_OR_DEPARTMENT_OTHER): Payer: Medicare Other

## 2013-10-30 ENCOUNTER — Emergency Department (HOSPITAL_BASED_OUTPATIENT_CLINIC_OR_DEPARTMENT_OTHER)
Admission: EM | Admit: 2013-10-30 | Discharge: 2013-10-30 | Disposition: A | Discharge status: Home / Self Care | Payer: Medicare Other | Attending: Emergency Medicine | Admitting: Emergency Medicine

## 2013-10-30 ENCOUNTER — Encounter (HOSPITAL_BASED_OUTPATIENT_CLINIC_OR_DEPARTMENT_OTHER): Payer: Self-pay | Admitting: Emergency Medicine

## 2013-10-30 DIAGNOSIS — W208XXA Other cause of strike by thrown, projected or falling object, initial encounter: Secondary | ICD-10-CM | POA: Insufficient documentation

## 2013-10-30 DIAGNOSIS — G40909 Epilepsy, unspecified, not intractable, without status epilepticus: Secondary | ICD-10-CM | POA: Diagnosis not present

## 2013-10-30 DIAGNOSIS — Z7982 Long term (current) use of aspirin: Secondary | ICD-10-CM | POA: Insufficient documentation

## 2013-10-30 DIAGNOSIS — S99919A Unspecified injury of unspecified ankle, initial encounter: Secondary | ICD-10-CM | POA: Diagnosis not present

## 2013-10-30 DIAGNOSIS — F3130 Bipolar disorder, current episode depressed, mild or moderate severity, unspecified: Secondary | ICD-10-CM | POA: Diagnosis not present

## 2013-10-30 DIAGNOSIS — S9031XA Contusion of right foot, initial encounter: Secondary | ICD-10-CM

## 2013-10-30 DIAGNOSIS — I10 Essential (primary) hypertension: Secondary | ICD-10-CM | POA: Insufficient documentation

## 2013-10-30 DIAGNOSIS — IMO0002 Reserved for concepts with insufficient information to code with codable children: Secondary | ICD-10-CM | POA: Diagnosis not present

## 2013-10-30 DIAGNOSIS — Y929 Unspecified place or not applicable: Secondary | ICD-10-CM | POA: Insufficient documentation

## 2013-10-30 DIAGNOSIS — Y9389 Activity, other specified: Secondary | ICD-10-CM | POA: Insufficient documentation

## 2013-10-30 DIAGNOSIS — S8990XA Unspecified injury of unspecified lower leg, initial encounter: Secondary | ICD-10-CM | POA: Diagnosis not present

## 2013-10-30 DIAGNOSIS — Z21 Asymptomatic human immunodeficiency virus [HIV] infection status: Secondary | ICD-10-CM | POA: Diagnosis not present

## 2013-10-30 DIAGNOSIS — S99929A Unspecified injury of unspecified foot, initial encounter: Secondary | ICD-10-CM | POA: Diagnosis present

## 2013-10-30 DIAGNOSIS — Z79899 Other long term (current) drug therapy: Secondary | ICD-10-CM | POA: Diagnosis not present

## 2013-10-30 DIAGNOSIS — Z87891 Personal history of nicotine dependence: Secondary | ICD-10-CM | POA: Insufficient documentation

## 2013-10-30 DIAGNOSIS — S9030XA Contusion of unspecified foot, initial encounter: Secondary | ICD-10-CM | POA: Insufficient documentation

## 2013-10-30 MED ORDER — HYDROCODONE-ACETAMINOPHEN 5-325 MG PO TABS: 2.0000 | ORAL_TABLET | ORAL | Status: DC | PRN

## 2013-10-30 MED ORDER — HYDROCODONE-ACETAMINOPHEN 5-325 MG PO TABS
2.0000 | ORAL_TABLET | Freq: Once | ORAL | Status: AC
  Administered 2013-10-30: 2 via ORAL
  Filled 2013-10-30: qty 2
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
## 2013-10-30 NOTE — ED Provider Notes (Signed)
Medical screening examination/treatment/procedure(s) were performed by non-physician practitioner and as supervising physician I was immediately available for consultation/collaboration.   EKG Interpretation None       Threasa Beards, MD 10/30/13 1340

## 2013-10-30 NOTE — Discharge Instructions (Signed)
Blunt Trauma °You have been evaluated for injuries. You have been examined and your caregiver has not found injuries serious enough to require hospitalization. °It is common to have multiple bruises and sore muscles following an accident. These tend to feel worse for the first 24 hours. You will feel more stiffness and soreness over the next several hours and worse when you wake up the first morning after your accident. After this point, you should begin to improve with each passing day. The amount of improvement depends on the amount of damage done in the accident. °Following your accident, if some part of your body does not work as it should, or if the pain in any area continues to increase, you should return to the Emergency Department for re-evaluation.  °HOME CARE INSTRUCTIONS  °Routine care for sore areas should include: °· Ice to sore areas every 2 hours for 20 minutes while awake for the next 2 days. °· Drink extra fluids (not alcohol). °· Take a hot or warm shower or bath once or twice a day to increase blood flow to sore muscles. This will help you "limber up". °· Activity as tolerated. Lifting may aggravate neck or back pain. °· Only take over-the-counter or prescription medicines for pain, discomfort, or fever as directed by your caregiver. Do not use aspirin. This may increase bruising or increase bleeding if there are small areas where this is happening. °SEEK IMMEDIATE MEDICAL CARE IF: °· Numbness, tingling, weakness, or problem with the use of your arms or legs. °· A severe headache is not relieved with medications. °· There is a change in bowel or bladder control. °· Increasing pain in any areas of the body. °· Short of breath or dizzy. °· Nauseated, vomiting, or sweating. °· Increasing belly (abdominal) discomfort. °· Blood in urine, stool, or vomiting blood. °· Pain in either shoulder in an area where a shoulder strap would be. °· Feelings of lightheadedness or if you have a fainting  episode. °Sometimes it is not possible to identify all injuries immediately after the trauma. It is important that you continue to monitor your condition after the emergency department visit. If you feel you are not improving, or improving more slowly than should be expected, call your physician. If you feel your symptoms (problems) are worsening, return to the Emergency Department immediately. °Document Released: 12/14/2000 Document Revised: 06/12/2011 Document Reviewed: 11/06/2007 °ExitCare® Patient Information ©2015 [employer]. This information is not intended to replace advice given to you by your health care provider. Make sure you discuss any questions you have with your health care provider. ° °Contusion °A contusion is a deep bruise. Contusions are the result of an injury that caused bleeding under the skin. The contusion may turn blue, purple, or yellow. Minor injuries will give you a painless contusion, but more severe contusions may stay painful and swollen for a few weeks.  °CAUSES  °A contusion is usually caused by a blow, trauma, or direct force to an area of the body. °SYMPTOMS  °· Swelling and redness of the injured area. °· Bruising of the injured area. °· Tenderness and soreness of the injured area. °· Pain. °DIAGNOSIS  °The diagnosis can be made by taking a history and physical exam. An X-ray, CT scan, or MRI may be needed to determine if there were any associated injuries, such as fractures. °TREATMENT  °Specific treatment will depend on what area of the body was injured. In general, the best treatment for a contusion is resting, icing, elevating,   and applying cold compresses to the injured area. Over-the-counter medicines may also be recommended for pain control. Ask your caregiver what the best treatment is for your contusion. °HOME CARE INSTRUCTIONS  °· Put ice on the injured area. °¨ Put ice in a plastic bag. °¨ Place a towel between your skin and the bag. °¨ Leave the ice on for 15-20  minutes, 3-4 times a day, or as directed by your health care provider. °· Only take over-the-counter or prescription medicines for pain, discomfort, or fever as directed by your caregiver. Your caregiver may recommend avoiding anti-inflammatory medicines (aspirin, ibuprofen, and naproxen) for 48 hours because these medicines may increase bruising. °· Rest the injured area. °· If possible, elevate the injured area to reduce swelling. °SEEK IMMEDIATE MEDICAL CARE IF:  °· You have increased bruising or swelling. °· You have pain that is getting worse. °· Your swelling or pain is not relieved with medicines. °MAKE SURE YOU:  °· Understand these instructions. °· Will watch your condition. °· Will get help right away if you are not doing well or get worse. °Document Released: 12/28/2004 Document Revised: 03/25/2013 Document Reviewed: 01/23/2011 °ExitCare® Patient Information ©2015 [employer]. This information is not intended to replace advice given to you by your health care provider. Make sure you discuss any questions you have with your health care provider. ° °

## 2013-10-30 NOTE — ED Provider Notes (Signed)
CSN: [id number]     Arrival date & time 10/30/13  1120 History   First MD Initiated Contact with Patient 10/30/13 1214     Chief Complaint  Patient presents with  . Foot Injury    right     (Consider location/radiation/quality/duration/timing/severity/associated sxs/prior Treatment) Patient is a 53 y.o. female presenting with foot injury. The history is provided by the patient.  Foot Injury Location:  Foot Time since incident:  2 hours Injury: yes   Mechanism of injury: crush   Crush injury:    Mechanism:  Falling object   Approximate weight of object:  Sanders location:  R foot Pain details:    Quality:  Aching   Radiates to:  Does not radiate   Severity:  Moderate   Onset quality:  Gradual   Timing:  Constant   Progression:  Worsening Chronicity:  New Foreign body present:  No foreign bodies Relieved by:  Nothing Worsened by:  Nothing tried Associated symptoms: no swelling   Risk factors: no recent illness     Past Medical History  Diagnosis Date  . HIV infection   . Hypertension   . Hyperlipidemia   . Bipolar affective disorder   . Restless leg syndrome   . Neuropathy   . Seizures   . Sleep apnea   . Narcolepsy    Past Surgical History  Procedure Laterality Date  . Shoulder surgery     Family History  Problem Relation Age of Onset  . Mitral valve prolapse Mother   . Heart disease Father    History  Substance Use Topics  . Smoking status: Former Smoker -- 1.00 packs/day for 35 years    Types: Cigarettes  . Smokeless tobacco: Never Used  . Alcohol Use: No   OB History   Grav Para Term Preterm Abortions TAB SAB Ect Mult Living                 Review of Systems  Musculoskeletal: Positive for joint swelling and myalgias.  All other systems reviewed and are negative.     Allergies  Oxycodone  Home Medications   Prior to Admission medications   Medication Sig Start Date End Date Taking? Authorizing Provider  albuterol (PROVENTIL)  (2.5 MG/3ML) 0.083% nebulizer solution Take 2.5 mg by nebulization every 6 (six) hours as needed for wheezing or shortness of breath.    Historical Provider, MD  amLODipine (NORVASC) 10 MG tablet Take 10 mg by mouth daily.    Historical Provider, MD  aspirin 81 MG tablet Take 81 mg by mouth daily.    Historical Provider, MD  atazanavir (REYATAZ) 300 MG capsule Take 1 capsule (300 mg total) by mouth daily with breakfast. 09/16/13   Campbell Riches, MD  beclomethasone (QVAR) 80 MCG/ACT inhaler Inhale 2 puffs into the lungs as needed.    Historical Provider, MD  cyanocobalamin (,VITAMIN B-12,) 1000 MCG/ML injection Inject 1,000 mcg into the muscle every 30 (thirty) days. Around the 5th of every month    Historical Provider, MD  diazepam (VALIUM) 10 MG tablet  10/01/13   Historical Provider, MD  emtricitabine-tenofovir (TRUVADA) 200-300 MG per tablet Take 1 tablet by mouth daily. 09/16/13   Campbell Riches, MD  HYDROcodone-acetaminophen (NORCO/VICODIN) 5-325 MG per tablet Take 1 tablet by mouth every 6 (six) hours as needed for moderate pain. surgeon    Historical Provider, MD  methocarbamol (ROBAXIN) 500 MG tablet Take 500 mg by mouth 2 (two) times daily.  Historical Provider, MD  modafinil (PROVIGIL) 100 MG tablet Take 200 mg by mouth daily.     Historical Provider, MD  omeprazole (PRILOSEC) 40 MG capsule Take 40 mg by mouth daily.    Historical Provider, MD  piroxicam (FELDENE) 10 MG capsule Take 10 mg by mouth daily.    Historical Provider, MD  pregabalin (LYRICA) 150 MG capsule Take 200 mg by mouth 2 (two) times daily.     Historical Provider, MD  ritonavir (NORVIR) 100 MG capsule Take 1 capsule (100 mg total) by mouth daily. 09/16/13   Campbell Riches, MD  tolterodine (DETROL LA) 4 MG 24 hr capsule Take 4 mg by mouth. 12/23/12 12/23/13  Historical Provider, MD  tretinoin (RETIN-A) 0.025 % cream Apply topically at bedtime.    Historical Provider, MD  venlafaxine (EFFEXOR-XR) 150 MG 24 hr capsule  Take 225 mg by mouth every morning.     Historical Provider, MD   BP 126/85  Pulse 106  Temp(Src) 98.2 F (36.8 C) (Oral)  Resp 20  Ht 5\' 5"  (1.651 m)  Wt 156 lb (70.761 kg)  BMI 25.96 kg/m2  SpO2 100%  LMP 06/23/2013 Physical Exam  Nursing note and vitals reviewed. Constitutional: She is oriented to person, place, and time.  Musculoskeletal: She exhibits tenderness.  Swollen right foot,  Bruise base of 3rd and 4th metatarsals,  nv and ns intact  Neurological: She is alert and oriented to person, place, and time. She has normal reflexes.  Skin: Skin is warm.  Psychiatric: She has a normal mood and affect.    ED Course  Procedures (including critical care time) Labs Review Labs Reviewed - No data to display  Imaging Review No results found.   EKG Interpretation None      MDM   Final diagnoses:  Contusion, foot, right, initial encounter    Ace wrap Post op shoe Hydrocodone Rx Follow up with your Physician for recheck    Fransico Meadow, PA-C 10/30/13 [place], PA-C 10/30/13 1328

## 2013-11-20 ENCOUNTER — Ambulatory Visit (INDEPENDENT_AMBULATORY_CARE_PROVIDER_SITE_OTHER): Payer: Medicare HMO | Admitting: *Deleted

## 2013-11-20 DIAGNOSIS — Z23 Encounter for immunization: Secondary | ICD-10-CM

## 2013-11-20 DIAGNOSIS — B20 Human immunodeficiency virus [HIV] disease: Secondary | ICD-10-CM

## 2013-12-15 ENCOUNTER — Telehealth: Payer: Self-pay | Admitting: *Deleted

## 2013-12-15 NOTE — Telephone Encounter (Signed)
Per the pharmacy, Latoya Wilson, [place] with [facility], [phone].  The pt has had the prescription on her medication profile at the pharmacy but has never had it filled there.

## 2013-12-15 NOTE — Telephone Encounter (Signed)
[employer] requiring PA for Reyataz.  Faxed copy will need completed by Dr. Johnnye Sima, issue relates to omeprazole rx from another provider.

## 2013-12-15 NOTE — Telephone Encounter (Signed)
Who is other provider?

## 2014-01-01 NOTE — Telephone Encounter (Signed)
Received fax from [place] does not need prior authorization. Pharmacy notified and medication was already picked up.

## 2014-04-13 ENCOUNTER — Other Ambulatory Visit: Payer: Self-pay | Admitting: *Deleted

## 2014-04-13 DIAGNOSIS — B20 Human immunodeficiency virus [HIV] disease: Secondary | ICD-10-CM

## 2014-04-13 MED ORDER — ATAZANAVIR SULFATE 300 MG PO CAPS: 300.0000 mg | ORAL_CAPSULE | Freq: Every day | ORAL | Status: DC

## 2014-04-13 MED ORDER — RITONAVIR 100 MG PO CAPS: 100.0000 mg | ORAL_CAPSULE | Freq: Every day | ORAL | Status: DC

## 2014-04-13 MED ORDER — EMTRICITABINE-TENOFOVIR DF 200-300 MG PO TABS: 1.0000 | ORAL_TABLET | Freq: Every day | ORAL | Status: DC

## 2014-04-20 ENCOUNTER — Other Ambulatory Visit: Payer: Medicare Other

## 2014-04-20 ENCOUNTER — Other Ambulatory Visit: Payer: Commercial Managed Care - HMO

## 2014-04-20 DIAGNOSIS — Z79899 Other long term (current) drug therapy: Secondary | ICD-10-CM

## 2014-04-20 DIAGNOSIS — Z113 Encounter for screening for infections with a predominantly sexual mode of transmission: Secondary | ICD-10-CM

## 2014-04-20 DIAGNOSIS — B20 Human immunodeficiency virus [HIV] disease: Secondary | ICD-10-CM

## 2014-04-20 LAB — COMPLETE METABOLIC PANEL WITH GFR
ALBUMIN: 4.4 g/dL (ref 3.5–5.2)
ALT: 20 U/L (ref 0–35)
AST: 22 U/L (ref 0–37)
Alkaline Phosphatase: 139 U/L — ABNORMAL HIGH (ref 39–117)
BUN: 16 mg/dL (ref 6–23)
CALCIUM: 9.6 mg/dL (ref 8.4–10.5)
CO2: 26 mEq/L (ref 19–32)
CREATININE: 0.85 mg/dL (ref 0.50–1.10)
Chloride: 100 mEq/L (ref 96–112)
GFR, Est African American: >89 mL/min
GFR, Est Non African American: 78 mL/min
Glucose, Bld: 107 mg/dL — ABNORMAL HIGH (ref 70–99)
POTASSIUM: 3.7 meq/L (ref 3.5–5.3)
Sodium: 136 mEq/L (ref 135–145)
Total Bilirubin: 1.1 mg/dL (ref 0.2–1.2)
Total Protein: 7.2 g/dL (ref 6.0–8.3)

## 2014-04-20 LAB — CBC WITH DIFFERENTIAL/PLATELET
BASOS ABS: 0.1 10*3/uL (ref 0.0–0.1)
Basophils Relative: 1 % (ref 0–1)
Eosinophils Absolute: 0.1 10*3/uL (ref 0.0–0.7)
Eosinophils Relative: 1 % (ref 0–5)
HEMATOCRIT: 44.1 % (ref 36.0–46.0)
Hemoglobin: 15 g/dL (ref 12.0–15.0)
LYMPHS PCT: 26 % (ref 12–46)
Lymphs Abs: 1.6 10*3/uL (ref 0.7–4.0)
MCH: 31.3 pg (ref 26.0–34.0)
MCHC: 34 g/dL (ref 30.0–36.0)
MCV: 92.1 fL (ref 78.0–100.0)
MPV: 10 fL (ref 8.6–12.4)
Monocytes Absolute: 0.5 10*3/uL (ref 0.1–1.0)
Monocytes Relative: 8 % (ref 3–12)
NEUTROS ABS: 4 10*3/uL (ref 1.7–7.7)
NEUTROS PCT: 64 % (ref 43–77)
Platelets: 272 10*3/uL (ref 150–400)
RBC: 4.79 MIL/uL (ref 3.87–5.11)
RDW: 13.8 % (ref 11.5–15.5)
WBC: 6.2 10*3/uL (ref 4.0–10.5)

## 2014-04-20 LAB — LIPID PANEL
Cholesterol: 204 mg/dL — ABNORMAL HIGH (ref 0–200)
HDL: 62 mg/dL (ref >39)
LDL Cholesterol: 131 mg/dL — ABNORMAL HIGH (ref 0–99)
TRIGLYCERIDES: 57 mg/dL (ref <150)
Total CHOL/HDL Ratio: 3.3 Ratio
VLDL: 11 mg/dL (ref 0–40)

## 2014-04-20 LAB — RPR

## 2014-04-21 LAB — T-HELPER CELL (CD4) - (RCID CLINIC ONLY)
CD4 % Helper T Cell: 34 % (ref 33–55)
CD4 T CELL ABS: 570 /uL (ref 400–2700)

## 2014-04-21 LAB — HIV-1 RNA QUANT-NO REFLEX-BLD
HIV 1 RNA Quant: <20 copies/mL (ref <20)
HIV-1 RNA Quant, Log: <1.3 {Log} (ref <1.30)

## 2014-05-05 ENCOUNTER — Other Ambulatory Visit: Payer: Self-pay | Admitting: Infectious Diseases

## 2014-05-05 ENCOUNTER — Encounter: Payer: Self-pay | Admitting: Infectious Diseases

## 2014-05-05 ENCOUNTER — Ambulatory Visit (INDEPENDENT_AMBULATORY_CARE_PROVIDER_SITE_OTHER): Payer: Medicare HMO | Admitting: Infectious Diseases

## 2014-05-05 VITALS — BP 130/90 | HR 105 | Temp 97.9°F | Ht 65.0 in | Wt 162.0 lb

## 2014-05-05 DIAGNOSIS — B20 Human immunodeficiency virus [HIV] disease: Secondary | ICD-10-CM

## 2014-05-05 DIAGNOSIS — I10 Essential (primary) hypertension: Secondary | ICD-10-CM

## 2014-05-05 DIAGNOSIS — N921 Excessive and frequent menstruation with irregular cycle: Secondary | ICD-10-CM

## 2014-05-05 DIAGNOSIS — F3170 Bipolar disorder, currently in remission, most recent episode unspecified: Secondary | ICD-10-CM

## 2014-05-05 DIAGNOSIS — E7800 Pure hypercholesterolemia, unspecified: Secondary | ICD-10-CM | POA: Insufficient documentation

## 2014-05-05 DIAGNOSIS — Z87891 Personal history of nicotine dependence: Secondary | ICD-10-CM

## 2014-05-05 DIAGNOSIS — Z113 Encounter for screening for infections with a predominantly sexual mode of transmission: Secondary | ICD-10-CM

## 2014-05-05 DIAGNOSIS — Z23 Encounter for immunization: Secondary | ICD-10-CM | POA: Diagnosis not present

## 2014-05-05 DIAGNOSIS — Z1231 Encounter for screening mammogram for malignant neoplasm of breast: Secondary | ICD-10-CM

## 2014-05-05 DIAGNOSIS — Z79899 Other long term (current) drug therapy: Secondary | ICD-10-CM

## 2014-05-05 NOTE — Assessment & Plan Note (Signed)
Borderline.  Encourage to exercise, watch diet.  Will continue to monitor.

## 2014-05-05 NOTE — Assessment & Plan Note (Signed)
Getting weekly counseling.

## 2014-05-05 NOTE — Assessment & Plan Note (Signed)
Will have her seen by GYN for PAP.

## 2014-05-05 NOTE — Assessment & Plan Note (Signed)
Will giver her # for quit line.

## 2014-05-05 NOTE — Progress Notes (Signed)
   Subjective:    Patient ID: Latoya Wilson, female    DOB: 1960-12-02, 54 y.o.   MRN: [id number]  HPI 54 yo F with hx of HIV+, HTN, bipolar d/o. Taking ATVr/TRV. Had normal PAP Dec 2014 (previous abn).  Seeing a counselor every week who is helping her with her anxiety.  Has restarted smoking.   HIV 1 RNA QUANT (copies/mL)  Date Value  04/20/2014 <20  09/16/2013 <20  03/03/2013 <20   CD4 T CELL ABS (/uL)  Date Value  04/20/2014 570  09/16/2013 540  03/03/2013 630    Review of Systems  Constitutional: Negative for appetite change and unexpected weight change.  Gastrointestinal: Negative for diarrhea and constipation.  Genitourinary: Positive for menstrual problem. Negative for difficulty urinating.       Objective:   Physical Exam  Constitutional: She appears well-developed and well-nourished.  HENT:  Mouth/Throat: No oropharyngeal exudate.  Eyes: EOM are normal. Pupils are equal, round, and reactive to light.  Neck: Neck supple.  Cardiovascular: Normal rate, regular rhythm and normal heart sounds.   Pulmonary/Chest: Effort normal and breath sounds normal.  Abdominal: Soft. Bowel sounds are normal. She exhibits no distension. There is no tenderness.  Lymphadenopathy:    She has no cervical adenopathy.  Psychiatric: Her speech is rapid and/or pressured.          Assessment & Plan:

## 2014-05-05 NOTE — Assessment & Plan Note (Signed)
She is doing well. Will continue her current ART.  She is given condoms Given hep B # 3. Flu shot today.  rtc in 6 months.

## 2014-05-05 NOTE — Assessment & Plan Note (Signed)
Normotensive today.  Tachycardia related to her bipolar

## 2014-05-05 NOTE — Addendum Note (Signed)
Addended by: Myrtis Hopping A on: 05/05/2014 03:24 PM   Modules accepted: Orders

## 2014-05-06 ENCOUNTER — Encounter: Payer: Self-pay | Admitting: Obstetrics & Gynecology

## 2014-05-06 ENCOUNTER — Other Ambulatory Visit: Payer: Self-pay | Admitting: Infectious Diseases

## 2014-05-06 ENCOUNTER — Telehealth: Payer: Self-pay | Admitting: *Deleted

## 2014-05-06 DIAGNOSIS — N939 Abnormal uterine and vaginal bleeding, unspecified: Secondary | ICD-10-CM

## 2014-05-06 NOTE — Telephone Encounter (Signed)
Patient referred to [facility] clinic for abnormal bleeding. Per the clinic this needed to be sent electronically via the work que and the order was placed and sent to [facility]. Latoya Wilson

## 2014-05-14 ENCOUNTER — Ambulatory Visit
Admission: RE | Admit: 2014-05-14 | Discharge: 2014-05-14 | Disposition: A | Discharge status: Home / Self Care | Payer: Commercial Managed Care - HMO | Source: Ambulatory Visit | Attending: Infectious Diseases | Admitting: Infectious Diseases

## 2014-05-14 DIAGNOSIS — Z1231 Encounter for screening mammogram for malignant neoplasm of breast: Secondary | ICD-10-CM

## 2014-06-17 ENCOUNTER — Encounter: Payer: Commercial Managed Care - HMO | Admitting: Obstetrics & Gynecology

## 2014-07-22 ENCOUNTER — Ambulatory Visit (INDEPENDENT_AMBULATORY_CARE_PROVIDER_SITE_OTHER): Payer: Medicare HMO | Admitting: Obstetrics & Gynecology

## 2014-07-22 ENCOUNTER — Encounter: Payer: Self-pay | Admitting: Obstetrics & Gynecology

## 2014-07-22 VITALS — BP 127/82 | HR 94 | Temp 97.9°F | Ht 65.0 in | Wt 165.1 lb

## 2014-07-22 DIAGNOSIS — Z01419 Encounter for gynecological examination (general) (routine) without abnormal findings: Secondary | ICD-10-CM

## 2014-07-22 DIAGNOSIS — N924 Excessive bleeding in the premenopausal period: Secondary | ICD-10-CM

## 2014-07-22 NOTE — Progress Notes (Signed)
Patient ID: Latoya Wilson, female   DOB: 08-02-1960, 54 y.o.   MRN: [id number]  Chief Complaint  Patient presents with  . Gynecologic Exam    HPI Latoya Wilson is a 54 y.o. female.  [id number] Patient's last menstrual period was 07/19/2014. Has had DUB in last few months, with bleeding for 1 mo early in the year, spotting now for 2 days. No pain. VMS have ceased HPI  Past Medical History  Diagnosis Date  . HIV infection   . Hypertension   . Hyperlipidemia   . Bipolar affective disorder   . Restless leg syndrome   . Neuropathy   . Seizures   . Sleep apnea   . Narcolepsy     Past Surgical History  Procedure Laterality Date  . Shoulder surgery      Family History  Problem Relation Age of Onset  . Mitral valve prolapse Mother   . Heart disease Father     MI died at 36  . Hyperlipidemia Mother     Social History History  Substance Use Topics  . Smoking status: Former Smoker -- 1.00 packs/day for 35 years    Types: Cigarettes  . Smokeless tobacco: Never Used  . Alcohol Use: No    Allergies  Allergen Reactions  . Oxycodone     seizures    Current Outpatient Prescriptions  Medication Sig Dispense Refill  . albuterol (PROVENTIL) (2.5 MG/3ML) 0.083% nebulizer solution Take 2.5 mg by nebulization every 6 (six) hours as needed for wheezing or shortness of breath.    Marland Kitchen amLODipine (NORVASC) 10 MG tablet Take 10 mg by mouth daily.    Marland Kitchen aspirin 81 MG tablet Take 81 mg by mouth daily.    Marland Kitchen atazanavir (REYATAZ) 300 MG capsule Take 1 capsule (300 mg total) by mouth daily with breakfast. 30 capsule 6  . beclomethasone (QVAR) 80 MCG/ACT inhaler Inhale 2 puffs into the lungs as needed.    . cyanocobalamin (,VITAMIN B-12,) 1000 MCG/ML injection Inject 1,000 mcg into the muscle every 30 (thirty) days. Around the 5th of every month    . emtricitabine-tenofovir (TRUVADA) 200-300 MG per tablet Take 1 tablet by mouth daily. 30 tablet 6  . modafinil (PROVIGIL) 100 MG tablet Take 200 mg by  mouth daily.     . pregabalin (LYRICA) 150 MG capsule Take 200 mg by mouth 2 (two) times daily.     . ritonavir (NORVIR) 100 MG capsule Take 1 capsule (100 mg total) by mouth daily. 30 capsule 6  . tretinoin (RETIN-A) 0.025 % cream Apply topically at bedtime.    . varenicline (CHANTIX) 0.5 MG tablet Take 0.5 mg by mouth 2 (two) times daily. Unsure of dose    . venlafaxine (EFFEXOR-XR) 150 MG 24 hr capsule Take 225 mg by mouth every morning.      No current facility-administered medications for this visit.    Review of Systems Review of Systems  Constitutional: Negative.   Respiratory: Negative.   Genitourinary: Positive for vaginal bleeding and menstrual problem. Negative for vaginal discharge and pelvic pain.    Blood pressure 127/82, pulse 94, temperature 97.9 F (36.6 C), temperature source Oral, height 5\' 5"  (1.651 m), weight 165 lb 1.6 oz (74.889 kg), last menstrual period 07/19/2014.  Physical Exam Physical Exam  Constitutional: She is oriented to person, place, and time. She appears well-developed. No distress.  Pulmonary/Chest: Effort normal. No respiratory distress.  Breasts: breasts appear normal, no suspicious masses, no skin or nipple changes or axillary nodes.  Genitourinary: Uterus normal. No vaginal discharge found.  No mass  Neurological: She is alert and oriented to person, place, and time.  Skin: Skin is warm and dry.  Psychiatric: She has a normal mood and affect. Her behavior is normal.    Data Reviewed Pap smears and Bx result  Assessment    Well woman exam, pap is up to date   Perimenopause  Plan    Pap 03/2016, yearly breast exam and mammography        Latoya Wilson 07/22/2014, 1:53 PM

## 2014-07-22 NOTE — Patient Instructions (Signed)
Mammography Mammography is an X-ray of the breasts to look for changes that are not normal. The X-ray image is called a mammogram. This procedure can screen for breast cancer, can detect cancer early, and can diagnose cancer.  LET YOUR CAREGIVER KNOW ABOUT:  Breast implants.  Previous breast disease, biopsy, or surgery.  If you are breastfeeding.  Medicines taken, including vitamins, herbs, eyedrops, over-the-counter medicines, and creams.  Use of steroids (by mouth or creams).  Possibility of pregnancy, if this applies. RISKS AND COMPLICATIONS  Exposure to radiation, but at very low levels.  The results may be misinterpreted.  The results may not be accurate.  Mammography may lead to further tests.  Mammography may not catch certain cancers. BEFORE THE PROCEDURE  Schedule your test about 7 days after your menstrual period. This is when your breasts are the least tender and have signs of hormone changes.  If you have had a mammography done at a different facility in the past, get the mammogram X-rays or have them sent to your current exam facility in order to compare them.  Wash your breasts and under your arms the day of the test.  Do not wear deodorants, perfumes, or powders anywhere on your body.  Wear clothes that you can change in and out of easily. PROCEDURE Relax as much as possible during the test. Any discomfort during the test will be very brief. The test should take less than 30 minutes. The following will happen:  You will undress from the waist up and put on a gown.  You will stand in front of the X-ray machine.  Each breast will be placed between 2 plastic or glass plates. The plates will compress your breast for a few seconds.  X-rays will be taken from different angles of the breast. AFTER THE PROCEDURE  The mammogram will be examined.  Depending on the quality of the images, you may need to repeat certain parts of the test.  Ask when your test  results will be ready. Make sure you get your test results.  You may resume normal activities. Document Released: 03/17/2000 Document Revised: 06/12/2011 Document Reviewed: 01/08/2011 ExitCare Patient Information 2015 [employer]. This information is not intended to replace advice given to you by your health care provider. Make sure you discuss any questions you have with your health care provider.  

## 2014-10-26 ENCOUNTER — Other Ambulatory Visit: Payer: Commercial Managed Care - HMO

## 2014-11-03 ENCOUNTER — Other Ambulatory Visit: Payer: Self-pay | Admitting: *Deleted

## 2014-11-03 DIAGNOSIS — B20 Human immunodeficiency virus [HIV] disease: Secondary | ICD-10-CM

## 2014-11-03 MED ORDER — EMTRICITABINE-TENOFOVIR DF 200-300 MG PO TABS: 1.0000 | ORAL_TABLET | Freq: Every day | ORAL | Status: DC

## 2014-11-03 MED ORDER — RITONAVIR 100 MG PO CAPS: 100.0000 mg | ORAL_CAPSULE | Freq: Every day | ORAL | Status: DC

## 2014-11-03 MED ORDER — ATAZANAVIR SULFATE 300 MG PO CAPS: 300.0000 mg | ORAL_CAPSULE | Freq: Every day | ORAL | Status: DC

## 2014-11-09 ENCOUNTER — Ambulatory Visit: Payer: Commercial Managed Care - HMO | Admitting: Infectious Diseases

## 2015-01-18 ENCOUNTER — Other Ambulatory Visit: Payer: Self-pay | Admitting: *Deleted

## 2015-01-18 DIAGNOSIS — B20 Human immunodeficiency virus [HIV] disease: Secondary | ICD-10-CM

## 2015-01-18 MED ORDER — ATAZANAVIR SULFATE 300 MG PO CAPS: 300.0000 mg | ORAL_CAPSULE | Freq: Every day | ORAL | Status: DC

## 2015-01-18 MED ORDER — EMTRICITABINE-TENOFOVIR DF 200-300 MG PO TABS: 1.0000 | ORAL_TABLET | Freq: Every day | ORAL | Status: DC

## 2015-01-18 MED ORDER — RITONAVIR 100 MG PO CAPS: 100.0000 mg | ORAL_CAPSULE | Freq: Every day | ORAL | Status: DC

## 2015-01-18 NOTE — Telephone Encounter (Signed)
Pt needs to make a follow-up MD/Lab visit

## 2015-01-22 ENCOUNTER — Ambulatory Visit (HOSPITAL_COMMUNITY): Payer: Medicare HMO | Admitting: Psychiatry

## 2015-01-25 ENCOUNTER — Ambulatory Visit (HOSPITAL_COMMUNITY): Payer: Medicare HMO | Admitting: Psychiatry

## 2015-01-27 ENCOUNTER — Other Ambulatory Visit (HOSPITAL_COMMUNITY)
Admission: RE | Admit: 2015-01-27 | Discharge: 2015-01-27 | Disposition: A | Discharge status: Home / Self Care | Payer: Medicare HMO | Source: Ambulatory Visit | Attending: Infectious Diseases | Admitting: Infectious Diseases

## 2015-01-27 ENCOUNTER — Ambulatory Visit (HOSPITAL_COMMUNITY): Payer: Medicare HMO | Admitting: Licensed Clinical Social Worker

## 2015-01-27 ENCOUNTER — Other Ambulatory Visit: Payer: Medicare HMO

## 2015-01-27 DIAGNOSIS — B20 Human immunodeficiency virus [HIV] disease: Secondary | ICD-10-CM

## 2015-01-27 DIAGNOSIS — Z113 Encounter for screening for infections with a predominantly sexual mode of transmission: Secondary | ICD-10-CM | POA: Diagnosis present

## 2015-01-27 DIAGNOSIS — Z79899 Other long term (current) drug therapy: Secondary | ICD-10-CM

## 2015-01-27 LAB — LIPID PANEL
CHOL/HDL RATIO: 3.5 ratio (ref <=5.0)
Cholesterol: 180 mg/dL (ref 125–200)
HDL: 52 mg/dL (ref >=46)
LDL CALC: 93 mg/dL (ref <130)
Triglycerides: 174 mg/dL — ABNORMAL HIGH (ref <150)
VLDL: 35 mg/dL — ABNORMAL HIGH (ref <30)

## 2015-01-27 LAB — COMPREHENSIVE METABOLIC PANEL
ALBUMIN: 4.2 g/dL (ref 3.6–5.1)
ALT: 21 U/L (ref 6–29)
AST: 19 U/L (ref 10–35)
Alkaline Phosphatase: 165 U/L — ABNORMAL HIGH (ref 33–130)
BUN: 19 mg/dL (ref 7–25)
CALCIUM: 9.5 mg/dL (ref 8.6–10.4)
CO2: 27 mmol/L (ref 20–31)
Chloride: 104 mmol/L (ref 98–110)
Creat: 1 mg/dL (ref 0.50–1.05)
Glucose, Bld: 101 mg/dL — ABNORMAL HIGH (ref 65–99)
POTASSIUM: 3.7 mmol/L (ref 3.5–5.3)
Sodium: 140 mmol/L (ref 135–146)
Total Bilirubin: 1.4 mg/dL — ABNORMAL HIGH (ref 0.2–1.2)
Total Protein: 6.6 g/dL (ref 6.1–8.1)

## 2015-01-27 LAB — CBC
HCT: 40.5 % (ref 36.0–46.0)
HEMOGLOBIN: 14 g/dL (ref 12.0–15.0)
MCH: 31.1 pg (ref 26.0–34.0)
MCHC: 34.6 g/dL (ref 30.0–36.0)
MCV: 90 fL (ref 78.0–100.0)
MPV: 10.5 fL (ref 8.6–12.4)
Platelets: 246 10*3/uL (ref 150–400)
RBC: 4.5 MIL/uL (ref 3.87–5.11)
RDW: 13 % (ref 11.5–15.5)
WBC: 7.3 10*3/uL (ref 4.0–10.5)

## 2015-01-28 LAB — T-HELPER CELL (CD4) - (RCID CLINIC ONLY)
CD4 % Helper T Cell: 49 % (ref 33–55)
CD4 T Cell Abs: 1070 /uL (ref 400–2700)

## 2015-01-28 LAB — HIV-1 RNA QUANT-NO REFLEX-BLD

## 2015-01-28 LAB — URINE CYTOLOGY ANCILLARY ONLY
Chlamydia: NEGATIVE
NEISSERIA GONORRHEA: NEGATIVE

## 2015-01-28 LAB — RPR

## 2015-01-29 ENCOUNTER — Ambulatory Visit (HOSPITAL_COMMUNITY): Payer: Medicare HMO | Admitting: Psychiatry

## 2015-02-04 ENCOUNTER — Ambulatory Visit (HOSPITAL_COMMUNITY): Payer: Medicare HMO | Admitting: Psychiatry

## 2015-02-04 ENCOUNTER — Ambulatory Visit (INDEPENDENT_AMBULATORY_CARE_PROVIDER_SITE_OTHER): Payer: Medicare HMO | Admitting: Psychiatry

## 2015-02-04 ENCOUNTER — Encounter (HOSPITAL_COMMUNITY): Payer: Self-pay | Admitting: Psychiatry

## 2015-02-04 VITALS — BP 114/62 | HR 98 | Ht 65.0 in | Wt 161.0 lb

## 2015-02-04 DIAGNOSIS — M797 Fibromyalgia: Secondary | ICD-10-CM

## 2015-02-04 DIAGNOSIS — F3132 Bipolar disorder, current episode depressed, moderate: Secondary | ICD-10-CM | POA: Diagnosis not present

## 2015-02-04 DIAGNOSIS — F411 Generalized anxiety disorder: Secondary | ICD-10-CM | POA: Diagnosis not present

## 2015-02-04 MED ORDER — LAMOTRIGINE 25 MG PO TABS: 25.0000 mg | ORAL_TABLET | Freq: Every day | ORAL | Status: DC
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
## 2015-02-04 NOTE — Patient Instructions (Signed)
Abstain from [place] Be regular with your cpap

## 2015-02-18 ENCOUNTER — Ambulatory Visit (INDEPENDENT_AMBULATORY_CARE_PROVIDER_SITE_OTHER): Payer: Medicare HMO | Admitting: Infectious Diseases

## 2015-02-18 ENCOUNTER — Encounter: Payer: Self-pay | Admitting: Infectious Diseases

## 2015-02-18 VITALS — BP 122/81 | HR 90 | Temp 97.7°F | Ht 67.0 in | Wt 160.0 lb

## 2015-02-18 DIAGNOSIS — B20 Human immunodeficiency virus [HIV] disease: Secondary | ICD-10-CM | POA: Diagnosis not present

## 2015-02-18 DIAGNOSIS — Z23 Encounter for immunization: Secondary | ICD-10-CM

## 2015-02-18 DIAGNOSIS — N921 Excessive and frequent menstruation with irregular cycle: Secondary | ICD-10-CM

## 2015-02-18 DIAGNOSIS — F17200 Nicotine dependence, unspecified, uncomplicated: Secondary | ICD-10-CM

## 2015-02-18 DIAGNOSIS — I10 Essential (primary) hypertension: Secondary | ICD-10-CM

## 2015-02-18 DIAGNOSIS — Z113 Encounter for screening for infections with a predominantly sexual mode of transmission: Secondary | ICD-10-CM

## 2015-02-18 DIAGNOSIS — Z79899 Other long term (current) drug therapy: Secondary | ICD-10-CM

## 2015-02-18 DIAGNOSIS — F3170 Bipolar disorder, currently in remission, most recent episode unspecified: Secondary | ICD-10-CM

## 2015-02-18 MED ORDER — ATAZANAVIR-COBICISTAT 300-150 MG PO TABS: 1.0000 | ORAL_TABLET | Freq: Every day | ORAL | Status: DC

## 2015-02-18 MED ORDER — EMTRICITABINE-TENOFOVIR AF 200-25 MG PO TABS: 1.0000 | ORAL_TABLET | Freq: Every day | ORAL | Status: DC

## 2015-02-18 NOTE — Assessment & Plan Note (Signed)
To have PAP next month.  Has seen GYN this month.

## 2015-02-18 NOTE — Assessment & Plan Note (Signed)
Fairly manic, today, will f/u at [facility], with counselor.

## 2015-02-18 NOTE — Progress Notes (Signed)
   Subjective:    Patient ID: Latoya Wilson, female    DOB: 1961/01/21, 55 y.o.   MRN: [id number]  HPI 54 yo F with hx of HIV+, HTN, bipolar d/o. Taking ATVr/TRV. Had normal PAP Dec 2014 (previous abn).  Seeing a counselor every week who is helping her with her anxiety. Was seen by [facility] this fall, started on lamictal. Does feel better on this.  "I am so out of it.. I can do it". Wants to be back on low dose ativan.  Has restarted smoking, now down to 3 cigarettes/day.  Needs to start exercising again.  Saw Gyn this year, is to have PAP in December.   HIV 1 RNA QUANT (copies/mL)  Date Value  01/27/2015 <20  04/20/2014 <20  09/16/2013 <20   CD4 T CELL ABS (/uL)  Date Value  01/27/2015 1070  04/20/2014 570  09/16/2013 540    Review of Systems  Constitutional: Negative for appetite change and unexpected weight change.  Respiratory: Negative for cough and shortness of breath.   Gastrointestinal: Negative for diarrhea and constipation.  Genitourinary: Negative for difficulty urinating.  Psychiatric/Behavioral: Positive for dysphoric mood and decreased concentration. Negative for sleep disturbance.  having anxiety attacks.      Objective:   Physical Exam  Constitutional: She appears well-developed and well-nourished.  HENT:  Mouth/Throat: No oropharyngeal exudate.  Eyes: EOM are normal. Pupils are equal, round, and reactive to light.  Neck: Neck supple.  Cardiovascular: Normal rate, regular rhythm and normal heart sounds.   Pulmonary/Chest: Effort normal and breath sounds normal.  Abdominal: Soft. Bowel sounds are normal. There is no tenderness. There is no rebound.  Lymphadenopathy:    She has no cervical adenopathy.  Psychiatric: Her affect is labile. Her speech is rapid and/or pressured.       Assessment & Plan:

## 2015-02-18 NOTE — Assessment & Plan Note (Signed)
Working on quitting Very close.

## 2015-02-18 NOTE — Assessment & Plan Note (Addendum)
She is doing well.  Will change her medications to ATVc/Descovy Given condoms Gets flu shot today Will see her back in 4 months.

## 2015-02-18 NOTE — Assessment & Plan Note (Signed)
Well controlled, will f/u with PCP

## 2015-02-22 ENCOUNTER — Ambulatory Visit (INDEPENDENT_AMBULATORY_CARE_PROVIDER_SITE_OTHER): Payer: Medicare HMO | Admitting: Psychiatry

## 2015-02-22 ENCOUNTER — Encounter (HOSPITAL_COMMUNITY): Payer: Self-pay | Admitting: Psychiatry

## 2015-02-22 DIAGNOSIS — F3132 Bipolar disorder, current episode depressed, moderate: Secondary | ICD-10-CM

## 2015-02-22 DIAGNOSIS — M797 Fibromyalgia: Secondary | ICD-10-CM

## 2015-02-22 DIAGNOSIS — F411 Generalized anxiety disorder: Secondary | ICD-10-CM | POA: Diagnosis not present

## 2015-02-22 MED ORDER — LAMOTRIGINE 25 MG PO TABS: 25.0000 mg | ORAL_TABLET | Freq: Every day | ORAL | Status: DC

## 2015-02-22 NOTE — Progress Notes (Signed)
Patient ID: Latoya Wilson, female   DOB: February 02, 1961, 54 y.o.   MRN: [id number]  Psychiatric Outpatient Follow up visit Patient Identification: Latoya Wilson MRN:  [id number] Date of Evaluation:  02/22/2015 Referral Source: [facility] Dura office Chief Complaint:   Chief Complaint    Follow-up     Visit Diagnosis:    ICD-9-CM ICD-10-CM   1. Bipolar affective disorder, currently depressed, moderate ([place]) 296.52 F31.32   2. GAD (generalized anxiety disorder) 300.02 F41.1   3. Fibromyalgia 729.1 M79.7    Diagnosis:   Patient Active Problem List   Diagnosis Date Noted  . Tobacco use disorder [F17.200] 02/18/2015  . Metrorrhagia [N92.1] 05/05/2014  . Pure hypercholesterolemia [E78.00] 05/05/2014  . CONTACT DERMATITIS [L25.9] 05/13/2010  . Bipolar disorder ([place]) [F31.9] 03/30/2010  . LACERATION OF FINGER [S61.209A] 03/22/2010  . BACK PAIN, LUMBAR, WITH RADICULOPATHY [IMO0002] 01/21/2010  . Essential hypertension [I10] 01/10/2010  . SINUSITIS, CHRONIC [J32.9] 01/07/2010  . PLANTAR WART, RIGHT [B07.0] 10/25/2009  . MEMORY LOSS [R41.3] 06/09/2009  . RESTLESS LEGS SYNDROME [G25.81] 03/03/2009  . 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 BRONCHITIS [J40] 02/24/2009  . NEVUS [D23.9] 03/18/2008  . ABSCESS, TOOTH [K04.7] 12/27/2007  . LESION, VAGINA [D49.59] 06/28/2007  . TENDINITIS [M77.9] 04/19/2007  . Human immunodeficiency virus (HIV) disease ([place]) [B20] 04/11/2007  . ANEMIA, VITAMIN B12 DEFICIENCY [D51.8] 12/13/2006  . HX, PERSONAL, TOBACCO USE [Z87.891] 12/13/2006  . MUSCLE PAIN [IMO0001] 09/10/2006  . 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 ASTHMA [J45.909] 07/24/2006   History of Present Illness:  Patient is a 54 years old currently single Caucasian female who is living with her daughter. She is diagnosed with fibromyalgia and depression possible bipolar,  currently on disability because of fibromyalgia and nerve damage in the past that has affected her memory. She  was following with HRA behavioural services at [facility] but she wanted to change to provide at this clinic.  Patient endorsed having highs and lows. . She takes Lyrica for her fibromyalgia she takes Effexor for depression and anxiety. Last visit we added lamictal 25 to 50mg  that has helped her depression. She is feeling less blah altought still amotivated. Has cut down on marijuana use.  Her depression started 2006 when she was mistakenly diagnosed with multiple sclerosis and she was given injections after that she suffered from or memory loss. She has suffered from carpal tunnel fibromyalgia and since then she has been getting treatment for multiple medical conditions.  Aggravating factor; her medical complexity, fibromyalgia, neuropathy, finances. Her daughter is 81 years of age transferred gender. Modifying factors; her kids, her mom. Location: depression and mood swings. Anxiety and tiredness Qualtity: building up for last 3 months is depression. Now moderate Severity: 6/10. 10 being no depression  Marijuana use: weekly for pain Alcohol use: denies Associated symptoms:   Excessive daytime sleepiness. There is no associated psychotic symptoms or paranoia.   (Hypo) Manic Symptoms:  Distractibility, Anxiety Symptoms:  Excessive Worry, Psychotic Symptoms:  denies PTSD Symptoms: NA  Past Psychiatric History: Mostly outpatient treatment since age 54 he started with anxiety and then mood symptoms including depression she has been on different medications in the past. No prior psychiatric admission or suicide attempt. Past Medical History:  Past Medical History  Diagnosis Date  . HIV infection ([place])   . Hypertension   . Hyperlipidemia   . Bipolar affective disorder ([place])   . Restless leg syndrome   . Neuropathy ([place])   . Seizures ([place])   . Sleep  apnea   . Narcolepsy     Past Surgical History  Procedure Laterality Date  . Shoulder surgery     Family History:  Family History   Problem Relation Age of Onset  . Mitral valve prolapse Mother   . Hyperlipidemia Mother   . Heart disease Father     MI died at 6  . Bipolar disorder Sister   . Depression Sister   . Depression Brother    Social History:   Social History   Social History  . Marital Status: Divorced    Spouse Name: N/A  . Number of Children: N/A  . Years of Education: N/A   Social History Main Topics  . Smoking status: Light Tobacco Smoker -- 0.50 packs/day for 35 years    Types: Cigarettes  . Smokeless tobacco: Never Used  . Alcohol Use: No  . Drug Use: 1.00 per week    Special: Marijuana  . Sexual Activity:    Partners: Male    Birth Control/ Protection: Condom     Comment: pt. given condoms   Other Topics Concern  . None   Social History Narrative    Musculoskeletal: Strength & Muscle Tone: within normal limits Gait & Station: normal Patient leans: Front  Psychiatric Specialty Exam: HPI  Review of Systems  Constitutional: Positive for malaise/fatigue. Negative for fever.  Cardiovascular: Negative for chest pain.  Musculoskeletal: Positive for myalgias.  Neurological: Negative for tremors and headaches.  Psychiatric/Behavioral: Negative for suicidal ideas. The patient is nervous/anxious.     There were no vitals taken for this visit.There is no weight on file to calculate BMI.  General Appearance: Casual  Eye Contact:  Fair  Speech:  Slow  Volume:  Normal  Mood:  Less dysphoric  Affect:  Constricted  Thought Process:  Coherent  Orientation:  Full (Time, Place, and Person)  Thought Content:  Rumination  Suicidal Thoughts:  No  Homicidal Thoughts:  No  Memory:  Immediate;   Fair Recent;   Fair  Judgement:  Fair  Insight:  Shallow  Psychomotor Activity:  Normal  Concentration:  Fair  Recall:  Poor  Fund of Knowledge:Fair  Language: Fair  Akathisia:  Negative  Handed:  Right  AIMS (if indicated):    Assets:  Desire for Improvement  ADL's:  Intact  Cognition:  Impaired,  Mild  Sleep:  Excessive tiredness    Is the patient at risk to self?  No. Has the patient been a risk to self in the past 6 months?  No. Has the patient been a risk to self within the distant past?  No. Is the patient a risk to others?  No.   Allergies:   Allergies  Allergen Reactions  . Oxycodone Other (See Comments)    Photosensitivity and nausea and vomiting seizures   Current Medications: Current Outpatient Prescriptions  Medication Sig Dispense Refill  . albuterol (PROVENTIL) (2.5 MG/3ML) 0.083% nebulizer solution Take 2.5 mg by nebulization every 6 (six) hours as needed for wheezing or shortness of breath.    Marland Kitchen amLODipine (NORVASC) 10 MG tablet Take 10 mg by mouth daily.    Marland Kitchen aspirin 81 MG tablet Take 81 mg by mouth daily.    Marland Kitchen atazanavir-cobicistat (EVOTAZ) 300-150 MG tablet Take 1 tablet by mouth daily. Swallow whole. Do NOT crush, cut or chew tablet. Take with food. 90 tablet 3  . beclomethasone (QVAR) 80 MCG/ACT inhaler Inhale 2 puffs into the lungs as needed.    . clindamycin (CLEOCIN) 300  MG capsule     . cyanocobalamin (,VITAMIN B-12,) 1000 MCG/ML injection Inject 1,000 mcg into the muscle every 30 (thirty) days. Around the 5th of every month    . emtricitabine-tenofovir AF (DESCOVY) 200-25 MG tablet Take 1 tablet by mouth daily. 90 tablet 3  . lamoTRIgine (LAMICTAL) 25 MG tablet Take 1 tablet (25 mg total) by mouth daily. Take 3 a day 90 tablet 0  . modafinil (PROVIGIL) 200 MG tablet Take 200 mg by mouth.    . pregabalin (LYRICA) 200 MG capsule Take 200 mg by mouth.    . tretinoin (RETIN-A) 0.025 % cream Apply topically at bedtime.    . varenicline (CHANTIX) 0.5 MG tablet Take 0.5 mg by mouth 2 (two) times daily. Unsure of dose    . venlafaxine XR (EFFEXOR-XR) 75 MG 24 hr capsule Take 225 mg by mouth.      No current facility-administered medications for this visit.    Previous Psychotropic Medications: Yes  Depakote. ativan  Substance Abuse History  in the last 12 months:  Yes.    Marijuana weekly.   Consequences of Substance Abuse: Medical Consequences:  effecting health  Medical Decision Making:  Review of Psycho-Social Stressors (1), Decision to obtain old records (1), Established Problem, Worsening (2) and Review of New Medication or Change in Dosage (2)  Treatment Plan Summary: Medication management and Plan as follows  Bipolar:  Increase lamictal 75mg . Has shown some improvement we can increase dose now.  No rash.  Anxiety: continue to work on sleep and coping skills. May add SSRi if needed Fibromyalgia: patient on lyrica Medical complexity: Neuropathy, fibromyalgia, other medical condition may be contributing to her depression and mood symptoms she is to follow with her primary care physician. Otherwise to rule out hypothyroidism and also get her sleep apnea treated. Marijuana use: patient need to abstain from use Patient not interested in therapy Reviewed sleep hygiene. Patient is a smoker light smoker is wanted cut down but is not ready to quit counseling done More than 50% spent in counseling and coordination including patient education Call 911 or report to local emergency room for any urgent concerns or suicidal thoughts Follow-up in 4 weeks or earlier if needed Time spent: 25 minutes   Prakash Kimberling 11/21/201610:26 AM

## 2015-03-31 ENCOUNTER — Encounter (HOSPITAL_COMMUNITY): Payer: Self-pay | Admitting: Psychiatry

## 2015-03-31 ENCOUNTER — Ambulatory Visit (INDEPENDENT_AMBULATORY_CARE_PROVIDER_SITE_OTHER): Payer: Medicare HMO | Admitting: Psychiatry

## 2015-03-31 VITALS — BP 126/72 | HR 97 | Ht 65.0 in | Wt 164.0 lb

## 2015-03-31 DIAGNOSIS — F411 Generalized anxiety disorder: Secondary | ICD-10-CM | POA: Diagnosis not present

## 2015-03-31 DIAGNOSIS — M797 Fibromyalgia: Secondary | ICD-10-CM

## 2015-03-31 DIAGNOSIS — F3132 Bipolar disorder, current episode depressed, moderate: Secondary | ICD-10-CM

## 2015-03-31 MED ORDER — LAMOTRIGINE 100 MG PO TABS: 100.0000 mg | ORAL_TABLET | Freq: Every day | ORAL | Status: DC

## 2015-03-31 MED ORDER — VENLAFAXINE HCL ER 75 MG PO CP24: 225.0000 mg | ORAL_CAPSULE | Freq: Every day | ORAL | Status: DC

## 2015-03-31 NOTE — Progress Notes (Signed)
Patient ID: Latoya Wilson, female   DOB: 1960-06-08, 54 y.o.   MRN: [id number]  Psychiatric Outpatient Follow up visit Patient Identification: Latoya Wilson MRN:  [id number] Date of Evaluation:  03/31/2015 Referral Source: [facility] office Chief Complaint:   Chief Complaint    Follow-up     Visit Diagnosis:    ICD-9-CM ICD-10-CM   1. Bipolar affective disorder, currently depressed, moderate ([place]) 296.52 F31.32   2. GAD (generalized anxiety disorder) 300.02 F41.1   3. Fibromyalgia 729.1 M79.7    Diagnosis:   Patient Active Problem List   Diagnosis Date Noted  . Tobacco use disorder [F17.200] 02/18/2015  . Metrorrhagia [N92.1] 05/05/2014  . Pure hypercholesterolemia [E78.00] 05/05/2014  . CONTACT DERMATITIS [L25.9] 05/13/2010  . Bipolar disorder ([place]) [F31.9] 03/30/2010  . LACERATION OF FINGER [S61.209A] 03/22/2010  . BACK PAIN, LUMBAR, WITH RADICULOPATHY [IMO0002] 01/21/2010  . Essential hypertension [I10] 01/10/2010  . SINUSITIS, CHRONIC [J32.9] 01/07/2010  . PLANTAR WART, RIGHT [B07.0] 10/25/2009  . MEMORY LOSS [R41.3] 06/09/2009  . RESTLESS LEGS SYNDROME [G25.81] 03/03/2009  . 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 BRONCHITIS [J40] 02/24/2009  . NEVUS [D23.9] 03/18/2008  . ABSCESS, TOOTH [K04.7] 12/27/2007  . LESION, VAGINA [D49.59] 06/28/2007  . TENDINITIS [M77.9] 04/19/2007  . Human immunodeficiency virus (HIV) disease ([place]) [B20] 04/11/2007  . ANEMIA, VITAMIN B12 DEFICIENCY [D51.8] 12/13/2006  . HX, PERSONAL, TOBACCO USE [Z87.891] 12/13/2006  . MUSCLE PAIN [IMO0001] 09/10/2006  . 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 ASTHMA [J45.909] 07/24/2006   History of Present Illness:  Patient is a 54 years old currently single Caucasian female  diagnosed with fibromyalgia and depression possible bipolar,   on disability because of fibromyalgia and nerve damage in the past that has affected her memory. She was following with HRA behavioural services at  [facility] but she wanted to change to provide at this clinic which led to her initial referal.   Last visit increase Lamictal to 75 mg she has been doing somewhat better regarding her mood. Her stressors include her son's and conflicts with them. Marijuana use nonsignificant as of now she has been using it in the past but now she is cutting it down significantly Her depression started 2006 when she was mistakenly diagnosed with multiple sclerosis and she was given injections after that she suffered from or memory loss. She has suffered from carpal tunnel fibromyalgia and since then she has been getting treatment for multiple medical conditions.  Aggravating factor; her medical complexity, fibromyalgia, neuropathy, finances. Her daughter is 93 years of age transferred gender. Modifying factors; her kids, her mom. Location: depression and mood swings. Anxiety and tiredness Qualtity: flucttuates . It got worse in 2016. Now moderate Severity: 6/10. 10 being no depression  Marijuana use: weekly for pain Alcohol use: denies Associated symptoms:   Excessive daytime sleepiness. There is no associated psychotic symptoms or paranoia.   (Hypo) Manic Symptoms:  Distractibility, Anxiety Symptoms:  Excessive Worry, Psychotic Symptoms:  denies PTSD Symptoms: NA  Past Psychiatric History: Mostly outpatient treatment since age 39 he started with anxiety and then mood symptoms including depression she has been on different medications in the past. No prior psychiatric admission or suicide attempt. Past Medical History:  Past Medical History  Diagnosis Date  . HIV infection ([place])   . Hypertension   . Hyperlipidemia   . Bipolar affective disorder ([place])   . Restless leg syndrome   . Neuropathy ([place])   . Seizures ([place])   . Sleep apnea   .  Narcolepsy     Past Surgical History  Procedure Laterality Date  . Shoulder surgery     Family History:  Family History  Problem Relation Age of Onset  .  Mitral valve prolapse Mother   . Hyperlipidemia Mother   . Heart disease Father     MI died at 83  . Bipolar disorder Sister   . Depression Sister   . Depression Brother    Social History:   Social History   Social History  . Marital Status: Divorced    Spouse Name: N/A  . Number of Children: N/A  . Years of Education: N/A   Social History Main Topics  . Smoking status: Light Tobacco Smoker -- 0.50 packs/day for 35 years    Types: Cigarettes  . Smokeless tobacco: Never Used  . Alcohol Use: No  . Drug Use: 1.00 per week    Special: Marijuana  . Sexual Activity:    Partners: Male    Birth Control/ Protection: Condom     Comment: pt. given condoms   Other Topics Concern  . None   Social History Narrative    Musculoskeletal: Strength & Muscle Tone: within normal limits Gait & Station: normal Patient leans: Front  Psychiatric Specialty Exam: HPI  Review of Systems  Constitutional: Positive for malaise/fatigue. Negative for fever.  Cardiovascular: Negative for chest pain.  Musculoskeletal: Positive for myalgias.  Skin: Negative for rash.  Neurological: Negative for tremors and headaches.  Psychiatric/Behavioral: Negative for suicidal ideas. The patient is nervous/anxious.     Blood pressure 126/72, pulse 97, height 5\' 5"  (1.651 m), weight 164 lb (74.39 kg), SpO2 96 %.Body mass index is 27.29 kg/(m^2).  General Appearance: Casual  Eye Contact:  Fair  Speech:  Slow  Volume:  Normal  Mood:  Less dysphoric  Affect:  Constricted  Thought Process:  Coherent  Orientation:  Full (Time, Place, and Person)  Thought Content:  Rumination  Suicidal Thoughts:  No  Homicidal Thoughts:  No  Memory:  Immediate;   Fair Recent;   Fair  Judgement:  Fair  Insight:  Shallow  Psychomotor Activity:  Normal  Concentration:  Fair  Recall:  Poor  Fund of Knowledge:Fair  Language: Fair  Akathisia:  Negative  Handed:  Right  AIMS (if indicated):    Assets:  Desire for  Improvement  ADL's:  Intact  Cognition: Impaired,  Mild  Sleep:  Excessive tiredness    Is the patient at risk to self?  No. Has the patient been a risk to self in the past 6 months?  No. Has the patient been a risk to self within the distant past?  No. Is the patient a risk to others?  No.   Allergies:   Allergies  Allergen Reactions  . Oxycodone Other (See Comments)    Photosensitivity and nausea and vomiting seizures   Current Medications: Current Outpatient Prescriptions  Medication Sig Dispense Refill  . albuterol (PROVENTIL) (2.5 MG/3ML) 0.083% nebulizer solution Take 2.5 mg by nebulization every 6 (six) hours as needed for wheezing or shortness of breath.    Marland Kitchen amLODipine (NORVASC) 10 MG tablet Take 10 mg by mouth daily.    Marland Kitchen atazanavir-cobicistat (EVOTAZ) 300-150 MG tablet Take 1 tablet by mouth daily. Swallow whole. Do NOT crush, cut or chew tablet. Take with food. 90 tablet 3  . beclomethasone (QVAR) 80 MCG/ACT inhaler Inhale 2 puffs into the lungs as needed.    . clindamycin (CLEOCIN) 300 MG capsule     .  cyanocobalamin (,VITAMIN B-12,) 1000 MCG/ML injection Inject 1,000 mcg into the muscle every 30 (thirty) days. Around the 5th of every month    . emtricitabine-tenofovir AF (DESCOVY) 200-25 MG tablet Take 1 tablet by mouth daily. 90 tablet 3  . lamoTRIgine (LAMICTAL) 100 MG tablet Take 1 tablet (100 mg total) by mouth daily. Take one a day 30 tablet 1  . modafinil (PROVIGIL) 200 MG tablet Take 200 mg by mouth.    . pregabalin (LYRICA) 200 MG capsule Take 200 mg by mouth.    . tretinoin (RETIN-A) 0.025 % cream Apply topically at bedtime.    . varenicline (CHANTIX) 0.5 MG tablet Take 0.5 mg by mouth 2 (two) times daily. Unsure of dose    . venlafaxine XR (EFFEXOR-XR) 75 MG 24 hr capsule Take 3 capsules (225 mg total) by mouth daily with breakfast. 90 capsule 1  . aspirin 81 MG tablet Take 81 mg by mouth daily. Reported on 03/31/2015     No current facility-administered  medications for this visit.    Previous Psychotropic Medications: Yes  Depakote. ativan  Substance Abuse History in the last 12 months:  Yes.    Marijuana weekly.   Consequences of Substance Abuse: Medical Consequences:  effecting health  Medical Decision Making:  Review of Psycho-Social Stressors (1), Decision to obtain old records (1), Established Problem, Worsening (2) and Review of New Medication or Change in Dosage (2)  Treatment Plan Summary: Medication management and Plan as follows  Bipolar:  Increase lamictal 100mg . Has shown some improvement we can increase dose now.  No rash.  Anxiety: continue to work on sleep and coping skills. Will renew effexor 225mg . Initially being prescribed by primary care.  Fibromyalgia: patient on lyrica Medical complexity: Neuropathy, fibromyalgia, other medical condition may be contributing to her depression and mood symptoms she is to follow with her primary care physician. Otherwise to rule out hypothyroidism and also get her sleep apnea treated. Marijuana use: patient need to abstain from use Patient is now interested in therapy to deal with her son and conflicts. Reviewed sleep hygiene. Patient is a smoker light smoker is wanted cut down but is not ready to quit counseling done More than 50% spent in counseling and coordination including patient education Call 911 or report to local emergency room for any urgent concerns or suicidal thoughts Follow-up in 4 to 6  weeks or earlier if needed Time spent: 25 minutes   Tariana Moldovan 12/28/201610:03 AM

## 2015-04-12 ENCOUNTER — Ambulatory Visit (HOSPITAL_COMMUNITY): Payer: Self-pay | Admitting: Licensed Clinical Social Worker

## 2015-04-15 ENCOUNTER — Encounter (HOSPITAL_COMMUNITY): Payer: Self-pay | Admitting: Licensed Clinical Social Worker

## 2015-04-15 ENCOUNTER — Ambulatory Visit (INDEPENDENT_AMBULATORY_CARE_PROVIDER_SITE_OTHER): Payer: Medicare HMO | Admitting: Licensed Clinical Social Worker

## 2015-04-15 DIAGNOSIS — F3132 Bipolar disorder, current episode depressed, moderate: Secondary | ICD-10-CM | POA: Diagnosis not present

## 2015-04-15 DIAGNOSIS — F410 Panic disorder [episodic paroxysmal anxiety] without agoraphobia: Secondary | ICD-10-CM | POA: Diagnosis not present
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
## 2015-05-03 ENCOUNTER — Ambulatory Visit (INDEPENDENT_AMBULATORY_CARE_PROVIDER_SITE_OTHER): Payer: Medicare HMO | Admitting: Licensed Clinical Social Worker

## 2015-05-03 DIAGNOSIS — F3132 Bipolar disorder, current episode depressed, moderate: Secondary | ICD-10-CM | POA: Diagnosis not present

## 2015-05-03 DIAGNOSIS — F410 Panic disorder [episodic paroxysmal anxiety] without agoraphobia: Secondary | ICD-10-CM | POA: Diagnosis not present

## 2015-05-03 NOTE — Progress Notes (Signed)
   THERAPIST PROGRESS NOTE  Session Time: 1:05pm-2:00pm  Participation Level: Active  Behavioral Response: DisheveledAlertAnxious  Type of Therapy: Individual Therapy  Treatment Goals addressed: Developed treatment plan today  Interventions: Treatment planning, psycho-education about anxiety  Suicidal/Homicidal: Denied both  Therapist Response: Collaborated with patient to develop her treatment plan.   Educated patient about the fight or flight response and how it becomes activated whenever you have a thought that there could be some kind of threat.  Explained that it doesn't matter whether the threat is real or not.  Emphasized that symptoms of a panic attack are not dangerous.  Encouraged patient to remind herself of this when she has symptoms.        Summary: Indicated that she would like to focus on reducing frequency and intensity of panic attacks.  Talked about how anxiety gets in the way of her accomplishing tasks.   Indicated she appreciated therapist taking time to explain fight or flight saying "No one has ever explained these things to me before.  I think this is going to help."       Plan: Scheduled to return next week.  Diagnosis: Panic Disorder                         Bipolar Disorder current episode depressed    Armandina Stammer 05/03/2015

## 2015-05-10 ENCOUNTER — Ambulatory Visit (HOSPITAL_COMMUNITY): Payer: Medicare HMO | Admitting: Licensed Clinical Social Worker

## 2015-05-18 ENCOUNTER — Ambulatory Visit (INDEPENDENT_AMBULATORY_CARE_PROVIDER_SITE_OTHER): Payer: Medicare HMO | Admitting: Licensed Clinical Social Worker

## 2015-05-18 DIAGNOSIS — F411 Generalized anxiety disorder: Secondary | ICD-10-CM

## 2015-05-18 DIAGNOSIS — F410 Panic disorder [episodic paroxysmal anxiety] without agoraphobia: Secondary | ICD-10-CM

## 2015-05-18 DIAGNOSIS — F3132 Bipolar disorder, current episode depressed, moderate: Secondary | ICD-10-CM | POA: Diagnosis not present

## 2015-05-18 NOTE — Progress Notes (Signed)
   THERAPIST PROGRESS NOTE  Session Time: 2:15pm-3:00pm  Participation Level: Active  Behavioral Response: DisheveledAlertAnxious  Type of Therapy: Individual Therapy  Treatment Goals addressed: Decrease degree to which panic attacks are controlling her life  Interventions: CBT, relaxation  Suicidal/Homicidal: Denied both  Therapist Response: Reviewed concepts from last session about fight or flight.   Talked about how it is our thoughts about a situation that determine how we end up feeling rather than the situation itself.  Also talked about how we have choices when it comes to acting in response to our feelings.   Taught patient a breathing exercise.  Listened to a recording so she could practice.  Recommended doing a search online for breathing exercises or mindfulness meditation.  Assigned homework to set aside at least 10 minutes each day to practice deep breathing.             Summary:  Reported she has been feeling less depressed, but she has continued to feel overwhelmed and anxious.  Having panic attacks a few times a day. Seemed to understand the concepts discussed today.  Cooperative about practcing the breathing exercise.  Noted that it did seem to help her feel more relaxed.  Agreed to do the homework.        Plan: Scheduled to return 06/09/15.  Will check on practice of focused breathing.    Diagnosis: Panic Disorder                         Bipolar Disorder current episode depressed    Latoya Wilson 05/18/2015

## 2015-05-20 ENCOUNTER — Other Ambulatory Visit: Payer: Medicare HMO

## 2015-05-20 DIAGNOSIS — Z79899 Other long term (current) drug therapy: Secondary | ICD-10-CM

## 2015-05-20 DIAGNOSIS — Z113 Encounter for screening for infections with a predominantly sexual mode of transmission: Secondary | ICD-10-CM

## 2015-05-20 DIAGNOSIS — B20 Human immunodeficiency virus [HIV] disease: Secondary | ICD-10-CM

## 2015-05-20 LAB — COMPLETE METABOLIC PANEL WITH GFR
ALBUMIN: 4.3 g/dL (ref 3.6–5.1)
ALK PHOS: 144 U/L — AB (ref 33–130)
ALT: 27 U/L (ref 6–29)
AST: 26 U/L (ref 10–35)
BILIRUBIN TOTAL: 1.6 mg/dL — AB (ref 0.2–1.2)
BUN: 19 mg/dL (ref 7–25)
CO2: 23 mmol/L (ref 20–31)
CREATININE: 0.98 mg/dL (ref 0.50–1.05)
Calcium: 9.1 mg/dL (ref 8.6–10.4)
Chloride: 104 mmol/L (ref 98–110)
GFR, EST NON AFRICAN AMERICAN: 65 mL/min (ref >=60)
GFR, Est African American: 75 mL/min (ref >=60)
GLUCOSE: 114 mg/dL — AB (ref 65–99)
Potassium: 3.7 mmol/L (ref 3.5–5.3)
SODIUM: 137 mmol/L (ref 135–146)
TOTAL PROTEIN: 6.9 g/dL (ref 6.1–8.1)

## 2015-05-20 LAB — LIPID PANEL
Cholesterol: 306 mg/dL — ABNORMAL HIGH (ref 125–200)
HDL: 62 mg/dL (ref >=46)
LDL Cholesterol: 225 mg/dL — ABNORMAL HIGH (ref <130)
Total CHOL/HDL Ratio: 4.9 Ratio (ref <=5.0)
Triglycerides: 96 mg/dL (ref <150)
VLDL: 19 mg/dL (ref <30)

## 2015-05-20 LAB — CBC
HCT: 44.6 % (ref 36.0–46.0)
Hemoglobin: 15 g/dL (ref 12.0–15.0)
MCH: 30.6 pg (ref 26.0–34.0)
MCHC: 33.6 g/dL (ref 30.0–36.0)
MCV: 91 fL (ref 78.0–100.0)
MPV: 10.7 fL (ref 8.6–12.4)
PLATELETS: 237 10*3/uL (ref 150–400)
RBC: 4.9 MIL/uL (ref 3.87–5.11)
RDW: 13.2 % (ref 11.5–15.5)
WBC: 4.8 10*3/uL (ref 4.0–10.5)

## 2015-05-21 LAB — T-HELPER CELL (CD4) - (RCID CLINIC ONLY)
CD4 T CELL ABS: 490 /uL (ref 400–2700)
CD4 T CELL HELPER: 41 % (ref 33–55)

## 2015-05-21 LAB — HIV-1 RNA QUANT-NO REFLEX-BLD

## 2015-05-21 LAB — RPR

## 2015-06-01 ENCOUNTER — Ambulatory Visit (HOSPITAL_COMMUNITY): Payer: Self-pay | Admitting: Psychiatry

## 2015-06-03 ENCOUNTER — Ambulatory Visit (INDEPENDENT_AMBULATORY_CARE_PROVIDER_SITE_OTHER): Payer: Medicare HMO | Admitting: Psychiatry

## 2015-06-03 ENCOUNTER — Encounter (HOSPITAL_COMMUNITY): Payer: Self-pay | Admitting: Psychiatry

## 2015-06-03 VITALS — BP 114/64 | HR 91 | Ht 65.0 in | Wt 174.0 lb

## 2015-06-03 DIAGNOSIS — F411 Generalized anxiety disorder: Secondary | ICD-10-CM | POA: Diagnosis not present

## 2015-06-03 DIAGNOSIS — F3132 Bipolar disorder, current episode depressed, moderate: Secondary | ICD-10-CM | POA: Diagnosis not present

## 2015-06-03 DIAGNOSIS — M797 Fibromyalgia: Secondary | ICD-10-CM | POA: Diagnosis not present

## 2015-06-03 MED ORDER — LAMOTRIGINE 100 MG PO TABS: 100.0000 mg | ORAL_TABLET | Freq: Every day | ORAL | Status: DC

## 2015-06-03 MED ORDER — VENLAFAXINE HCL ER 75 MG PO CP24
225.0000 mg | ORAL_CAPSULE | Freq: Every day | ORAL | Status: DC
Start: 2015-06-03 — End: 2015-08-12

## 2015-06-03 NOTE — Progress Notes (Signed)
Patient ID: Latoya Wilson, female   DOB: 1960/12/29, 55 y.o.   MRN: [id number]  Psychiatric Outpatient Follow up visit Patient Identification: Latoya Wilson MRN:  [id number] Date of Evaluation:  06/03/2015 Referral Source: [facility] office Chief Complaint:   Chief Complaint    Follow-up     Visit Diagnosis:    ICD-9-CM ICD-10-CM   1. Bipolar affective disorder, currently depressed, moderate ([place]) 296.52 F31.32   2. GAD (generalized anxiety disorder) 300.02 F41.1   3. Fibromyalgia 729.1 M79.7    Diagnosis:   Patient Active Problem List   Diagnosis Date Noted  . Panic disorder [F41.0] 04/15/2015  . Bipolar affective disorder, currently depressed, moderate ([place]) [F31.32] 04/15/2015  . Tobacco use disorder [F17.200] 02/18/2015  . Metrorrhagia [N92.1] 05/05/2014  . Pure hypercholesterolemia [E78.00] 05/05/2014  . Fibrositis [M79.7] 07/10/2012  . Apnea, sleep [G47.30] 04/04/2012  . CONTACT DERMATITIS [L25.9] 05/13/2010  . Bipolar disorder ([place]) [F31.9] 03/30/2010  . LACERATION OF FINGER [S61.209A] 03/22/2010  . BACK PAIN, LUMBAR, WITH RADICULOPATHY [IMO0002] 01/21/2010  . Essential hypertension [I10] 01/10/2010  . SINUSITIS, CHRONIC [J32.9] 01/07/2010  . PLANTAR WART, RIGHT [B07.0] 10/25/2009  . MEMORY LOSS [R41.3] 06/09/2009  . RESTLESS LEGS SYNDROME [G25.81] 03/03/2009  . 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 BRONCHITIS [J40] 02/24/2009  . NEVUS [D23.9] 03/18/2008  . ABSCESS, TOOTH [K04.7] 12/27/2007  . LESION, VAGINA [D49.59] 06/28/2007  . TENDINITIS [M77.9] 04/19/2007  . Human immunodeficiency virus (HIV) disease ([place]) [B20] 04/11/2007  . ANEMIA, VITAMIN B12 DEFICIENCY [D51.8] 12/13/2006  . HX, PERSONAL, TOBACCO USE [Z87.891] 12/13/2006  . MUSCLE PAIN [IMO0001] 09/10/2006  . 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 ASTHMA [J45.909] 07/24/2006   History of Present Illness:  Patient is a 55 years old currently single Caucasian female  diagnosed with  fibromyalgia and depression possible bipolar,   on disability because of fibromyalgia and nerve damage in the past that has affected her memory. She was following with [facility] behavioural services at [facility] but she wanted to change to provide at this clinic which led to her initial referal. Her depression started 2006 when she was mistakenly diagnosed with multiple sclerosis and she was given injections after that she suffered from or memory loss. She has suffered from carpal tunnel fibromyalgia and since then she has been getting treatment for multiple medical conditions  Her depression is responding to Lamictal that was increased to 100 mg last visit. There is no rash reported she can stick Effexor. She feels she is a different person compared to a few months ago and appears to be more positive . Aggravating factor; her medical complexity, fibromyalgia, neuropathy, finances. Her daughter is 23 years of age transferred gender. Modifying factors; her kids, her mom. Location: depression and mood swings. Anxiety and tiredness Qualtity: flucttuates . It got worse in 2016. Now moderate Severity: 7/10. 10 being no depression  Marijuana use: weekly for pain Alcohol use: denies Associated symptoms:   Excessive daytime sleepiness. There is no associated psychotic symptoms or paranoia.   (Hypo) Manic Symptoms:  Distractibility, Anxiety Symptoms:  Excessive Worry, (improving) Psychotic Symptoms:  denies PTSD Symptoms: NA  Past Medical History:  Past Medical History  Diagnosis Date  . HIV infection ([place])   . Hypertension   . Hyperlipidemia   . Bipolar affective disorder ([place])   . Restless leg syndrome   . Neuropathy ([place])   . Seizures ([place])   . Sleep apnea   . Narcolepsy     Past Surgical History  Procedure  Laterality Date  . Shoulder surgery     Family History:  Family History  Problem Relation Age of Onset  . Mitral valve prolapse Mother   . Hyperlipidemia Mother   . Heart disease  Father     MI died at 47  . Bipolar disorder Sister   . Depression Sister   . Depression Brother    Social History:   Social History   Social History  . Marital Status: Divorced    Spouse Name: N/A  . Number of Children: N/A  . Years of Education: N/A   Social History Main Topics  . Smoking status: Light Tobacco Smoker -- 0.50 packs/day for 35 years    Types: Cigarettes  . Smokeless tobacco: Never Used  . Alcohol Use: No  . Drug Use: 1.00 per week    Special: Marijuana  . Sexual Activity:    Partners: Male    Birth Control/ Protection: Condom     Comment: pt. given condoms   Other Topics Concern  . None   Social History Narrative    Musculoskeletal: Strength & Muscle Tone: within normal limits Gait & Station: normal Patient leans: Front  Psychiatric Specialty Exam: HPI  Review of Systems  Constitutional: Positive for malaise/fatigue. Negative for fever.  Cardiovascular: Negative for chest pain.  Musculoskeletal: Positive for myalgias.  Skin: Negative for rash.  Neurological: Negative for tremors and headaches.  Psychiatric/Behavioral: Negative for depression and suicidal ideas.    Blood pressure 114/64, pulse 91, height 5\' 5"  (1.651 m), weight 174 lb (78.926 kg), SpO2 95 %.Body mass index is 28.96 kg/(m^2).  General Appearance: Casual  Eye Contact:  Fair  Speech:  Slow  Volume:  Normal  Mood:  Euthymic   Affect:  Constricted  Thought Process:  Coherent  Orientation:  Full (Time, Place, and Person)  Thought Content:  Rumination  Suicidal Thoughts:  No  Homicidal Thoughts:  No  Memory:  Immediate;   Fair Recent;   Fair  Judgement:  Fair  Insight:  Shallow  Psychomotor Activity:  Normal  Concentration:  Fair  Recall:  Poor  Fund of Knowledge:Fair  Language: Fair  Akathisia:  Negative  Handed:  Right  AIMS (if indicated):    Assets:  Desire for Improvement  ADL's:  Intact  Cognition: Impaired,  Mild  Sleep:  Excessive tiredness    Is the  patient at risk to self?  No. Has the patient been a risk to self in the past 6 months?  No. Has the patient been a risk to self within the distant past?  No. Is the patient a risk to others?  No.   Allergies:   Allergies  Allergen Reactions  . Oxycodone Other (See Comments)    Photosensitivity and nausea and vomiting seizures   Current Medications: Current Outpatient Prescriptions  Medication Sig Dispense Refill  . albuterol (PROVENTIL) (2.5 MG/3ML) 0.083% nebulizer solution Take 2.5 mg by nebulization every 6 (six) hours as needed for wheezing or shortness of breath.    Marland Kitchen amLODipine (NORVASC) 10 MG tablet Take 10 mg by mouth daily.    Marland Kitchen aspirin 81 MG tablet Take 81 mg by mouth daily. Reported on 03/31/2015    . atazanavir-cobicistat (EVOTAZ) 300-150 MG tablet Take 1 tablet by mouth daily. Swallow whole. Do NOT crush, cut or chew tablet. Take with food. 90 tablet 3  . beclomethasone (QVAR) 80 MCG/ACT inhaler Inhale 2 puffs into the lungs as needed.    . clindamycin (CLEOCIN) 300 MG capsule     .  cyanocobalamin (,VITAMIN B-12,) 1000 MCG/ML injection Inject 1,000 mcg into the muscle every 30 (thirty) days. Around the 5th of every month    . emtricitabine-tenofovir AF (DESCOVY) 200-25 MG tablet Take 1 tablet by mouth daily. 90 tablet 3  . lamoTRIgine (LAMICTAL) 100 MG tablet Take 1 tablet (100 mg total) by mouth daily. Take one a day 30 tablet 1  . modafinil (PROVIGIL) 200 MG tablet Take 200 mg by mouth.    . pregabalin (LYRICA) 200 MG capsule Take 200 mg by mouth.    . tretinoin (RETIN-A) 0.025 % cream Apply topically at bedtime.    . varenicline (CHANTIX) 0.5 MG tablet Take 0.5 mg by mouth 2 (two) times daily. Unsure of dose    . venlafaxine XR (EFFEXOR-XR) 75 MG 24 hr capsule Take 3 capsules (225 mg total) by mouth daily with breakfast. 90 capsule 1   No current facility-administered medications for this visit.    Previous Psychotropic Medications: Yes  Depakote.  ativan  Substance Abuse History in the last 12 months:  Yes.    Marijuana weekly.   Consequences of Substance Abuse: Medical Consequences:  effecting health    Treatment Plan Summary: Medication management and Plan as follows  Bipolar: continue  lamictal 100mg . Has shown some improvement we will continue current dose.  No rash.  Anxiety: continue to work on sleep and coping skills. Will renew effexor 225mg . Initially being prescribed by primary care.  Fibromyalgia: patient on lyrica Medical complexity: Neuropathy, fibromyalgia, other medical condition may contribute to her mood. Otherwise to rule out hypothyroidism and also get her sleep apnea treated. Still working on it.  Marijuana use: patient need to abstain from use Patient is now interested in therapy to deal with her son and conflicts. Reviewed sleep hygiene. Patient is a smoker light smoker is wanted cut down but is not ready to quit counseling done More than 50% spent in counseling and coordination including patient education Call 911 or report to local emergency room for any urgent concerns or suicidal thoughts Follow-up in 8 weeks or earlier if needed Time spent: 25 minutes   Latoya Wilson 3/2/20172:29 PM

## 2015-06-07 ENCOUNTER — Ambulatory Visit (INDEPENDENT_AMBULATORY_CARE_PROVIDER_SITE_OTHER): Payer: Medicare HMO | Admitting: Infectious Diseases

## 2015-06-07 ENCOUNTER — Ambulatory Visit: Payer: Medicare HMO | Admitting: *Deleted

## 2015-06-07 ENCOUNTER — Encounter: Payer: Self-pay | Admitting: Infectious Diseases

## 2015-06-07 VITALS — BP 127/83 | HR 61 | Temp 97.7°F | Wt 174.0 lb

## 2015-06-07 DIAGNOSIS — B20 Human immunodeficiency virus [HIV] disease: Secondary | ICD-10-CM

## 2015-06-07 DIAGNOSIS — F3170 Bipolar disorder, currently in remission, most recent episode unspecified: Secondary | ICD-10-CM

## 2015-06-07 DIAGNOSIS — N921 Excessive and frequent menstruation with irregular cycle: Secondary | ICD-10-CM

## 2015-06-07 DIAGNOSIS — F17200 Nicotine dependence, unspecified, uncomplicated: Secondary | ICD-10-CM | POA: Diagnosis not present

## 2015-06-07 DIAGNOSIS — F3132 Bipolar disorder, current episode depressed, moderate: Secondary | ICD-10-CM | POA: Diagnosis not present

## 2015-06-07 DIAGNOSIS — Z113 Encounter for screening for infections with a predominantly sexual mode of transmission: Secondary | ICD-10-CM

## 2015-06-07 NOTE — Assessment & Plan Note (Signed)
She is doing well Offered prep for her partner (she declines) Given condoms vax are up to date.  rtc in 6 months

## 2015-06-07 NOTE — Progress Notes (Signed)
   Subjective:    Patient ID: Latoya Wilson, female    DOB: 1960/05/07, 55 y.o.   MRN: [id number]  HPI 55 yo F with hx of HIV+, HTN, bipolar d/o. Taking ATVr/TRV --> ATVc/Descovy (2016). Had normal PAP Dec 2014 (previous abn).   HIV 1 RNA QUANT (copies/mL)  Date Value  05/20/2015 <20  01/27/2015 <20  04/20/2014 <20   CD4 T CELL ABS (/uL)  Date Value  05/20/2015 490  01/27/2015 1070  04/20/2014 570   Doing well on her new ART.  Went to see Gyn told she did not need pap. Not sure wher her next f/u appt.  Mammo nl 05-2015 Down to 1/4 ppd. Is going to go back on Chantix.  Still having anxiety attacks "over everything- opening the mail.." tired of feeling like this. Wishes she could be back on lorazepam.   Not exercising or watching diet.  Boyfriend is negative, aware of her status. Has condoms.   Review of Systems  Constitutional: Negative for appetite change and unexpected weight change.  Gastrointestinal: Negative for diarrhea and constipation.  Genitourinary: Positive for menstrual problem. Negative for difficulty urinating.  Psychiatric/Behavioral: The patient is nervous/anxious.   menses irregular.      Objective:   Physical Exam  Constitutional: She appears well-developed and well-nourished.  HENT:  Mouth/Throat: No oropharyngeal exudate.  Eyes: EOM are normal. Pupils are equal, round, and reactive to light.  Neck: Neck supple.  Cardiovascular: Normal rate, regular rhythm and normal heart sounds.   Pulmonary/Chest: Effort normal and breath sounds normal.  Abdominal: Soft. Bowel sounds are normal. There is no tenderness. There is no rebound.  Musculoskeletal: She exhibits no edema.  Lymphadenopathy:    She has no cervical adenopathy.       Assessment & Plan:

## 2015-06-07 NOTE — Assessment & Plan Note (Signed)
She will f/u with Gyn for her PAP

## 2015-06-07 NOTE — Assessment & Plan Note (Signed)
encouraged her to quit.

## 2015-06-07 NOTE — BH Specialist Note (Signed)
Counselor met with Latoya Wilson per request of Dr. Johnnye Sima for observed symptoms of anxiety and panic.  Patient was oriented times four with good affect and dress.  Patient was alert and very talkative.  Patient skipped from subject to subject while sharing with counselor.  Patient indicated that she has resentment towards someone but did not indicate who and that this alone made her an angry person.  Patient complained about several people close to her and outside her main circle that she feels has done her wrong.  Patient stated that she gets anxiety and has panic attacks but does not understand why.  Counselor encouraged patient to seek counseling services to work on copings skills as well as anger management.  Patient agreed and made an appointment for next week. Counselor provided support and encouragement as patient engaged in conversation.   Rolena Infante, MA [facility]

## 2015-06-07 NOTE — Assessment & Plan Note (Signed)
She is doing ok. She has mental health f/u.  She will see Jeral Fruit today

## 2015-06-07 NOTE — Telephone Encounter (Signed)
ERROR

## 2015-06-09 ENCOUNTER — Ambulatory Visit (INDEPENDENT_AMBULATORY_CARE_PROVIDER_SITE_OTHER): Payer: Medicare HMO | Admitting: Licensed Clinical Social Worker

## 2015-06-09 DIAGNOSIS — F410 Panic disorder [episodic paroxysmal anxiety] without agoraphobia: Secondary | ICD-10-CM

## 2015-06-09 DIAGNOSIS — F3132 Bipolar disorder, current episode depressed, moderate: Secondary | ICD-10-CM

## 2015-06-09 NOTE — Progress Notes (Signed)
   THERAPIST PROGRESS NOTE  Session Time: [id number]  Participation Level: Active  Behavioral Response: Casual Alert Euthymic  Type of Therapy: Individual Therapy  Treatment Goals addressed: Decrease degree to which panic attacks are controlling her life  Interventions: Mindfulness   Suicidal/Homicidal: Denied both  Therapist Response: Introduced the concept of mindfulness.  Emphasized how learning to focus on the present can help you to feel more in control of your emotions.  Explained how it can be useful to practice at times when you catch yourself having unhelpful thoughts.  Described how you can choose to do tasks in a mindful way.  Guided patient through practicing mindfulness in different ways including focusing on an object and mindful breathing.  Encouraged patient to practice the skills regularly in addition to times of distress.           Summary: Reported feeling as though she has been making some progress towards improving her functioning.  She has started going to the gym several times a week.  She has been accomplishing more tasks around her house.  She is sleeping well at night and not napping during the day like she had been before.  Continues to have daily panic attacks though. Indicated she understood concepts presented saying, "The stuff you talked about today, it makes sense."  Expressed having intentions of incorporating mindfulness into her day.      Plan:  Scheduled to return next week.  Will check on use of mindfulness skills.   Diagnosis: Panic Disorder                         Bipolar Disorder current episode depressed    Armandina Stammer 06/09/2015

## 2015-06-11 ENCOUNTER — Ambulatory Visit (INDEPENDENT_AMBULATORY_CARE_PROVIDER_SITE_OTHER): Payer: Medicare HMO | Admitting: *Deleted

## 2015-06-11 ENCOUNTER — Other Ambulatory Visit (HOSPITAL_COMMUNITY)
Admission: RE | Admit: 2015-06-11 | Discharge: 2015-06-11 | Disposition: A | Discharge status: Home / Self Care | Payer: Medicare HMO | Source: Ambulatory Visit | Attending: Infectious Diseases | Admitting: Infectious Diseases

## 2015-06-11 DIAGNOSIS — Z124 Encounter for screening for malignant neoplasm of cervix: Secondary | ICD-10-CM

## 2015-06-11 DIAGNOSIS — Z113 Encounter for screening for infections with a predominantly sexual mode of transmission: Secondary | ICD-10-CM

## 2015-06-11 DIAGNOSIS — Z01419 Encounter for gynecological examination (general) (routine) without abnormal findings: Secondary | ICD-10-CM | POA: Insufficient documentation

## 2015-06-11 NOTE — Patient Instructions (Signed)
Your results will be ready in about a week.  I will mail them to you.  Thank you for coming to the Center for your care.  Ellis Koffler,  RN 

## 2015-06-11 NOTE — Progress Notes (Signed)
  Subjective:     Latoya Wilson is a 55 y.o. woman who comes in today for a  pap smear only.  Previous abnormal Pap smears: yes. Contraception: condoms  Objective:    There were no vitals taken for this visit. Pelvic Exam:  Pap smear obtained.   Assessment:    Screening pap smear.   Plan:    Follow up in one year, or as indicated by Pap results.  Pt given educational materials re: HIV and women, self-esteem, BSE, nutrition and diet management, PAP smears and partner safety. Pt given condoms

## 2015-06-14 ENCOUNTER — Ambulatory Visit (INDEPENDENT_AMBULATORY_CARE_PROVIDER_SITE_OTHER): Payer: Medicare HMO | Admitting: Licensed Clinical Social Worker

## 2015-06-14 DIAGNOSIS — F410 Panic disorder [episodic paroxysmal anxiety] without agoraphobia: Secondary | ICD-10-CM

## 2015-06-14 DIAGNOSIS — F411 Generalized anxiety disorder: Secondary | ICD-10-CM

## 2015-06-14 DIAGNOSIS — F3132 Bipolar disorder, current episode depressed, moderate: Secondary | ICD-10-CM

## 2015-06-14 LAB — CERVICOVAGINAL ANCILLARY ONLY
CHLAMYDIA, DNA PROBE: NEGATIVE
Neisseria Gonorrhea: NEGATIVE

## 2015-06-14 LAB — CYTOLOGY - PAP

## 2015-06-14 NOTE — Progress Notes (Signed)
   THERAPIST PROGRESS NOTE  Session Time: 2:10-3:00pm  Participation Level: Active  Behavioral Response: Casual Alert Anxious  Type of Therapy: Individual Therapy  Treatment Goals addressed: Decrease degree to which panic attacks are controlling her life  Interventions: Assertiveness Training  Suicidal/Homicidal: Denied both  Therapist Interventions:  Discussed how patient has started to practice mindfulness as a way to cope with overwhelming thoughts and feelings. Educated patient about differences between passive, aggressive, and assertive communication styles.  Taught patient an assertive communication strategy using 3 Fs: facts, feelings, and a fair request.              Summary:  Reported feeling good about the fact that she was successful cleaning up her kitchen.  Also practicing focused breathing and focusing on a single object.   Talked about how she would like her daughter to help out more with tasks around the house.  Plans to use the skills taught today to request that her daughter clean up the stove after she is done cooking.  Acknowledged that she has a tendency to use a passive communication style and she needs to work on being more assertive.         Plan:  Scheduled to return March 24th.  Will check on implementation of assertiveness skills.    Diagnosis: Panic Disorder                          Generalized Anxiety Disorder                         Bipolar Disorder current episode depressed    Armandina Stammer 06/14/2015
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
## 2015-06-25 ENCOUNTER — Ambulatory Visit (INDEPENDENT_AMBULATORY_CARE_PROVIDER_SITE_OTHER): Payer: Medicare HMO | Admitting: Licensed Clinical Social Worker

## 2015-06-25 ENCOUNTER — Telehealth: Payer: Self-pay | Admitting: *Deleted

## 2015-06-25 DIAGNOSIS — F411 Generalized anxiety disorder: Secondary | ICD-10-CM | POA: Diagnosis not present

## 2015-06-25 DIAGNOSIS — F3132 Bipolar disorder, current episode depressed, moderate: Secondary | ICD-10-CM | POA: Diagnosis not present

## 2015-06-25 DIAGNOSIS — F410 Panic disorder [episodic paroxysmal anxiety] without agoraphobia: Secondary | ICD-10-CM | POA: Diagnosis not present

## 2015-06-25 NOTE — Telephone Encounter (Signed)
[facility] called to confirm patient's contact info and prescriptions.  She is newly spap-approved.  Patient unaware of spap application with RN spoke with her. Spoke with Sharyn Lull. Patient's medicaid is active again, patient does not need spap.  Left message with patient that she can continue to use [place].  Sharyn Lull will notify spap. Landis Gandy, RN

## 2015-06-25 NOTE — Progress Notes (Signed)
   THERAPIST PROGRESS NOTE  Session Time: 2:00pm-3:00pm  Participation Level: Active  Behavioral Response: Casual Alert Anxious and Depressed  Type of Therapy: Individual Therapy  Treatment Goals addressed: Decrease degree to which panic attacks are controlling her life  Interventions: Solution focused  Suicidal/Homicidal: Admitted to some SI without plan or intent in past couple days, but said "I don't want to die."  Denied HI  Therapist Interventions:  Discussed how son's current stressors are affecting patient.  Gathered information about her son and his mental health history.  Discussed how she wants to be a source of support for him, but she knows that her current mental health status is not well.  Discussed options for offering support in a limited capacity.  Encouraged her to focus on self-care.               Summary:  Expressed having major concerns about her adult son.  Earlier in the week he threatened to harm himself and also told her "You're dead to me."  Since then she has been distraught.  She said, "I can't think.  I'm not in my body.  Part of me doesn't care.  I can't handle it."  She shut down.   Reached out to her son's dad for help.  Planning to go see her son with his dad.  Plans to express that she loves and cares about him, but she is in a position to financially help at this time.  Hoping dad will step up and assist son with finding a different place to live.       Plan:   Scheduled to return in 2 weeks.  Going to check and see if therapist has any appointments available next week though.  Diagnosis: Panic Disorder                          Generalized Anxiety Disorder                         Bipolar Disorder current episode depressed    Armandina Stammer 06/25/2015

## 2015-07-02 ENCOUNTER — Encounter: Payer: Self-pay | Admitting: *Deleted

## 2015-07-08 ENCOUNTER — Ambulatory Visit (INDEPENDENT_AMBULATORY_CARE_PROVIDER_SITE_OTHER): Payer: Medicare HMO | Admitting: Licensed Clinical Social Worker

## 2015-07-08 DIAGNOSIS — F411 Generalized anxiety disorder: Secondary | ICD-10-CM | POA: Diagnosis not present

## 2015-07-08 DIAGNOSIS — F3132 Bipolar disorder, current episode depressed, moderate: Secondary | ICD-10-CM

## 2015-07-08 DIAGNOSIS — F410 Panic disorder [episodic paroxysmal anxiety] without agoraphobia: Secondary | ICD-10-CM

## 2015-07-08 NOTE — Progress Notes (Signed)
   THERAPIST PROGRESS NOTE  Session Time: [id number]  Participation Level: Active  Behavioral Response: Casual Alert Euthymic  Type of Therapy: Individual Therapy  Treatment Goals addressed: Decrease degree to which panic attacks are controlling her life  Interventions: Assertiveness training  Suicidal/Homicidal: Denied both  Therapist Interventions:   Checked on patient's continued use of coping skills she has been taught in previous sessions. Revisited the skill of developing an assertiveness script.  Assisted patient with defining the problem and identifying facts, feelings, and a fair request to communicate to her boyfriend.                Summary:  Reported "I'm doing pretty good."  Noted feeling more relaxed knowing her son is doing better.  Reported that meeting with him had gone well last week.  He is now living with his sister. Indicated that she has been using many of the coping skills she has been taught including focused breathing and mindfulness.  Also described making extra efforts to make better health choices.  Reported going to the gym and using the treadmill, eating healthier foods, drinking less soda, and spending more time outdoors doing gardening.   Expressed a desire to communicate to her boyfriend that there are times when she resents doing tasks for him, especially considering they are not married.  Thanked therapist for helping her to figure out what she wants to say in a respectful manner.         Plan:   Scheduled to return next week.  Will check to see if patient followed through with communicating concerns to her boyfriend.  Diagnosis: Panic Disorder                          Generalized Anxiety Disorder                         Bipolar Disorder current episode depressed    Armandina Stammer 07/08/2015

## 2015-07-09 ENCOUNTER — Encounter: Payer: Self-pay | Admitting: Infectious Diseases

## 2015-07-09 ENCOUNTER — Ambulatory Visit (HOSPITAL_COMMUNITY): Payer: Self-pay | Admitting: Licensed Clinical Social Worker

## 2015-07-15 ENCOUNTER — Ambulatory Visit: Payer: Self-pay | Admitting: *Deleted

## 2015-07-15 ENCOUNTER — Ambulatory Visit (INDEPENDENT_AMBULATORY_CARE_PROVIDER_SITE_OTHER): Payer: Medicare HMO | Admitting: Licensed Clinical Social Worker

## 2015-07-15 DIAGNOSIS — F3132 Bipolar disorder, current episode depressed, moderate: Secondary | ICD-10-CM | POA: Diagnosis not present

## 2015-07-15 DIAGNOSIS — F410 Panic disorder [episodic paroxysmal anxiety] without agoraphobia: Secondary | ICD-10-CM | POA: Diagnosis not present

## 2015-07-15 DIAGNOSIS — F411 Generalized anxiety disorder: Secondary | ICD-10-CM

## 2015-07-15 NOTE — Progress Notes (Signed)
   THERAPIST PROGRESS NOTE  Session Time: [id number]  Participation Level: Active  Behavioral Response: Casual Alert Anxious  Type of Therapy: Individual Therapy  Treatment Goals addressed: Decrease degree to which panic attacks are controlling her life  Interventions: Psycho-education  Suicidal/Homicidal: Denied both  Therapist Interventions:   Asked patient if she had followed through with communicating concerns to her boyfriend has she had planned at her last session when she created an assertiveness script. Patient brought up concerns about her 55 year old daughter. Wonders if she may have an autism spectrum disorder. Offered to go through the diagnostic criteria with patient. After doing so recommended that she encourage her daughter to pursue psychological testing to determine if she does indeed have the disorder. Provided patient with contact information for 2 locations where they offer testing.         Summary:  Once again reported feeling "pretty good." Her only complaint was fatigue which she attributes to physical problems rather than mental health issues. Has been taking naps in the afternoon. Reported that she did communicate some of the things she you wanted to say to her boyfriend. Indicated that she is not stressing about those things as much. Reported that lately a lot of her worries have been related to her daughter. Her daughter lives with her, doesn't have a job, and quit school at the age of 29. Acknowledged that while she doesn't know a lot about autism her daughter has some of the traits characteristic of those with the disorder. Was open to learning more about the symptoms. Indicated that many of the symptoms the therapist described were characteristic of her daughter's behavior both now or in the past. Thanked this therapist for providing her with resources she can share with her daughter.      Plan:   Scheduled to return next week.    Diagnosis: Panic  Disorder                          Generalized Anxiety Disorder                         Bipolar Disorder current episode depressed    Armandina Stammer 07/15/2015

## 2015-07-21 ENCOUNTER — Ambulatory Visit (HOSPITAL_COMMUNITY): Payer: Self-pay | Admitting: Licensed Clinical Social Worker

## 2015-07-28 ENCOUNTER — Ambulatory Visit (HOSPITAL_COMMUNITY): Payer: Self-pay | Admitting: Licensed Clinical Social Worker

## 2015-08-03 ENCOUNTER — Ambulatory Visit (HOSPITAL_COMMUNITY): Payer: Self-pay | Admitting: Psychiatry

## 2015-08-04 ENCOUNTER — Ambulatory Visit (HOSPITAL_COMMUNITY): Payer: Self-pay | Admitting: Licensed Clinical Social Worker

## 2015-08-12 ENCOUNTER — Ambulatory Visit (INDEPENDENT_AMBULATORY_CARE_PROVIDER_SITE_OTHER): Payer: Medicare HMO | Admitting: Psychiatry

## 2015-08-12 ENCOUNTER — Encounter (HOSPITAL_COMMUNITY): Payer: Self-pay | Admitting: Psychiatry

## 2015-08-12 VITALS — BP 124/66 | HR 110 | Ht 65.0 in | Wt 170.0 lb

## 2015-08-12 DIAGNOSIS — M797 Fibromyalgia: Secondary | ICD-10-CM

## 2015-08-12 DIAGNOSIS — F411 Generalized anxiety disorder: Secondary | ICD-10-CM

## 2015-08-12 DIAGNOSIS — F410 Panic disorder [episodic paroxysmal anxiety] without agoraphobia: Secondary | ICD-10-CM

## 2015-08-12 DIAGNOSIS — F3132 Bipolar disorder, current episode depressed, moderate: Secondary | ICD-10-CM

## 2015-08-12 MED ORDER — LAMOTRIGINE 100 MG PO TABS: 100.0000 mg | ORAL_TABLET | Freq: Every day | ORAL | Status: DC

## 2015-08-12 MED ORDER — VENLAFAXINE HCL ER 75 MG PO CP24: 225.0000 mg | ORAL_CAPSULE | Freq: Every day | ORAL | Status: DC

## 2015-08-12 NOTE — Progress Notes (Signed)
Patient ID: Latoya Wilson, female   DOB: 1960-09-27, 55 y.o.   MRN: [id number]  Psychiatric Outpatient Follow up visit Patient Identification: Latoya Wilson MRN:  [id number] Date of Evaluation:  08/12/2015 Referral Source: [facility] office Chief Complaint:   Chief Complaint    Follow-up     Visit Diagnosis:    ICD-9-CM ICD-10-CM   1. Bipolar affective disorder, currently depressed, moderate ([place]) 296.52 F31.32   2. GAD (generalized anxiety disorder) 300.02 F41.1   3. Panic disorder 300.01 F41.0   4. Fibromyalgia 729.1 M79.7    Diagnosis:   Patient Active Problem List   Diagnosis Date Noted  . Panic disorder [F41.0] 04/15/2015  . Bipolar affective disorder, currently depressed, moderate ([place]) [F31.32] 04/15/2015  . Tobacco use disorder [F17.200] 02/18/2015  . Metrorrhagia [N92.1] 05/05/2014  . Pure hypercholesterolemia [E78.00] 05/05/2014  . Fibrositis [M79.7] 07/10/2012  . Apnea, sleep [G47.30] 04/04/2012  . CONTACT DERMATITIS [L25.9] 05/13/2010  . LACERATION OF FINGER [S61.209A] 03/22/2010  . BACK PAIN, LUMBAR, WITH RADICULOPATHY [IMO0002] 01/21/2010  . Essential hypertension [I10] 01/10/2010  . SINUSITIS, CHRONIC [J32.9] 01/07/2010  . PLANTAR WART, RIGHT [B07.0] 10/25/2009  . MEMORY LOSS [R41.3] 06/09/2009  . RESTLESS LEGS SYNDROME [G25.81] 03/03/2009  . 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 BRONCHITIS [J40] 02/24/2009  . NEVUS [D23.9] 03/18/2008  . ABSCESS, TOOTH [K04.7] 12/27/2007  . LESION, VAGINA [D49.59] 06/28/2007  . TENDINITIS [M77.9] 04/19/2007  . Human immunodeficiency virus (HIV) disease ([place]) [B20] 04/11/2007  . ANEMIA, VITAMIN B12 DEFICIENCY [D51.8] 12/13/2006  . HX, PERSONAL, TOBACCO USE [Z87.891] 12/13/2006  . MUSCLE PAIN [IMO0001] 09/10/2006  . 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 ASTHMA [J45.909] 07/24/2006   History of Present Illness:  Patient is a 55 years old currently single Caucasian female  diagnosed with fibromyalgia  and depression possible bipolar,   on disability because of fibromyalgia, Bipolar  and nerve damage in the past that has affected her memory.   Her depression has somewhat responded to Lamictal at 100 mt. There is no rash reported . She is also on effexor. Mood not hopeless and feels combination does work but she struggles with pain aches at times leading to sad mood.   Aggravating factor; her medical complexity, fibromyalgia, neuropathy, finances. Her daughter is 27 years of age transferred gender. Modifying factors; her kids, her mom. Location: depression and mood swings. Anxiety and tiredness Qualtity: flucttuates . It got worse in winter. Now moderate Severity: 7/10. 10 being no depression  Marijuana use: weekly for pain as per history but now says not using. Alcohol use: denies Associated symptoms:   Excessive daytime sleepiness. There is no associated psychotic symptoms or paranoia.   (Hypo) Manic Symptoms:  Distractibility, Anxiety Symptoms:  Excessive Worry, baseline Psychotic Symptoms:  denies PTSD Symptoms: NA  Past Medical History:  Past Medical History  Diagnosis Date  . HIV infection ([place])   . Hypertension   . Hyperlipidemia   . Bipolar affective disorder ([place])   . Restless leg syndrome   . Neuropathy ([place])   . Seizures ([place])   . Sleep apnea   . Narcolepsy     Past Surgical History  Procedure Laterality Date  . Shoulder surgery     Family History:  Family History  Problem Relation Age of Onset  . Mitral valve prolapse Mother   . Hyperlipidemia Mother   . Heart disease Father     MI died at 69  . Bipolar disorder Sister   . Depression Sister   .  Depression Brother    Social History:   Social History   Social History  . Marital Status: Divorced    Spouse Name: N/A  . Number of Children: N/A  . Years of Education: N/A   Social History Main Topics  . Smoking status: Light Tobacco Smoker -- 0.50 packs/day for 35 years    Types: Cigarettes  .  Smokeless tobacco: Never Used  . Alcohol Use: No  . Drug Use: No  . Sexual Activity:    Partners: Male    Birth Control/ Protection: Condom     Comment: pt. given condoms   Other Topics Concern  . None   Social History Narrative    Musculoskeletal: Strength & Muscle Tone: within normal limits Gait & Station: normal Patient leans: Front  Psychiatric Specialty Exam: HPI  Review of Systems  Constitutional: Positive for malaise/fatigue. Negative for fever.  Cardiovascular: Negative for chest pain.  Skin: Negative for rash.  Neurological: Negative for tremors and headaches.  Psychiatric/Behavioral: Negative for depression, suicidal ideas and hallucinations.    Blood pressure 124/66, pulse 110, height 5\' 5"  (1.651 m), weight 170 lb (77.111 kg), SpO2 96 %.Body mass index is 28.29 kg/(m^2).  General Appearance: Casual  Eye Contact:  Fair  Speech:  Slow  Volume:  Normal  Mood:  Euthymic   Affect:  reactive  Thought Process:  Coherent  Orientation:  Full (Time, Place, and Person)  Thought Content:  Rumination  Suicidal Thoughts:  No  Homicidal Thoughts:  No  Memory:  Immediate;   Fair Recent;   Fair  Judgement:  Fair  Insight:  Shallow  Psychomotor Activity:  Normal  Concentration:  Fair  Recall:  Poor  Fund of Knowledge:Fair  Language: Fair  Akathisia:  Negative  Handed:  Right  AIMS (if indicated):    Assets:  Desire for Improvement  ADL's:  Intact  Cognition: Impaired,  Mild  Sleep:  Excessive tiredness    Is the patient at risk to self?  No. Has the patient been a risk to self in the past 6 months?  No. Has the patient been a risk to self within the distant past?  No.    Allergies:   Allergies  Allergen Reactions  . Oxycodone Other (See Comments)    Photosensitivity and nausea and vomiting seizures   Current Medications: Current Outpatient Prescriptions  Medication Sig Dispense Refill  . albuterol (PROVENTIL) (2.5 MG/3ML) 0.083% nebulizer solution  Take 2.5 mg by nebulization every 6 (six) hours as needed for wheezing or shortness of breath.    Marland Kitchen amLODipine (NORVASC) 10 MG tablet Take 10 mg by mouth daily.    Marland Kitchen aspirin 81 MG tablet Take 81 mg by mouth daily. Reported on 03/31/2015    . atazanavir-cobicistat (EVOTAZ) 300-150 MG tablet Take 1 tablet by mouth daily. Swallow whole. Do NOT crush, cut or chew tablet. Take with food. 90 tablet 3  . beclomethasone (QVAR) 80 MCG/ACT inhaler Inhale 2 puffs into the lungs as needed.    . cyanocobalamin (,VITAMIN B-12,) 1000 MCG/ML injection Inject 1,000 mcg into the muscle every 30 (thirty) days. Around the 5th of every month    . emtricitabine-tenofovir AF (DESCOVY) 200-25 MG tablet Take 1 tablet by mouth daily. 90 tablet 3  . lamoTRIgine (LAMICTAL) 100 MG tablet Take 1 tablet (100 mg total) by mouth daily. Take one a day 30 tablet 2  . modafinil (PROVIGIL) 200 MG tablet Take 200 mg by mouth.    . pregabalin (LYRICA) 200  MG capsule Take 150 mg by mouth 3 (three) times daily.    Marland Kitchen tretinoin (RETIN-A) 0.025 % cream Apply topically at bedtime.    . varenicline (CHANTIX) 0.5 MG tablet Take 0.5 mg by mouth 2 (two) times daily. Unsure of dose    . venlafaxine XR (EFFEXOR-XR) 75 MG 24 hr capsule Take 3 capsules (225 mg total) by mouth daily with breakfast. 90 capsule 2   No current facility-administered medications for this visit.    Previous Psychotropic Medications: Yes  Depakote. ativan  Substance Abuse History in the last 12 months:  Yes.    Marijuana weekly as per history . But says not using anymore for last month or so.      Treatment Plan Summary: Medication management and Plan as follows  Bipolar: continue  lamictal 100mg . Has shown some improvement we will continue current dose.  No rash.  Anxiety: continue to work on sleep and coping skills. Will renew effexor 225mg . Initially being prescribed by primary care.  Fibromyalgia: patient on lyrica Increase outdoor activities.  Medical  complexity: Neuropathy, fibromyalgia, other medical condition may contribute to her mood. Otherwise to rule out hypothyroidism and also get her sleep apnea treated. Still working on it.  Marijuana use: patient need to abstain from use Patient is now interested in therapy to deal with her son and conflicts. Reviewed sleep hygiene. Patient is a smoker light smoker is wanted cut down but is not ready to quit counseling done More than 50% spent in counseling and coordination including patient education Call 911 or report to local emergency room for any urgent concerns or suicidal thoughts Follow-up in 8 to 12  weeks or earlier if needed Time spent: 25 minutes   Emi Lymon 5/11/20171:33 PM

## 2015-08-27 ENCOUNTER — Ambulatory Visit (INDEPENDENT_AMBULATORY_CARE_PROVIDER_SITE_OTHER): Payer: Medicare HMO | Admitting: Licensed Clinical Social Worker

## 2015-08-27 DIAGNOSIS — F410 Panic disorder [episodic paroxysmal anxiety] without agoraphobia: Secondary | ICD-10-CM

## 2015-08-27 DIAGNOSIS — F411 Generalized anxiety disorder: Secondary | ICD-10-CM

## 2015-08-27 DIAGNOSIS — F3132 Bipolar disorder, current episode depressed, moderate: Secondary | ICD-10-CM | POA: Diagnosis not present
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
## 2015-09-09 ENCOUNTER — Ambulatory Visit (HOSPITAL_COMMUNITY): Payer: Self-pay | Admitting: Licensed Clinical Social Worker

## 2015-09-23 ENCOUNTER — Ambulatory Visit (INDEPENDENT_AMBULATORY_CARE_PROVIDER_SITE_OTHER): Payer: Medicare HMO | Admitting: Licensed Clinical Social Worker

## 2015-09-23 DIAGNOSIS — F410 Panic disorder [episodic paroxysmal anxiety] without agoraphobia: Secondary | ICD-10-CM

## 2015-09-23 DIAGNOSIS — F3132 Bipolar disorder, current episode depressed, moderate: Secondary | ICD-10-CM

## 2015-09-23 DIAGNOSIS — F411 Generalized anxiety disorder: Secondary | ICD-10-CM

## 2015-09-23 NOTE — Progress Notes (Signed)
   THERAPIST PROGRESS NOTE  Session Time: 1 PM to 1:50 PM Participation Level: Active  Behavioral Response: CasualAlertDysphoric, Euthymic and Mood libility  Type of Therapy: Individual Therapy  Treatment Goals addressed: Decrease degree that panic attacks are controlling her life  Interventions: CBT, Solution Focused, Strength-based, Supportive and Other: Drug and out goal whole counseling, coping skills for anxiety and panic  Summary: Latoya Wilson is a 55 y.o. female who presents with not doing well trying to manage her anxiety. She is frustrated and upset and was tearful because she was on Ativan for years and it really work for her. It gave her confidence to do things and didn't have as many anxiety attacks. It helped her to get back on track with what she needed to do . She never abused that and never took as much as was prescribed.She relates that is the only thing that worked. Discussed upcoming operation for carpel tunnel and trigger fingers for right arm. Her hand are always in pain and because of fibromyagia always in pain. Needs to see back doctor but does not have the money. She can't stand at sink without back hurting. She has bulging disc. She doesn't take pain pills. She doesn't drink or use marijuana. Feels that she has short term and long term memory problems so she can't remember the coping strategies. Even with coping strategies she feels that she still has anxiety and panic. She verbalized that if she can get the medication she will have to accept it and live isolated with her few supports. She wants to be happy and wants to be her. She pushes herself to walk at the gym. She tries to ignore the pain and lie herself. Reviewed her experience of anxiety and she said it's over stupid things where she cannot put clothes in the drawer or vacuum. She has to run to avoid. She has had this since this 55. Reviewed that she will discuss with doctorDr her belief that Ativan will be helpful for  her treatment.   Suicidal/Homicidal: No  Therapist Response: Therapist reviewed with patient different strategies for managing anxiety and panic. This includes not entertaining anxious thought, not avoiding panic and distraction. Provided ongoing emotional support related to patient's feelings of not being prescribed Ativan. Help patient look at the big picture, that Ativan is not a long-term solution and his addictive as well as looking at the pros and cons of taking the medication. There appears explained that in addressing anxiety panic one has to develop coping strategies as well as taking medication. Therapist encouraged patient to look at cognitions to uncover anxiety producing thoughts. Therapist's work with patient on focusing on her strengths and resources for example continuing to exercise and doing chores despite the fact that she has pain. Ppatient continues to feel Ativan is the best solution therapist told her to talk to her doctor about what she feels will be helpful and also explained the reasoning that doctors are hesitant to prescribe benzos due to their addictive nature and that there are other alternative medications that can be as effective.    Plan: Return again in 2 weeks.2. Therapist continue to work with patient on gaining insight as to effective use of coping skills to manage anxiety and patient take steps to implement into her daily life  Diagnosis: Axis I: Bipolar, Depressed, Generalized Anxiety Disorder and Panic Disorder    Axis II: No diagnosis    Bowman,Mary A, LCSW 09/23/2015

## 2015-09-28 ENCOUNTER — Ambulatory Visit (INDEPENDENT_AMBULATORY_CARE_PROVIDER_SITE_OTHER): Payer: Medicare HMO | Admitting: Psychiatry

## 2015-09-28 ENCOUNTER — Encounter (HOSPITAL_COMMUNITY): Payer: Self-pay | Admitting: Psychiatry

## 2015-09-28 VITALS — BP 136/74 | HR 100 | Ht 65.0 in | Wt 170.0 lb

## 2015-09-28 DIAGNOSIS — F411 Generalized anxiety disorder: Secondary | ICD-10-CM | POA: Diagnosis not present

## 2015-09-28 DIAGNOSIS — F4321 Adjustment disorder with depressed mood: Secondary | ICD-10-CM

## 2015-09-28 DIAGNOSIS — F3132 Bipolar disorder, current episode depressed, moderate: Secondary | ICD-10-CM | POA: Diagnosis not present

## 2015-09-28 DIAGNOSIS — F410 Panic disorder [episodic paroxysmal anxiety] without agoraphobia: Secondary | ICD-10-CM | POA: Diagnosis not present

## 2015-09-28 DIAGNOSIS — M797 Fibromyalgia: Secondary | ICD-10-CM

## 2015-09-28 MED ORDER — BUSPIRONE HCL 10 MG PO TABS: 10.0000 mg | ORAL_TABLET | Freq: Two times a day (BID) | ORAL | Status: DC

## 2015-09-28 NOTE — Progress Notes (Signed)
Patient ID: Latoya Wilson, female   DOB: September 07, 1960, 55 y.o.   MRN: [id number]  Psychiatric Outpatient Follow up visit Patient Identification: Latoya Wilson MRN:  [id number] Date of Evaluation:  09/28/2015 Referral Source: [facility] office Chief Complaint:   Chief Complaint    Follow-up     Visit Diagnosis:    ICD-9-CM ICD-10-CM   1. Bipolar affective disorder, currently depressed, moderate ([place]) 296.52 F31.32   2. GAD (generalized anxiety disorder) 300.02 F41.1   3. Panic disorder 300.01 F41.0   4. Fibromyalgia 729.1 M79.7   5. Grief 309.0 F43.21    Diagnosis:   Patient Active Problem List   Diagnosis Date Noted  . Panic disorder [F41.0] 04/15/2015  . Bipolar affective disorder, currently depressed, moderate ([place]) [F31.32] 04/15/2015  . Tobacco use disorder [F17.200] 02/18/2015  . Metrorrhagia [N92.1] 05/05/2014  . Pure hypercholesterolemia [E78.00] 05/05/2014  . Fibrositis [M79.7] 07/10/2012  . Apnea, sleep [G47.30] 04/04/2012  . CONTACT DERMATITIS [L25.9] 05/13/2010  . LACERATION OF FINGER [S61.209A] 03/22/2010  . BACK PAIN, LUMBAR, WITH RADICULOPATHY [IMO0002] 01/21/2010  . Essential hypertension [I10] 01/10/2010  . SINUSITIS, CHRONIC [J32.9] 01/07/2010  . PLANTAR WART, RIGHT [B07.0] 10/25/2009  . MEMORY LOSS [R41.3] 06/09/2009  . RESTLESS LEGS SYNDROME [G25.81] 03/03/2009  . 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 BRONCHITIS [J40] 02/24/2009  . NEVUS [D23.9] 03/18/2008  . ABSCESS, TOOTH [K04.7] 12/27/2007  . LESION, VAGINA [D49.59] 06/28/2007  . TENDINITIS [M77.9] 04/19/2007  . Human immunodeficiency virus (HIV) disease ([place]) [B20] 04/11/2007  . ANEMIA, VITAMIN B12 DEFICIENCY [D51.8] 12/13/2006  . HX, PERSONAL, TOBACCO USE [Z87.891] 12/13/2006  . MUSCLE PAIN [IMO0001] 09/10/2006  . 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 ASTHMA [J45.909] 07/24/2006   History of Present Illness:  Patient is a 55 years old currently single Caucasian female   diagnosed with fibromyalgia and depression possible bipolar,   on disability because of fibromyalgia, Bipolar  and nerve damage in the past that has affected her memory.   Her depression has somewhat responded in past  to Lamictal at 100 mt. There is no rash reported .   This is a earlier follow-up visit because her son died 2 days ago. Apparently she talks to him every Sunday but decided that he did not pick up the phone when he reached the house nobody open the door and withdrawn police was called and found that he was lying in the kitchen floor dead. Patient is trying to be around family and friends but it is difficult and unfortunate for her and that is exacerbated her depression and grief. Patient is endorsing crying spells and anhedonia. She was teary.  Her grief is also increasing her anxiety.  Aggravating factor; son's death. her medical complexity, fibromyalgia, neuropathy, finances. Her daughter is 56 years of age transferred gender. Modifying factors; her kids, her mom. Location: depression and mood swings. Anxiety and tiredness Qualtity: flucttuates . It got worse in winter. Now moderate Severity: 4/10. 10 being no depression (wsorsened)  Marijuana use: weekly for pain as per history but now says not using. Alcohol use: denies Associated symptoms:   Excessive daytime sleepiness. There is no associated psychotic symptoms or paranoia.   (Hypo) Manic Symptoms:  Distractibility, Anxiety Symptoms:  Excessive Worry,  Psychotic Symptoms:  denies PTSD Symptoms: NA  Past Medical History:  Past Medical History  Diagnosis Date  . HIV infection ([place])   . Hypertension   . Hyperlipidemia   . Bipolar affective disorder ([place])   . Restless leg syndrome   .  Neuropathy ([place])   . Seizures ([place])   . Sleep apnea   . Narcolepsy     Past Surgical History  Procedure Laterality Date  . Shoulder surgery     Family History:  Family History  Problem Relation Age of Onset  . Mitral valve  prolapse Mother   . Hyperlipidemia Mother   . Heart disease Father     MI died at 79  . Bipolar disorder Sister   . Depression Sister   . Depression Brother    Social History:   Social History   Social History  . Marital Status: Divorced    Spouse Name: N/A  . Number of Children: N/A  . Years of Education: N/A   Social History Main Topics  . Smoking status: Light Tobacco Smoker -- 0.50 packs/day for 35 years    Types: Cigarettes  . Smokeless tobacco: Never Used  . Alcohol Use: No  . Drug Use: No  . Sexual Activity:    Partners: Male    Birth Control/ Protection: Condom     Comment: pt. given condoms   Other Topics Concern  . None   Social History Narrative    Musculoskeletal: Strength & Muscle Tone: within normal limits Gait & Station: normal Patient leans: Front  Psychiatric Specialty Exam: HPI  Review of Systems  Constitutional: Positive for malaise/fatigue. Negative for fever.  Cardiovascular: Negative for chest pain.  Skin: Negative for rash.  Neurological: Negative for tremors and headaches.  Psychiatric/Behavioral: Negative for depression, suicidal ideas and hallucinations.    Blood pressure 136/74, pulse 100, height 5\' 5"  (1.651 m), weight 170 lb (77.111 kg), SpO2 92 %.Body mass index is 28.29 kg/(m^2).  General Appearance: Casual  Eye Contact:  Fair  Speech:  Slow  Volume:  Normal  Mood:  Depressed, anxious and teary  Affect:  reactive  Thought Process:  Coherent  Orientation:  Full (Time, Place, and Person)  Thought Content:  Rumination  Suicidal Thoughts:  No  Homicidal Thoughts:  No  Memory:  Immediate;   Fair Recent;   Fair  Judgement:  Fair  Insight:  Shallow  Psychomotor Activity:  Normal  Concentration:  Fair  Recall:  Poor  Fund of Knowledge:Fair  Language: Fair  Akathisia:  Negative  Handed:  Right  AIMS (if indicated):    Assets:  Desire for Improvement  ADL's:  Intact  Cognition: Impaired,  Mild  Sleep:  Excessive  tiredness    Is the patient at risk to self?  No. Has the patient been a risk to self in the past 6 months?  No. Has the patient been a risk to self within the distant past?  No.    Allergies:   Allergies  Allergen Reactions  . Oxycodone Other (See Comments)    Photosensitivity and nausea and vomiting seizures   Current Medications: Current Outpatient Prescriptions  Medication Sig Dispense Refill  . albuterol (PROVENTIL) (2.5 MG/3ML) 0.083% nebulizer solution Take 2.5 mg by nebulization every 6 (six) hours as needed for wheezing or shortness of breath.    Marland Kitchen amLODipine (NORVASC) 10 MG tablet Take 10 mg by mouth daily.    Marland Kitchen atazanavir-cobicistat (EVOTAZ) 300-150 MG tablet Take 1 tablet by mouth daily. Swallow whole. Do NOT crush, cut or chew tablet. Take with food. 90 tablet 3  . beclomethasone (QVAR) 80 MCG/ACT inhaler Inhale 2 puffs into the lungs as needed.    . cyanocobalamin (,VITAMIN B-12,) 1000 MCG/ML injection Inject 1,000 mcg into the muscle every 30 (  thirty) days. Around the 5th of every month    . emtricitabine-tenofovir AF (DESCOVY) 200-25 MG tablet Take 1 tablet by mouth daily. 90 tablet 3  . lamoTRIgine (LAMICTAL) 100 MG tablet Take 1 tablet (100 mg total) by mouth daily. Take one a day 30 tablet 2  . modafinil (PROVIGIL) 200 MG tablet Take 200 mg by mouth.    . pregabalin (LYRICA) 200 MG capsule Take 150 mg by mouth 3 (three) times daily.    Marland Kitchen tretinoin (RETIN-A) 0.025 % cream Apply topically at bedtime.    . varenicline (CHANTIX) 0.5 MG tablet Take 0.5 mg by mouth 2 (two) times daily. Unsure of dose    . venlafaxine XR (EFFEXOR-XR) 75 MG 24 hr capsule Take 3 capsules (225 mg total) by mouth daily with breakfast. 90 capsule 2  . aspirin 81 MG tablet Take 81 mg by mouth daily. Reported on 09/28/2015    . busPIRone (BUSPAR) 10 MG tablet Take 1 tablet (10 mg total) by mouth 2 (two) times daily. 60 tablet 0   No current facility-administered medications for this visit.     Previous Psychotropic Medications: Yes  Depakote. ativan  Substance Abuse History in the last 12 months:  Yes.    Marijuana weekly as per history . But says not using anymore for last month or so.      Treatment Plan Summary: Medication management and Plan as follows   Grief: she is already on effexor but is having more anxiety. Provided supportive therapy and will schedule with counsellor. No toughts of self harm Be around supportive family and friends.  Will add buspirone for anxiety 10mg  bid, says it has helped in remote past . Bipolar: continue  lamictal 100mg .   No rash.  Anxiety: continue effexor and  to work on sleep and coping skills. Has refill   Fibromyalgia: patient on lyrica   Medical complexity: Neuropathy, fibromyalgia, other medical condition may contribute to her mood.  Marijuana use: patient need to abstain from use Patient is currently under therapy. Reviewed sleep hygiene. Patient is a smoker light smoker is wanted cut down but is not ready to quit counseling done More than 50% spent in counseling and coordination including patient education Call 911 or report to local emergency room for any urgent concerns or suicidal thoughts Follow-up in 3-4 weeks or earlier if needed Time spent: 25 minutes   Travian Kerner 6/27/20174:01 PM

## 2015-10-06 ENCOUNTER — Ambulatory Visit (HOSPITAL_COMMUNITY): Payer: Self-pay | Admitting: Licensed Clinical Social Worker

## 2015-10-15 ENCOUNTER — Ambulatory Visit (HOSPITAL_COMMUNITY): Payer: Self-pay | Admitting: Licensed Clinical Social Worker

## 2015-11-02 ENCOUNTER — Ambulatory Visit (INDEPENDENT_AMBULATORY_CARE_PROVIDER_SITE_OTHER): Payer: Medicare HMO | Admitting: Psychiatry

## 2015-11-02 ENCOUNTER — Encounter (HOSPITAL_COMMUNITY): Payer: Self-pay | Admitting: Psychiatry

## 2015-11-02 ENCOUNTER — Telehealth (HOSPITAL_COMMUNITY): Payer: Self-pay | Admitting: *Deleted

## 2015-11-02 VITALS — BP 130/78 | HR 106 | Ht 65.0 in | Wt 166.8 lb

## 2015-11-02 DIAGNOSIS — F4321 Adjustment disorder with depressed mood: Secondary | ICD-10-CM

## 2015-11-02 DIAGNOSIS — F410 Panic disorder [episodic paroxysmal anxiety] without agoraphobia: Secondary | ICD-10-CM | POA: Diagnosis not present

## 2015-11-02 DIAGNOSIS — F3132 Bipolar disorder, current episode depressed, moderate: Secondary | ICD-10-CM | POA: Diagnosis not present

## 2015-11-02 DIAGNOSIS — M797 Fibromyalgia: Secondary | ICD-10-CM

## 2015-11-02 DIAGNOSIS — F411 Generalized anxiety disorder: Secondary | ICD-10-CM | POA: Diagnosis not present

## 2015-11-02 MED ORDER — BUPROPION HCL 100 MG PO TABS: 100.0000 mg | ORAL_TABLET | Freq: Every day | ORAL | 0 refills | Status: DC

## 2015-11-02 MED ORDER — BUPROPION HCL ER (SR) 100 MG PO TB12: 100.0000 mg | ORAL_TABLET | Freq: Every day | ORAL | Status: AC

## 2015-11-02 NOTE — Telephone Encounter (Signed)
Pt needs medication sent to [facility]. Please call once rx is sent.

## 2015-11-02 NOTE — Progress Notes (Signed)
Patient ID: Latoya Wilson, female   DOB: 21-Apr-1960, 55 y.o.   MRN: [id number]  Psychiatric Outpatient Follow up visit Patient Identification: Latoya Wilson MRN:  [id number] Date of Evaluation:  11/02/2015 Referral Source: Tacy Dura office Chief Complaint:   Chief Complaint    Follow-up     Visit Diagnosis:    ICD-9-CM ICD-10-CM   1. Bipolar affective disorder, currently depressed, moderate ([place]) 296.52 F31.32   2. Grief 309.0 F43.21   3. GAD (generalized anxiety disorder) 300.02 F41.1   4. Panic disorder 300.01 F41.0   5. Fibromyalgia 729.1 M79.7    Diagnosis:   Patient Active Problem List   Diagnosis Date Noted  . Panic disorder [F41.0] 04/15/2015  . Bipolar affective disorder, currently depressed, moderate ([place]) [F31.32] 04/15/2015  . Tobacco use disorder [F17.200] 02/18/2015  . Metrorrhagia [N92.1] 05/05/2014  . Pure hypercholesterolemia [E78.00] 05/05/2014  . Fibrositis [M79.7] 07/10/2012  . Apnea, sleep [G47.30] 04/04/2012  . CONTACT DERMATITIS [L25.9] 05/13/2010  . LACERATION OF FINGER [S61.209A] 03/22/2010  . BACK PAIN, LUMBAR, WITH RADICULOPATHY [IMO0002] 01/21/2010  . Essential hypertension [I10] 01/10/2010  . SINUSITIS, CHRONIC [J32.9] 01/07/2010  . PLANTAR WART, RIGHT [B07.0] 10/25/2009  . MEMORY LOSS [R41.3] 06/09/2009  . RESTLESS LEGS SYNDROME [G25.81] 03/03/2009  . 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 BRONCHITIS [J40] 02/24/2009  . NEVUS [D23.9] 03/18/2008  . ABSCESS, TOOTH [K04.7] 12/27/2007  . LESION, VAGINA [D49.59] 06/28/2007  . TENDINITIS [M77.9] 04/19/2007  . Human immunodeficiency virus (HIV) disease ([place]) [B20] 04/11/2007  . ANEMIA, VITAMIN B12 DEFICIENCY [D51.8] 12/13/2006  . HX, PERSONAL, TOBACCO USE [Z87.891] 12/13/2006  . MUSCLE PAIN [IMO0001] 09/10/2006  . 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 ASTHMA [J45.909] 07/24/2006   History of Present Illness:  Patient is a 55 years old currently single Caucasian female   diagnosed with fibromyalgia and depression possible bipolar,   on disability because of fibromyalgia, Bipolar  and nerve damage in the past that has affected her memory.   Still going through the grief process of that of her son. Says her boyfriend does not understand and still wants to have sex with her and this upsets her.  BuSpar added last visit patient is sedated. She does have narcolepsy and is on Modafanil.  Patient is still endorsing crying spells and anhedonia. Says she used maijuna when she felt buspar didn't help.  Her grief increases her anxiety.  Aggravating factor; son's death. her medical complexity, fibromyalgia, neuropathy, finances. Her daughter is 10 years of age transferred gender. Modifying factors; her kids, her mom. Location: depression and mood swings. Anxiety and tiredness Qualtity: flucttuates . It got worse in winter. Now moderate Severity: 4/10. 10 being no depression (wsorsened)  Marijuana use: weekly for pain as per history but now says not using. Alcohol use: denies Associated symptoms:   Excessive daytime sleepiness. There is no associated psychotic symptoms or paranoia.   (Hypo) Manic Symptoms:  Distractibility, Anxiety Symptoms:  Excessive Worry,  Psychotic Symptoms:  denies PTSD Symptoms: NA  Past Medical History:  Past Medical History:  Diagnosis Date  . Bipolar affective disorder ([place])   . HIV infection ([place])   . Hyperlipidemia   . Hypertension   . Narcolepsy   . Neuropathy ([place])   . Restless leg syndrome   . Seizures ([place])   . Sleep apnea     Past Surgical History:  Procedure Laterality Date  . SHOULDER SURGERY     Family History:  Family History  Problem Relation Age of Onset  .  Mitral valve prolapse Mother   . Hyperlipidemia Mother   . Heart disease Father     MI died at 42  . Bipolar disorder Sister   . Depression Sister   . Depression Brother    Social History:   Social History   Social History  . Marital status:  Divorced    Spouse name: N/A  . Number of children: N/A  . Years of education: N/A   Social History Main Topics  . Smoking status: Heavy Tobacco Smoker    Packs/day: 2.50    Years: 35.00    Types: Cigarettes  . Smokeless tobacco: Never Used  . Alcohol use No  . Drug use: No  . Sexual activity: Yes    Partners: Male    Birth control/ protection: Condom     Comment: pt. given condoms   Other Topics Concern  . Not on file   Social History Narrative  . No narrative on file    Musculoskeletal: Strength & Muscle Tone: within normal limits Gait & Station: normal Patient leans: Front  Psychiatric Specialty Exam: HPI  Review of Systems  Constitutional: Positive for malaise/fatigue. Negative for fever.  Cardiovascular: Negative for chest pain.  Skin: Negative for rash.  Neurological: Negative for tremors and headaches.  Psychiatric/Behavioral: Positive for depression. Negative for hallucinations and suicidal ideas. The patient is nervous/anxious.     Blood pressure 130/78, pulse (!) 106, height 5\' 5"  (1.651 m), weight 166 lb 12.8 oz (75.7 kg), SpO2 97 %.Body mass index is 27.76 kg/m.  General Appearance: Casual  Eye Contact:  Fair  Speech:  Slow  Volume:  Normal  Mood:  Depressed   Affect:  reactive  Thought Process:  Coherent  Orientation:  Full (Time, Place, and Person)  Thought Content:  Rumination  Suicidal Thoughts:  No  Homicidal Thoughts:  No  Memory:  Immediate;   Fair Recent;   Fair  Judgement:  Fair  Insight:  Shallow  Psychomotor Activity:  Normal  Concentration:  Fair  Recall:  Poor  Fund of Knowledge:Fair  Language: Fair  Akathisia:  Negative  Handed:  Right  AIMS (if indicated):    Assets:  Desire for Improvement  ADL's:  Intact  Cognition: Impaired,  Mild  Sleep:  Excessive tiredness    Is the patient at risk to self?  No. Has the patient been a risk to self in the past 6 months?  No. Has the patient been a risk to self within the distant  past?  No.    Allergies:   Allergies  Allergen Reactions  . Oxycodone Other (See Comments)    Photosensitivity and nausea and vomiting seizures   Current Medications: Current Outpatient Prescriptions  Medication Sig Dispense Refill  . albuterol (PROVENTIL) (2.5 MG/3ML) 0.083% nebulizer solution Take 2.5 mg by nebulization every 6 (six) hours as needed for wheezing or shortness of breath.    Marland Kitchen amLODipine (NORVASC) 10 MG tablet Take 10 mg by mouth daily.    Marland Kitchen atazanavir-cobicistat (EVOTAZ) 300-150 MG tablet Take 1 tablet by mouth daily. Swallow whole. Do NOT crush, cut or chew tablet. Take with food. 90 tablet 3  . beclomethasone (QVAR) 80 MCG/ACT inhaler Inhale 2 puffs into the lungs as needed.    . cyanocobalamin (,VITAMIN B-12,) 1000 MCG/ML injection Inject 1,000 mcg into the muscle every 30 (thirty) days. Around the 5th of every month    . emtricitabine-tenofovir AF (DESCOVY) 200-25 MG tablet Take 1 tablet by mouth daily. 90 tablet 3  .  lamoTRIgine (LAMICTAL) 100 MG tablet Take 1 tablet (100 mg total) by mouth daily. Take one a day 30 tablet 2  . modafinil (PROVIGIL) 200 MG tablet Take 200 mg by mouth.    . pregabalin (LYRICA) 200 MG capsule Take 150 mg by mouth 3 (three) times daily.    Marland Kitchen tretinoin (RETIN-A) 0.025 % cream Apply topically at bedtime.    . varenicline (CHANTIX) 0.5 MG tablet Take 0.5 mg by mouth 2 (two) times daily. Unsure of dose    . venlafaxine XR (EFFEXOR-XR) 75 MG 24 hr capsule Take 3 capsules (225 mg total) by mouth daily with breakfast. 90 capsule 2  . aspirin 81 MG tablet Take 81 mg by mouth daily. Reported on 09/28/2015     Current Facility-Administered Medications  Medication Dose Route Frequency Provider Last Rate Last Dose  . buPROPion (WELLBUTRIN SR) 12 hr tablet 100 mg  100 mg Oral Daily Merian Capron, MD        Previous Psychotropic Medications: Yes  Depakote. ativan  Substance Abuse History in the last 12 months:  Yes.    Marijuana weekly as per  history . But says not using anymore for last month or so.      Treatment Plan Summary: Medication management and Plan as follows   Grief: she is already on effexor but is having more anxiety. Provided supportive therapy and will schedule with counsellor. No toughts of self harm Be around supportive family and friends.  Stop buspar.  Will add wellbutrin 100mg  for augmentation for depression. Risk, side effects explained. Bipolar: continue  lamictal 100mg .   No rash.  Anxiety: continue effexor. Has therapy appointment tomorrow, encouraged to keep that so to work on grief and CBT,.   Fibromyalgia: patient on lyrica   Medical complexity: Neuropathy, fibromyalgia, other medical condition may contribute to her mood.  Marijuana use: patient need to abstain from use Patient is currently under therapy. Reviewed sleep hygiene. Patient is a smoker light smoker is wanted cut down but is not ready to quit counseling done More than 50% spent in counseling and coordination including patient education Call 911 or report to local emergency room for any urgent concerns or suicidal thoughts Follow-up in 3-4 weeks or earlier if needed Time spent: 25 minutes   Daiden Coltrane 8/1/20171:49 PM

## 2015-11-02 NOTE — Telephone Encounter (Signed)
Per Dr. De Nurse, please send rx for Wellbutrin 100mg , #30 w/ 0 refills to pharmacy. Pt is schedule for a f/u appt on 9/1. Called and informed pt of refill status. Pt shows understanding.

## 2015-11-03 ENCOUNTER — Ambulatory Visit (HOSPITAL_COMMUNITY): Payer: Self-pay | Admitting: Licensed Clinical Social Worker

## 2015-12-02 ENCOUNTER — Ambulatory Visit (INDEPENDENT_AMBULATORY_CARE_PROVIDER_SITE_OTHER): Payer: Medicare HMO | Admitting: Psychiatry

## 2015-12-02 ENCOUNTER — Encounter (HOSPITAL_COMMUNITY): Payer: Self-pay | Admitting: Psychiatry

## 2015-12-02 VITALS — BP 118/70 | HR 98 | Ht 65.0 in | Wt 162.0 lb

## 2015-12-02 DIAGNOSIS — F3132 Bipolar disorder, current episode depressed, moderate: Secondary | ICD-10-CM

## 2015-12-02 DIAGNOSIS — F410 Panic disorder [episodic paroxysmal anxiety] without agoraphobia: Secondary | ICD-10-CM

## 2015-12-02 DIAGNOSIS — F411 Generalized anxiety disorder: Secondary | ICD-10-CM | POA: Diagnosis not present

## 2015-12-02 DIAGNOSIS — F4321 Adjustment disorder with depressed mood: Secondary | ICD-10-CM | POA: Diagnosis not present

## 2015-12-02 DIAGNOSIS — M797 Fibromyalgia: Secondary | ICD-10-CM

## 2015-12-02 MED ORDER — VENLAFAXINE HCL ER 75 MG PO CP24: 225.0000 mg | ORAL_CAPSULE | Freq: Every day | ORAL | 1 refills | Status: DC

## 2015-12-02 MED ORDER — BUPROPION HCL 100 MG PO TABS: 100.0000 mg | ORAL_TABLET | Freq: Every day | ORAL | 1 refills | Status: DC

## 2015-12-02 MED ORDER — LAMOTRIGINE 100 MG PO TABS: 100.0000 mg | ORAL_TABLET | Freq: Every day | ORAL | 1 refills | Status: DC

## 2015-12-02 NOTE — Progress Notes (Signed)
Patient ID: Latoya Wilson, female   DOB: 29-Jan-1961, 55 y.o.   MRN: [id number]  Psychiatric Outpatient Follow up visit Patient Identification: Latoya Wilson MRN:  [id number] Date of Evaluation:  12/02/2015 Referral Source: [facility] office Chief Complaint:   Chief Complaint    Follow-up     Visit Diagnosis:    ICD-9-CM ICD-10-CM   1. Bipolar affective disorder, currently depressed, moderate ([place]) 296.52 F31.32   2. GAD (generalized anxiety disorder) 300.02 F41.1   3. Grief 309.0 F43.21   4. Panic disorder 300.01 F41.0   5. Fibromyalgia 729.1 M79.7    Diagnosis:   Patient Active Problem List   Diagnosis Date Noted  . Panic disorder [F41.0] 04/15/2015  . Bipolar affective disorder, currently depressed, moderate ([place]) [F31.32] 04/15/2015  . Tobacco use disorder [F17.200] 02/18/2015  . Metrorrhagia [N92.1] 05/05/2014  . Pure hypercholesterolemia [E78.00] 05/05/2014  . Fibrositis [M79.7] 07/10/2012  . Apnea, sleep [G47.30] 04/04/2012  . CONTACT DERMATITIS [L25.9] 05/13/2010  . LACERATION OF FINGER [S61.209A] 03/22/2010  . BACK PAIN, LUMBAR, WITH RADICULOPATHY [IMO0002] 01/21/2010  . Essential hypertension [I10] 01/10/2010  . SINUSITIS, CHRONIC [J32.9] 01/07/2010  . PLANTAR WART, RIGHT [B07.0] 10/25/2009  . MEMORY LOSS [R41.3] 06/09/2009  . RESTLESS LEGS SYNDROME [G25.81] 03/03/2009  . 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 BRONCHITIS [J40] 02/24/2009  . NEVUS [D23.9] 03/18/2008  . ABSCESS, TOOTH [K04.7] 12/27/2007  . LESION, VAGINA [D49.59] 06/28/2007  . TENDINITIS [M77.9] 04/19/2007  . Human immunodeficiency virus (HIV) disease ([place]) [B20] 04/11/2007  . ANEMIA, VITAMIN B12 DEFICIENCY [D51.8] 12/13/2006  . HX, PERSONAL, TOBACCO USE [Z87.891] 12/13/2006  . MUSCLE PAIN [IMO0001] 09/10/2006  . 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 ASTHMA [J45.909] 07/24/2006   History of Present Illness:  Patient is a 55  years old currently single Caucasian female   diagnosed with fibromyalgia and depression possible bipolar,   on disability because of fibromyalgia, Bipolar  and nerve damage in the past that has affected her memory.   Still going through the grief process of that of her son.  Last visit Wellbutrin was added but she's not sure she is taking once a day or twice a day +2 out and for her depressive symptoms. Her boyfriend is trying to be more cooperative regarding her grief process  She does have narcolepsy and is on Modafanil.   Says she used maijuna when she felt buspar didn't help. Uses sparingly.   Aggravating factor; son's death. her medical complexity, fibromyalgia, neuropathy, finances. Her daughter is 44 years of age transferred gender. Modifying factors; her kids, her mom. Location: depression and mood swings. Anxiety and tiredness Qualtity: flucttuates . It got worse in winter. Now moderate Severity: 5 /10. 10 being no depression . slighly better  Marijuana use: weekly for pain as per history but now says not using. Alcohol use: denies Associated symptoms:   Excessive daytime sleepiness. There is no associated psychotic symptoms or paranoia.   (Hypo) Manic Symptoms:  Distractibility, Anxiety Symptoms:  Excessive Worry, same Psychotic Symptoms:  denies PTSD Symptoms: NA  Past Medical History:  Past Medical History:  Diagnosis Date  . Bipolar affective disorder ([place])   . HIV infection ([place])   . Hyperlipidemia   . Hypertension   . Narcolepsy   . Neuropathy ([place])   . Restless leg syndrome   . Seizures ([place])   . Sleep apnea     Past Surgical History:  Procedure Laterality Date  . SHOULDER SURGERY     Family History:  Family  History  Problem Relation Age of Onset  . Mitral valve prolapse Mother   . Hyperlipidemia Mother   . Heart disease Father     MI died at 70  . Bipolar disorder Sister   . Depression Sister   . Depression Brother    Social History:   Social History   Social History  . Marital status:  Divorced    Spouse name: N/A  . Number of children: N/A  . Years of education: N/A   Social History Main Topics  . Smoking status: Heavy Tobacco Smoker    Packs/day: 2.50    Years: 35.00    Types: Cigarettes  . Smokeless tobacco: Never Used  . Alcohol use No  . Drug use: No  . Sexual activity: Yes    Partners: Male    Birth control/ protection: Condom     Comment: pt. given condoms   Other Topics Concern  . None   Social History Narrative  . None    Musculoskeletal: Strength & Muscle Tone: within normal limits Gait & Station: normal Patient leans: Front  Psychiatric Specialty Exam: HPI  Review of Systems  Constitutional: Positive for malaise/fatigue. Negative for fever.  Cardiovascular: Negative for chest pain.  Skin: Negative for rash.  Neurological: Negative for tremors and headaches.  Psychiatric/Behavioral: Positive for depression. Negative for hallucinations and suicidal ideas.    Blood pressure 118/70, pulse 98, height 5\' 5"  (1.651 m), weight 162 lb (73.5 kg), SpO2 96 %.Body mass index is 26.96 kg/m.  General Appearance: Casual  Eye Contact:  Fair  Speech:  Slow  Volume:  Normal  Mood:  Somewhat dysthymic   Affect:  reactive  Thought Process:  Coherent  Orientation:  Full (Time, Place, and Person)  Thought Content:  Rumination  Suicidal Thoughts:  No  Homicidal Thoughts:  No  Memory:  Immediate;   Fair Recent;   Fair  Judgement:  Fair  Insight:  Shallow  Psychomotor Activity:  Normal  Concentration:  Fair  Recall:  Poor  Fund of Knowledge:Fair  Language: Fair  Akathisia:  Negative  Handed:  Right  AIMS (if indicated):    Assets:  Desire for Improvement  ADL's:  Intact  Cognition: Impaired,  Mild  Sleep:  Excessive tiredness    Is the patient at risk to self?  No. Has the patient been a risk to self in the past 6 months?  No. Has the patient been a risk to self within the distant past?  No.    Allergies:   Allergies  Allergen  Reactions  . Oxycodone Other (See Comments)    Photosensitivity and nausea and vomiting seizures   Current Medications: Current Outpatient Prescriptions  Medication Sig Dispense Refill  . albuterol (PROVENTIL) (2.5 MG/3ML) 0.083% nebulizer solution Take 2.5 mg by nebulization every 6 (six) hours as needed for wheezing or shortness of breath.    Marland Kitchen amLODipine (NORVASC) 10 MG tablet Take 10 mg by mouth daily.    Marland Kitchen atazanavir-cobicistat (EVOTAZ) 300-150 MG tablet Take 1 tablet by mouth daily. Swallow whole. Do NOT crush, cut or chew tablet. Take with food. 90 tablet 3  . beclomethasone (QVAR) 80 MCG/ACT inhaler Inhale 2 puffs into the lungs as needed.    Marland Kitchen buPROPion (WELLBUTRIN) 100 MG tablet Take 1 tablet (100 mg total) by mouth daily. 30 tablet 1  . cyanocobalamin (,VITAMIN B-12,) 1000 MCG/ML injection Inject 1,000 mcg into the muscle every 30 (thirty) days. Around the 5th of every month    .  emtricitabine-tenofovir AF (DESCOVY) 200-25 MG tablet Take 1 tablet by mouth daily. 90 tablet 3  . lamoTRIgine (LAMICTAL) 100 MG tablet Take 1 tablet (100 mg total) by mouth daily. Take one a day 30 tablet 1  . modafinil (PROVIGIL) 200 MG tablet Take 200 mg by mouth.    . pregabalin (LYRICA) 200 MG capsule Take 150 mg by mouth 3 (three) times daily.    Marland Kitchen tretinoin (RETIN-A) 0.025 % cream Apply topically at bedtime.    . varenicline (CHANTIX) 0.5 MG tablet Take 0.5 mg by mouth 2 (two) times daily. Unsure of dose    . venlafaxine XR (EFFEXOR-XR) 75 MG 24 hr capsule Take 3 capsules (225 mg total) by mouth daily with breakfast. 90 capsule 1  . aspirin 81 MG tablet Take 81 mg by mouth daily. Reported on 09/28/2015     No current facility-administered medications for this visit.     Previous Psychotropic Medications: Yes  Depakote. ativan  Substance Abuse History in the last 12 months:  Yes.    Marijuana weekly as per history . But says not using anymore for last month or so.      Treatment Plan  Summary: Medication management and Plan as follows   Grief:  Continue effexor. Will print out wellbutrin she will match medications at home and call back . Advised not to take at night as can cause insomnia. Not sure what dose she is taking It has helped some.  Be around supportive family and friends.   Bipolar: continue  lamictal 100mg .   No rash.  Anxiety: continue effexor. Has therapy appointment tomorrow, encouraged to keep that so to work on grief and CBT,.   Fibromyalgia: patient on lyrica   Medical complexity: Neuropathy, fibromyalgia, other medical condition may contribute to her mood.  Marijuana use: patient need to abstain from use Patient is currently under therapy. Reviewed sleep hygiene. Patient is a smoker light smoker is wanted cut down but is not ready to quit counseling done More than 50% spent in counseling and coordination including patient education Call 911 or report to local emergency room for any urgent concerns or suicidal thoughts Follow-up in 3-4 weeks or earlier if needed Time spent: 25 minutes   Racine Erby 8/31/20174:08 PM

## 2015-12-03 ENCOUNTER — Ambulatory Visit (HOSPITAL_COMMUNITY): Payer: Self-pay | Admitting: Psychiatry

## 2016-01-04 ENCOUNTER — Other Ambulatory Visit: Payer: Self-pay | Admitting: *Deleted

## 2016-01-04 DIAGNOSIS — B20 Human immunodeficiency virus [HIV] disease: Secondary | ICD-10-CM

## 2016-01-04 MED ORDER — ATAZANAVIR-COBICISTAT 300-150 MG PO TABS: 1.0000 | ORAL_TABLET | Freq: Every day | ORAL | 0 refills | Status: DC

## 2016-01-04 MED ORDER — EMTRICITABINE-TENOFOVIR AF 200-25 MG PO TABS: 1.0000 | ORAL_TABLET | Freq: Every day | ORAL | 0 refills | Status: DC

## 2016-01-28 ENCOUNTER — Ambulatory Visit (HOSPITAL_COMMUNITY): Payer: Self-pay | Admitting: Psychiatry

## 2016-02-10 ENCOUNTER — Ambulatory Visit (INDEPENDENT_AMBULATORY_CARE_PROVIDER_SITE_OTHER): Payer: Medicare HMO | Admitting: Psychiatry

## 2016-02-10 ENCOUNTER — Encounter (HOSPITAL_COMMUNITY): Payer: Self-pay | Admitting: Psychiatry

## 2016-02-10 VITALS — BP 118/74 | Wt 172.0 lb

## 2016-02-10 DIAGNOSIS — F4321 Adjustment disorder with depressed mood: Secondary | ICD-10-CM

## 2016-02-10 DIAGNOSIS — Z8249 Family history of ischemic heart disease and other diseases of the circulatory system: Secondary | ICD-10-CM

## 2016-02-10 DIAGNOSIS — F4320 Adjustment disorder, unspecified: Secondary | ICD-10-CM | POA: Diagnosis not present

## 2016-02-10 DIAGNOSIS — F3132 Bipolar disorder, current episode depressed, moderate: Secondary | ICD-10-CM | POA: Diagnosis not present

## 2016-02-10 DIAGNOSIS — F411 Generalized anxiety disorder: Secondary | ICD-10-CM | POA: Diagnosis not present

## 2016-02-10 DIAGNOSIS — F17210 Nicotine dependence, cigarettes, uncomplicated: Secondary | ICD-10-CM

## 2016-02-10 DIAGNOSIS — Z818 Family history of other mental and behavioral disorders: Secondary | ICD-10-CM

## 2016-02-10 MED ORDER — BUPROPION HCL 100 MG PO TABS: 100.0000 mg | ORAL_TABLET | Freq: Every day | ORAL | 1 refills | Status: DC

## 2016-02-10 MED ORDER — LAMOTRIGINE 100 MG PO TABS: 100.0000 mg | ORAL_TABLET | Freq: Every day | ORAL | 1 refills | Status: DC

## 2016-02-10 MED ORDER — VENLAFAXINE HCL ER 75 MG PO CP24: 225.0000 mg | ORAL_CAPSULE | Freq: Every day | ORAL | 1 refills | Status: DC

## 2016-02-10 NOTE — Progress Notes (Signed)
Patient ID: Latoya Wilson, female   DOB: 1960/07/05, 55 y.o.   MRN: [id number]  Psychiatric Outpatient Follow up visit Patient Identification: Latoya Wilson MRN:  [id number] Date of Evaluation:  02/10/2016 Referral Source: Latoya Wilson office Chief Complaint:   Chief Complaint    Follow-up     Visit Diagnosis:    ICD-9-CM ICD-10-CM   1. Bipolar affective disorder, currently depressed, moderate ([place]) 296.52 F31.32   2. GAD (generalized anxiety disorder) 300.02 F41.1   3. Grief 309.0 F43.20    Diagnosis:   Patient Active Problem List   Diagnosis Date Noted  . Panic disorder [F41.0] 04/15/2015  . Bipolar affective disorder, currently depressed, moderate ([place]) [F31.32] 04/15/2015  . Tobacco use disorder [F17.200] 02/18/2015  . Metrorrhagia [N92.1] 05/05/2014  . Pure hypercholesterolemia [E78.00] 05/05/2014  . Fibrositis [M79.7] 07/10/2012  . Apnea, sleep [G47.30] 04/04/2012  . CONTACT DERMATITIS [L25.9] 05/13/2010  . LACERATION OF FINGER [S61.209A] 03/22/2010  . BACK PAIN, LUMBAR, WITH RADICULOPATHY [IMO0002] 01/21/2010  . Essential hypertension [I10] 01/10/2010  . SINUSITIS, CHRONIC [J32.9] 01/07/2010  . PLANTAR WART, RIGHT [B07.0] 10/25/2009  . MEMORY LOSS [R41.3] 06/09/2009  . RESTLESS LEGS SYNDROME [G25.81] 03/03/2009  . 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 BRONCHITIS [J40] 02/24/2009  . NEVUS [D23.9] 03/18/2008  . ABSCESS, TOOTH [K04.7] 12/27/2007  . LESION, VAGINA [D49.59] 06/28/2007  . TENDINITIS [M77.9] 04/19/2007  . Human immunodeficiency virus (HIV) disease ([place]) [B20] 04/11/2007  . ANEMIA, VITAMIN B12 DEFICIENCY [D51.8] 12/13/2006  . HX, PERSONAL, TOBACCO USE [Z87.891] 12/13/2006  . MUSCLE PAIN [IMO0001] 09/10/2006  . 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 ASTHMA [J45.909] 07/24/2006   History of Present Illness:  Patient is a 55  years old currently single Caucasian female  diagnosed with fibromyalgia and depression possible bipolar,   on  disability because of fibromyalgia, Bipolar  and nerve damage in the past that has affected her memory.   Returns for follow up and med management.  Bipolar: on lamictal. No rash. Mood at times more down but relavant to grief and stressors.  Grief: ongoing but somewhat better  wellbutrin helps depression and being around people.   She does have narcolepsy and is on Modafanil.   Says she used maijuna . Uses sparingly.   Aggravating factor; son's death. her medical complexity, fibromyalgia, neuropathy, finances. Her daughter is 29 years of age transferred gender. Modifying factors; her kids, her mom. Location: depression and mood swings. Anxiety and tiredness Qualtity: flucttuates . It got worse in winter. Now moderate Severity: 6 /10. 10 being no depression .  Marijuana use: weekly for pain as per history but now says not using. Alcohol use: denies Associated symptoms:   Excessive daytime sleepiness. There is no associated psychotic symptoms or paranoia.   (Hypo) Manic Symptoms:  Distractibility, Anxiety Symptoms:  Excessive Worry, not worsened Psychotic Symptoms:  denies PTSD Symptoms: NA  Past Medical History:  Past Medical History:  Diagnosis Date  . Bipolar affective disorder ([place])   . HIV infection ([place])   . Hyperlipidemia   . Hypertension   . Narcolepsy   . Neuropathy ([place])   . Restless leg syndrome   . Seizures ([place])   . Sleep apnea     Past Surgical History:  Procedure Laterality Date  . SHOULDER SURGERY     Family History:  Family History  Problem Relation Age of Onset  . Mitral valve prolapse Mother   . Hyperlipidemia Mother   . Heart disease Father     MI died  at 62  . Bipolar disorder Sister   . Depression Sister   . Depression Brother    Social History:   Social History   Social History  . Marital status: Divorced    Spouse name: N/A  . Number of children: N/A  . Years of education: N/A   Social History Main Topics  . Smoking status:  Heavy Tobacco Smoker    Packs/day: 2.50    Years: 35.00    Types: Cigarettes  . Smokeless tobacco: Never Used  . Alcohol use No  . Drug use: No  . Sexual activity: Yes    Partners: Male    Birth control/ protection: Condom     Comment: pt. given condoms   Other Topics Concern  . None   Social History Narrative  . None    Musculoskeletal: Strength & Muscle Tone: within normal limits Gait & Station: normal Patient leans: Front  Psychiatric Specialty Exam: HPI  Review of Systems  Constitutional: Positive for malaise/fatigue. Negative for fever.  Cardiovascular: Negative for palpitations.  Skin: Negative for rash.  Neurological: Negative for tremors and headaches.  Psychiatric/Behavioral: Negative for hallucinations and suicidal ideas.    Blood pressure 118/74, weight 172 lb (78 kg).Body mass index is 28.62 kg/m.  General Appearance: Casual  Eye Contact:  Fair  Speech:  Slow  Volume:  Normal  Mood:  Somewhat better but still sad not hopeless   Affect:  reactive  Thought Process:  Coherent  Orientation:  Full (Time, Place, and Person)  Thought Content:  Rumination  Suicidal Thoughts:  No  Homicidal Thoughts:  No  Memory:  Immediate;   Fair Recent;   Fair  Judgement:  Fair  Insight:  Shallow  Psychomotor Activity:  Normal  Concentration:  Fair  Recall:  Poor  Fund of Knowledge:Fair  Language: Fair  Akathisia:  Negative  Handed:  Right  AIMS (if indicated):    Assets:  Desire for Improvement  ADL's:  Intact  Cognition: Impaired,  Mild  Sleep:  fair    Is the patient at risk to self?  No. Has the patient been a risk to self in the past 6 months?  No. Has the patient been a risk to self within the distant past?  No.    Allergies:   Allergies  Allergen Reactions  . Oxycodone Other (See Comments)    Photosensitivity and nausea and vomiting seizures   Current Medications: Current Outpatient Prescriptions  Medication Sig Dispense Refill  . albuterol  (PROVENTIL) (2.5 MG/3ML) 0.083% nebulizer solution Take 2.5 mg by nebulization every 6 (six) hours as needed for wheezing or shortness of breath.    Marland Kitchen amLODipine (NORVASC) 10 MG tablet Take 10 mg by mouth daily.    Marland Kitchen aspirin 81 MG tablet Take 81 mg by mouth daily. Reported on 09/28/2015    . atazanavir-cobicistat (EVOTAZ) 300-150 MG tablet Take 1 tablet by mouth daily. Swallow whole. Do NOT crush, cut or chew tablet. Take with food. 90 tablet 0  . beclomethasone (QVAR) 80 MCG/ACT inhaler Inhale 2 puffs into the lungs as needed.    Marland Kitchen buPROPion (WELLBUTRIN) 100 MG tablet Take 1 tablet (100 mg total) by mouth daily. 30 tablet 1  . cyanocobalamin (,VITAMIN B-12,) 1000 MCG/ML injection Inject 1,000 mcg into the muscle every 30 (thirty) days. Around the 5th of every month    . emtricitabine-tenofovir AF (DESCOVY) 200-25 MG tablet Take 1 tablet by mouth daily. 90 tablet 0  . lamoTRIgine (LAMICTAL) 100 MG tablet  Take 1 tablet (100 mg total) by mouth daily. Take one a day 30 tablet 1  . modafinil (PROVIGIL) 200 MG tablet Take 200 mg by mouth.    . pregabalin (LYRICA) 200 MG capsule Take 150 mg by mouth 3 (three) times daily.    Marland Kitchen tretinoin (RETIN-A) 0.025 % cream Apply topically at bedtime.    . varenicline (CHANTIX) 0.5 MG tablet Take 0.5 mg by mouth 2 (two) times daily. Unsure of dose    . venlafaxine XR (EFFEXOR-XR) 75 MG 24 hr capsule Take 3 capsules (225 mg total) by mouth daily with breakfast. 90 capsule 1   No current facility-administered medications for this visit.       Treatment Plan Summary: Medication management and Plan as follows   Grief:  Continue effexor and wellbutrin. Printed prescriptions.  Be around supportive family and friends.  No change in medications done.  Bipolar: continue  lamictal 100mg .   No rash.  Anxiety: continue effexor..   Fibromyalgia: patient on lyrica   Medical complexity: Neuropathy, fibromyalgia, other medical condition may contribute to her mood.   Marijuana use: patient need to abstain from use Patient is currently under therapy. Reviewed sleep hygiene. Patient is a smoker light smoker is wanted cut down but is not ready to quit counseling done More than 50% spent in counseling and coordination including patient education and individual therapy for coping skills.  Call 911 or report to local emergency room for any urgent concerns or suicidal thoughts Follow-up in 2 months or earlier if needed Time spent face to face : 25 minutes   Emmali Karow 11/9/20171:15 PM

## 2016-04-07 ENCOUNTER — Encounter (HOSPITAL_COMMUNITY): Payer: Self-pay | Admitting: Psychiatry

## 2016-04-07 ENCOUNTER — Ambulatory Visit (INDEPENDENT_AMBULATORY_CARE_PROVIDER_SITE_OTHER): Payer: Medicare HMO | Admitting: Psychiatry

## 2016-04-07 DIAGNOSIS — F4320 Adjustment disorder, unspecified: Secondary | ICD-10-CM | POA: Diagnosis not present

## 2016-04-07 DIAGNOSIS — F3132 Bipolar disorder, current episode depressed, moderate: Secondary | ICD-10-CM | POA: Diagnosis not present

## 2016-04-07 DIAGNOSIS — Z818 Family history of other mental and behavioral disorders: Secondary | ICD-10-CM

## 2016-04-07 DIAGNOSIS — F410 Panic disorder [episodic paroxysmal anxiety] without agoraphobia: Secondary | ICD-10-CM | POA: Diagnosis not present

## 2016-04-07 DIAGNOSIS — F411 Generalized anxiety disorder: Secondary | ICD-10-CM | POA: Diagnosis not present

## 2016-04-07 DIAGNOSIS — Z8249 Family history of ischemic heart disease and other diseases of the circulatory system: Secondary | ICD-10-CM

## 2016-04-07 DIAGNOSIS — F17210 Nicotine dependence, cigarettes, uncomplicated: Secondary | ICD-10-CM

## 2016-04-07 DIAGNOSIS — F4321 Adjustment disorder with depressed mood: Secondary | ICD-10-CM

## 2016-04-07 DIAGNOSIS — Z8489 Family history of other specified conditions: Secondary | ICD-10-CM

## 2016-04-07 MED ORDER — VENLAFAXINE HCL ER 75 MG PO CP24: 225.0000 mg | ORAL_CAPSULE | Freq: Every day | ORAL | 1 refills | Status: DC

## 2016-04-07 MED ORDER — BUPROPION HCL 100 MG PO TABS
200.0000 mg | ORAL_TABLET | Freq: Every day | ORAL | 1 refills | Status: DC
Start: 2016-04-07 — End: 2016-06-01

## 2016-04-07 MED ORDER — LAMOTRIGINE 100 MG PO TABS: 100.0000 mg | ORAL_TABLET | Freq: Every day | ORAL | 1 refills | Status: DC

## 2016-04-07 NOTE — Progress Notes (Signed)
Patient ID: Latoya Wilson, female   DOB: [birth year], 56 y.o.   MRN: [id number]  Psychiatric Outpatient Follow up visit Patient Identification: Latoya Wilson MRN:  [id number] Date of Evaluation:  04/07/2016 Referral Source: Latoya Wilson office Chief Complaint:   Chief Complaint    Follow-up     Visit Diagnosis:    ICD-9-CM ICD-10-CM   1. Bipolar affective disorder, currently depressed, moderate ([place]) 296.52 F31.32   2. GAD (generalized anxiety disorder) 300.02 F41.1   3. Grief 309.0 F43.20   4. Panic disorder 300.01 F41.0    Diagnosis:   Patient Active Problem List   Diagnosis Date Noted  . Panic disorder [F41.0] 04/15/2015  . Bipolar affective disorder, currently depressed, moderate ([place]) [F31.32] 04/15/2015  . Tobacco use disorder [F17.200] 02/18/2015  . Metrorrhagia [N92.1] 05/05/2014  . Pure hypercholesterolemia [E78.00] 05/05/2014  . Fibrositis [M79.7] 07/10/2012  . Apnea, sleep [G47.30] 04/04/2012  . CONTACT DERMATITIS [L25.9] 05/13/2010  . LACERATION OF FINGER [S61.209A] 03/22/2010  . BACK PAIN, LUMBAR, WITH RADICULOPATHY [IMO0002] 01/21/2010  . Essential hypertension [I10] 01/10/2010  . SINUSITIS, CHRONIC [J32.9] 01/07/2010  . PLANTAR WART, RIGHT [B07.0] 10/25/2009  . MEMORY LOSS [R41.3] 06/09/2009  . RESTLESS LEGS SYNDROME [G25.81] 03/03/2009  . 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 BRONCHITIS [J40] 02/24/2009  . NEVUS [D23.9] 03/18/2008  . ABSCESS, TOOTH [K04.7] 12/27/2007  . LESION, VAGINA [D49.59] 06/28/2007  . TENDINITIS [M77.9] 04/19/2007  . Human immunodeficiency virus (HIV) disease ([place]) [B20] 04/11/2007  . ANEMIA, VITAMIN B12 DEFICIENCY [D51.8] 12/13/2006  . HX, PERSONAL, TOBACCO USE [Z87.891] 12/13/2006  . MUSCLE PAIN [IMO0001] 09/10/2006  . 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 ASTHMA [J45.909] 07/24/2006   History of Present Illness:  Patient is a 56  years old currently single Caucasian female  diagnosed with fibromyalgia and  depression possible bipolar,   on disability because of fibromyalgia, Bipolar  and nerve damage in the past that has affected her memory.    Patient remains depressed regarding her son's that. She is having difficulty dealing with her grief. Her son's girlfriend is also using drugs and she gives the patient hard time as well. She continues to go Lamictal and Wellbutrin but despite that she doesn't have anxiety and depression    She does have narcolepsy and is on Modafanil.   Says she used maijuna . Uses sparingly and says have not used for a while now.   Aggravating factor; son's death. her medical complexity, fibromyalgia, neuropathy, finances. Her daughter is 73 years of age transferred gender. Modifying factors; her kids, her mom. Location: depression and mood swings. Anxiety and tiredness Qualtity: flucttuates . It got worse in winter. Now moderate Severity: 5 /10. 10 being no depression .    Past Medical History:  Past Medical History:  Diagnosis Date  . Bipolar affective disorder ([place])   . HIV infection ([place])   . Hyperlipidemia   . Hypertension   . Narcolepsy   . Neuropathy ([place])   . Restless leg syndrome   . Seizures ([place])   . Sleep apnea     Past Surgical History:  Procedure Laterality Date  . SHOULDER SURGERY     Family History:  Family History  Problem Relation Age of Onset  . Mitral valve prolapse Mother   . Hyperlipidemia Mother   . Heart disease Father     MI died at 15  . Bipolar disorder Sister   . Depression Sister   . Depression Brother    Social History:  Social History   Social History  . Marital status: Divorced    Spouse name: N/A  . Number of children: N/A  . Years of education: N/A   Social History Main Topics  . Smoking status: Heavy Tobacco Smoker    Packs/day: 2.50    Years: 35.00    Types: Cigarettes  . Smokeless tobacco: Never Used  . Alcohol use No  . Drug use: No  . Sexual activity: Yes    Partners: Male    Birth  control/ protection: Condom     Comment: pt. given condoms   Other Topics Concern  . None   Social History Narrative  . None      Psychiatric Specialty Exam: HPI  Review of Systems  Constitutional: Positive for malaise/fatigue. Negative for fever.  Cardiovascular: Negative for chest pain.  Skin: Negative for rash.  Neurological: Negative for tremors and headaches.  Psychiatric/Behavioral: Negative for hallucinations and suicidal ideas.    There were no vitals taken for this visit.There is no height or weight on file to calculate BMI.  General Appearance: Casual  Eye Contact:  Fair  Speech:  Slow  Volume:  Normal  Mood:  dysthymic   Affect:  reactive  Thought Process:  Coherent  Orientation:  Full (Time, Place, and Person)  Thought Content:  Rumination  Suicidal Thoughts:  No  Homicidal Thoughts:  No  Memory:  Immediate;   Fair Recent;   Fair  Judgement:  Fair  Insight:  Shallow  Psychomotor Activity:  Normal  Concentration:  Fair  Recall:  Poor  Fund of Knowledge:Fair  Language: Fair  Akathisia:  Negative  Handed:  Right  AIMS (if indicated):    Assets:  Desire for Improvement  ADL's:  Intact  Cognition: Impaired,  Mild  Sleep:  fair        Allergies:   Allergies  Allergen Reactions  . Oxycodone Other (See Comments)    Photosensitivity and nausea and vomiting seizures   Current Medications: Current Outpatient Prescriptions  Medication Sig Dispense Refill  . albuterol (PROVENTIL) (2.5 MG/3ML) 0.083% nebulizer solution Take 2.5 mg by nebulization every 6 (six) hours as needed for wheezing or shortness of breath.    Marland Kitchen amLODipine (NORVASC) 10 MG tablet Take 10 mg by mouth daily.    Marland Kitchen aspirin 81 MG tablet Take 81 mg by mouth daily. Reported on 09/28/2015    . atazanavir-cobicistat (EVOTAZ) 300-150 MG tablet Take 1 tablet by mouth daily. Swallow whole. Do NOT crush, cut or chew tablet. Take with food. 90 tablet 0  . beclomethasone (QVAR) 80 MCG/ACT  inhaler Inhale 2 puffs into the lungs as needed.    Marland Kitchen buPROPion (WELLBUTRIN) 100 MG tablet Take 2 tablets (200 mg total) by mouth daily. 60 tablet 1  . cyanocobalamin (,VITAMIN B-12,) 1000 MCG/ML injection Inject 1,000 mcg into the muscle every 30 (thirty) days. Around the 5th of every month    . emtricitabine-tenofovir AF (DESCOVY) 200-25 MG tablet Take 1 tablet by mouth daily. 90 tablet 0  . lamoTRIgine (LAMICTAL) 100 MG tablet Take 1 tablet (100 mg total) by mouth daily. Take one a day 30 tablet 1  . modafinil (PROVIGIL) 200 MG tablet Take 200 mg by mouth.    . pregabalin (LYRICA) 200 MG capsule Take 150 mg by mouth 3 (three) times daily.    Marland Kitchen tretinoin (RETIN-A) 0.025 % cream Apply topically at bedtime.    . varenicline (CHANTIX) 0.5 MG tablet Take 0.5 mg by mouth 2 (two)  times daily. Unsure of dose    . venlafaxine XR (EFFEXOR-XR) 75 MG 24 hr capsule Take 3 capsules (225 mg total) by mouth daily with breakfast. 90 capsule 1   No current facility-administered medications for this visit.       Treatment Plan Summary: Medication management and Plan as follows   Grief:  Continue effexor. Will increase wellbutrin to 200mg  qd   Bipolar: continue lamictal. No rash.  Anxiety:continue effexor. Anxiety fluctuates Avoid marijuana use and consider therapy.    Fibromyalgia: patient on lyrica  Medical complexity: Neuropathy, fibromyalgia, other medical condition may contribute to her mood.  FU 2 months. Reviewed side effects and concern. She may come early if needed.    Lousie Calico 1/5/201812:33 PM

## 2016-04-18 ENCOUNTER — Other Ambulatory Visit: Payer: Self-pay

## 2016-05-02 ENCOUNTER — Encounter: Payer: Self-pay | Admitting: Infectious Diseases

## 2016-05-02 ENCOUNTER — Ambulatory Visit (INDEPENDENT_AMBULATORY_CARE_PROVIDER_SITE_OTHER): Payer: Medicare HMO | Admitting: Infectious Diseases

## 2016-05-02 ENCOUNTER — Other Ambulatory Visit (HOSPITAL_COMMUNITY)
Admission: RE | Admit: 2016-05-02 | Discharge: 2016-05-02 | Disposition: A | Discharge status: Home / Self Care | Payer: Medicare HMO | Source: Ambulatory Visit | Attending: Infectious Diseases | Admitting: Infectious Diseases

## 2016-05-02 VITALS — BP 148/83 | HR 105 | Temp 97.4°F | Wt 173.0 lb

## 2016-05-02 DIAGNOSIS — F17200 Nicotine dependence, unspecified, uncomplicated: Secondary | ICD-10-CM

## 2016-05-02 DIAGNOSIS — Z113 Encounter for screening for infections with a predominantly sexual mode of transmission: Secondary | ICD-10-CM | POA: Insufficient documentation

## 2016-05-02 DIAGNOSIS — Z23 Encounter for immunization: Secondary | ICD-10-CM

## 2016-05-02 DIAGNOSIS — F3132 Bipolar disorder, current episode depressed, moderate: Secondary | ICD-10-CM

## 2016-05-02 DIAGNOSIS — B20 Human immunodeficiency virus [HIV] disease: Secondary | ICD-10-CM | POA: Diagnosis not present

## 2016-05-02 DIAGNOSIS — Z79899 Other long term (current) drug therapy: Secondary | ICD-10-CM

## 2016-05-02 LAB — LIPID PANEL
CHOLESTEROL: 194 mg/dL (ref <200)
HDL: 59 mg/dL (ref >50)
LDL CALC: 107 mg/dL — AB (ref <100)
Total CHOL/HDL Ratio: 3.3 Ratio (ref <5.0)
Triglycerides: 138 mg/dL (ref <150)
VLDL: 28 mg/dL (ref <30)

## 2016-05-02 LAB — COMPREHENSIVE METABOLIC PANEL
ALT: 15 U/L (ref 6–29)
AST: 23 U/L (ref 10–35)
Albumin: 4.3 g/dL (ref 3.6–5.1)
Alkaline Phosphatase: 128 U/L (ref 33–130)
BILIRUBIN TOTAL: 0.5 mg/dL (ref 0.2–1.2)
BUN: 18 mg/dL (ref 7–25)
CALCIUM: 9.6 mg/dL (ref 8.6–10.4)
CHLORIDE: 102 mmol/L (ref 98–110)
CO2: 27 mmol/L (ref 20–31)
CREATININE: 1.01 mg/dL (ref 0.50–1.05)
Glucose, Bld: 95 mg/dL (ref 65–99)
Potassium: 3.9 mmol/L (ref 3.5–5.3)
SODIUM: 140 mmol/L (ref 135–146)
TOTAL PROTEIN: 7.1 g/dL (ref 6.1–8.1)

## 2016-05-02 LAB — CBC
HEMATOCRIT: 44.1 % (ref 35.0–45.0)
HEMOGLOBIN: 15.4 g/dL (ref 11.7–15.5)
MCH: 31 pg (ref 27.0–33.0)
MCHC: 34.9 g/dL (ref 32.0–36.0)
MCV: 88.7 fL (ref 80.0–100.0)
MPV: 11.4 fL (ref 7.5–12.5)
Platelets: 226 10*3/uL (ref 140–400)
RBC: 4.97 MIL/uL (ref 3.80–5.10)
RDW: 13.5 % (ref 11.0–15.0)
WBC: 5.9 10*3/uL (ref 3.8–10.8)

## 2016-05-02 MED ORDER — ATAZANAVIR-COBICISTAT 300-150 MG PO TABS: 1.0000 | ORAL_TABLET | Freq: Every day | ORAL | 4 refills | Status: DC

## 2016-05-02 MED ORDER — EMTRICITABINE-TENOFOVIR AF 200-25 MG PO TABS: 1.0000 | ORAL_TABLET | Freq: Every day | ORAL | 0 refills | Status: DC

## 2016-05-02 NOTE — Assessment & Plan Note (Signed)
Encouraged her to stop smoking.

## 2016-05-02 NOTE — Assessment & Plan Note (Addendum)
Get labs today.  Get flu shot today Get her mammo, pap.  Prev Hep B vax x 2.  See her back in 4 months due to mental health issues.
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
## 2016-05-02 NOTE — Assessment & Plan Note (Signed)
Will get her f/u with Grayland Ormond due to grief.  She will f/u with her psych as well.

## 2016-05-03 LAB — RPR

## 2016-05-04 ENCOUNTER — Ambulatory Visit: Payer: Medicare HMO

## 2016-05-04 DIAGNOSIS — F3160 Bipolar disorder, current episode mixed, unspecified: Secondary | ICD-10-CM

## 2016-05-04 LAB — URINE CYTOLOGY ANCILLARY ONLY
Chlamydia: NEGATIVE
NEISSERIA GONORRHEA: NEGATIVE

## 2016-05-04 LAB — HIV-1 RNA QUANT-NO REFLEX-BLD
HIV 1 RNA QUANT: DETECTED {copies}/mL — AB
HIV-1 RNA QUANT, LOG: DETECTED {Log_copies}/mL — AB

## 2016-05-04 LAB — T-HELPER CELL (CD4) - (RCID CLINIC ONLY)
CD4 % Helper T Cell: 46 % (ref 33–55)
CD4 T Cell Abs: 1000 /uL (ref 400–2700)
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
## 2016-05-17 ENCOUNTER — Ambulatory Visit: Payer: Medicare HMO

## 2016-05-17 DIAGNOSIS — F3160 Bipolar disorder, current episode mixed, unspecified: Secondary | ICD-10-CM
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
## 2016-05-31 ENCOUNTER — Ambulatory Visit: Payer: Medicare HMO

## 2016-05-31 DIAGNOSIS — F3160 Bipolar disorder, current episode mixed, unspecified: Secondary | ICD-10-CM
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
## 2016-06-01 ENCOUNTER — Ambulatory Visit (HOSPITAL_COMMUNITY): Payer: Self-pay | Admitting: Psychiatry

## 2016-06-01 ENCOUNTER — Ambulatory Visit (INDEPENDENT_AMBULATORY_CARE_PROVIDER_SITE_OTHER): Payer: Medicare HMO | Admitting: Psychiatry

## 2016-06-01 ENCOUNTER — Encounter (HOSPITAL_COMMUNITY): Payer: Self-pay | Admitting: Psychiatry

## 2016-06-01 VITALS — BP 128/80 | HR 100 | Resp 16 | Ht 65.0 in | Wt 177.0 lb

## 2016-06-01 DIAGNOSIS — F410 Panic disorder [episodic paroxysmal anxiety] without agoraphobia: Secondary | ICD-10-CM | POA: Diagnosis not present

## 2016-06-01 DIAGNOSIS — F3132 Bipolar disorder, current episode depressed, moderate: Secondary | ICD-10-CM

## 2016-06-01 DIAGNOSIS — Z79899 Other long term (current) drug therapy: Secondary | ICD-10-CM

## 2016-06-01 DIAGNOSIS — F4321 Adjustment disorder with depressed mood: Secondary | ICD-10-CM

## 2016-06-01 DIAGNOSIS — F17210 Nicotine dependence, cigarettes, uncomplicated: Secondary | ICD-10-CM

## 2016-06-01 DIAGNOSIS — Z818 Family history of other mental and behavioral disorders: Secondary | ICD-10-CM

## 2016-06-01 DIAGNOSIS — F411 Generalized anxiety disorder: Secondary | ICD-10-CM | POA: Diagnosis not present

## 2016-06-01 DIAGNOSIS — Z888 Allergy status to other drugs, medicaments and biological substances status: Secondary | ICD-10-CM

## 2016-06-01 DIAGNOSIS — Z7982 Long term (current) use of aspirin: Secondary | ICD-10-CM

## 2016-06-01 DIAGNOSIS — F4320 Adjustment disorder, unspecified: Secondary | ICD-10-CM

## 2016-06-01 MED ORDER — VENLAFAXINE HCL ER 75 MG PO CP24: 225.0000 mg | ORAL_CAPSULE | Freq: Every day | ORAL | 1 refills | Status: DC

## 2016-06-01 MED ORDER — LAMOTRIGINE 100 MG PO TABS: 100.0000 mg | ORAL_TABLET | Freq: Every day | ORAL | 1 refills | Status: DC

## 2016-06-01 MED ORDER — BUPROPION HCL 100 MG PO TABS
200.0000 mg | ORAL_TABLET | Freq: Every day | ORAL | 1 refills | Status: DC
Start: 2016-06-01 — End: 2016-08-25

## 2016-06-01 NOTE — Progress Notes (Signed)
Patient ID: Latoya Wilson, female   DOB: [birth year], 56 y.o.   MRN: [id number]  Psychiatric Outpatient Follow up visit Patient Identification: [name] MRN:  [id number] Date of Evaluation:  06/01/2016 Referral Source: Tacy Dura office Chief Complaint:   Chief Complaint    Follow-up     Visit Diagnosis:    ICD-9-CM ICD-10-CM   1. Bipolar affective disorder, currently depressed, moderate ([place]) 296.52 F31.32   2. GAD (generalized anxiety disorder) 300.02 F41.1   3. Grief 309.0 F43.20   4. Panic disorder 300.01 F41.0    Diagnosis:   Patient Active Problem List   Diagnosis Date Noted  . Panic disorder [F41.0] 04/15/2015  . Bipolar affective disorder, currently depressed, moderate ([place]) [F31.32] 04/15/2015  . Tobacco use disorder [F17.200] 02/18/2015  . Metrorrhagia [N92.1] 05/05/2014  . Pure hypercholesterolemia [E78.00] 05/05/2014  . Fibrositis [M79.7] 07/10/2012  . Apnea, sleep [G47.30] 04/04/2012  . CONTACT DERMATITIS [L25.9] 05/13/2010  . LACERATION OF FINGER [S61.209A] 03/22/2010  . BACK PAIN, LUMBAR, WITH RADICULOPATHY [IMO0002] 01/21/2010  . Essential hypertension [I10] 01/10/2010  . SINUSITIS, CHRONIC [J32.9] 01/07/2010  . PLANTAR WART, RIGHT [B07.0] 10/25/2009  . MEMORY LOSS [R41.3] 06/09/2009  . RESTLESS LEGS SYNDROME [G25.81] 03/03/2009  . 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 BRONCHITIS [J40] 02/24/2009  . NEVUS [D23.9] 03/18/2008  . ABSCESS, TOOTH [K04.7] 12/27/2007  . LESION, VAGINA [D49.59] 06/28/2007  . TENDINITIS [M77.9] 04/19/2007  . Human immunodeficiency virus (HIV) disease ([place]) [B20] 04/11/2007  . ANEMIA, VITAMIN B12 DEFICIENCY [D51.8] 12/13/2006  . MUSCLE PAIN [IMO0001] 09/10/2006  . 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 ASTHMA [J45.909] 07/24/2006   History of Present Illness:  Patient is a 56  years old currently single Caucasian female  diagnosed with fibromyalgia and depression possible bipolar,   on disability because  of fibromyalgia, Bipolar  and nerve damage in the past that has affected her memory.    Patient is lost her son last year finally. To report says that it was not an overdose it was accidental using or snorting drug as he had pneumonia and that may have complicated. She feels somewhat relieved having a closure and an answer but overall still grief about her son but she is trying to move forward anxiety is less intense depression is improving but slowly   She does have narcolepsy and is on Modafanil.  marinuana use: sparingly  Aggravating factor; sons death, fibromyalagia Modifying factors; her kids, her mom. Location: depression and mood swings. Anxiety and tiredness Qualtity: flucttuates . It got worse in winter. Now moderate Severity: improved. 6/10. 10 being no depression    Past Medical History:  Past Medical History:  Diagnosis Date  . Bipolar affective disorder ([place])   . HIV infection ([place])   . Hyperlipidemia   . Hypertension   . Narcolepsy   . Neuropathy ([place])   . Restless leg syndrome   . Seizures ([place])   . Sleep apnea     Past Surgical History:  Procedure Laterality Date  . SHOULDER SURGERY     Family History:  Family History  Problem Relation Age of Onset  . Mitral valve prolapse Mother   . Hyperlipidemia Mother   . Heart disease Father     MI died at 43  . Bipolar disorder Sister   . Depression Sister   . Depression Brother    Social History:   Social History   Social History  . Marital status: Divorced    Spouse name: N/A  . Number  of children: N/A  . Years of education: N/A   Social History Main Topics  . Smoking status: Heavy Tobacco Smoker    Packs/day: 0.50    Years: 35.00    Types: Cigarettes  . Smokeless tobacco: Never Used     Comment: chantix  . Alcohol use No  . Drug use: No  . Sexual activity: Yes    Partners: Male    Birth control/ protection: Condom     Comment: pt. given condoms   Other Topics Concern  . None   Social  History Narrative  . None      Psychiatric Specialty Exam: HPI  Review of Systems  Constitutional: Negative for fever.  Cardiovascular: Negative for palpitations.  Skin: Negative for rash.  Neurological: Negative for tremors and headaches.  Psychiatric/Behavioral: Negative for depression, hallucinations and suicidal ideas.    Blood pressure 128/80, pulse 100, resp. rate 16, height 5\' 5"  (1.651 m), weight 177 lb (80.3 kg), SpO2 96 %.Body mass index is 29.45 kg/m.  General Appearance: Casual  Eye Contact:  Fair  Speech:  Slow  Volume:  Normal  Mood:  Somewhat improved   Affect:  reactive  Thought Process:  Coherent  Orientation:  Full (Time, Place, and Person)  Thought Content:  Rumination  Suicidal Thoughts:  No  Homicidal Thoughts:  No  Memory:  Immediate;   Fair Recent;   Fair  Judgement:  Fair  Insight:  Shallow  Psychomotor Activity:  Normal  Concentration:  Fair  Recall:  Poor  Fund of Knowledge:Fair  Language: Fair  Akathisia:  Negative  Handed:  Right  AIMS (if indicated):    Assets:  Desire for Improvement  ADL's:  Intact  Cognition: Impaired,  Mild  Sleep:  fair        Allergies:   Allergies  Allergen Reactions  . Oxycodone Other (See Comments)    Photosensitivity and nausea and vomiting seizures   Current Medications: Current Outpatient Prescriptions  Medication Sig Dispense Refill  . albuterol (PROVENTIL) (2.5 MG/3ML) 0.083% nebulizer solution Take 2.5 mg by nebulization every 6 (six) hours as needed for wheezing or shortness of breath.    Marland Kitchen amLODipine (NORVASC) 10 MG tablet Take 10 mg by mouth daily.    Marland Kitchen aspirin 81 MG tablet Take 81 mg by mouth daily. Reported on 09/28/2015    . atazanavir-cobicistat (EVOTAZ) 300-150 MG tablet Take 1 tablet by mouth daily. Swallow whole. Do NOT crush, cut or chew tablet. Take with food. 90 tablet 4  . beclomethasone (QVAR) 80 MCG/ACT inhaler Inhale 2 puffs into the lungs as needed.    Marland Kitchen buPROPion  (WELLBUTRIN) 100 MG tablet Take 2 tablets (200 mg total) by mouth daily. 60 tablet 1  . cyanocobalamin (,VITAMIN B-12,) 1000 MCG/ML injection Inject 1,000 mcg into the muscle every 30 (thirty) days. Around the 5th of every month    . emtricitabine-tenofovir AF (DESCOVY) 200-25 MG tablet Take 1 tablet by mouth daily. 90 tablet 0  . lamoTRIgine (LAMICTAL) 100 MG tablet Take 1 tablet (100 mg total) by mouth daily. Take one a day 30 tablet 1  . modafinil (PROVIGIL) 200 MG tablet Take 200 mg by mouth.    . pregabalin (LYRICA) 200 MG capsule Take 150 mg by mouth 3 (three) times daily.    Marland Kitchen tretinoin (RETIN-A) 0.025 % cream Apply topically at bedtime.    . varenicline (CHANTIX) 0.5 MG tablet Take 0.5 mg by mouth 2 (two) times daily. Unsure of dose    .  venlafaxine XR (EFFEXOR-XR) 75 MG 24 hr capsule Take 3 capsules (225 mg total) by mouth daily with breakfast. 90 capsule 1   No current facility-administered medications for this visit.       Treatment Plan Summary: Medication management and Plan as follows   Grief:  Improving. Continue effexor   Bipolar: manageable. Continue lamictal  Anxiety:not worsened. Continue effexor Avoid marijuana use and consider therapy.    Fibromyalgia: patient on lyrica  Medical complexity: Neuropathy, fibromyalgia, other medical condition may contribute to her mood.  FU 3 months     Tahjanae Blankenburg 3/1/20184:21 PM

## 2016-06-14 ENCOUNTER — Ambulatory Visit: Payer: Self-pay

## 2016-06-21 ENCOUNTER — Ambulatory Visit: Payer: Self-pay

## 2016-06-26 ENCOUNTER — Other Ambulatory Visit: Payer: Self-pay | Admitting: *Deleted

## 2016-06-26 DIAGNOSIS — B20 Human immunodeficiency virus [HIV] disease: Secondary | ICD-10-CM

## 2016-06-26 MED ORDER — EMTRICITABINE-TENOFOVIR AF 200-25 MG PO TABS: 1.0000 | ORAL_TABLET | Freq: Every day | ORAL | 2 refills | Status: DC

## 2016-06-26 MED ORDER — ATAZANAVIR-COBICISTAT 300-150 MG PO TABS: 1.0000 | ORAL_TABLET | Freq: Every day | ORAL | 2 refills | Status: DC

## 2016-06-29 ENCOUNTER — Ambulatory Visit: Payer: Self-pay

## 2016-08-25 ENCOUNTER — Ambulatory Visit (INDEPENDENT_AMBULATORY_CARE_PROVIDER_SITE_OTHER): Payer: Medicare HMO | Admitting: Psychiatry

## 2016-08-25 ENCOUNTER — Encounter (HOSPITAL_COMMUNITY): Payer: Self-pay | Admitting: Psychiatry

## 2016-08-25 DIAGNOSIS — M797 Fibromyalgia: Secondary | ICD-10-CM

## 2016-08-25 DIAGNOSIS — Z818 Family history of other mental and behavioral disorders: Secondary | ICD-10-CM

## 2016-08-25 DIAGNOSIS — F4321 Adjustment disorder with depressed mood: Secondary | ICD-10-CM

## 2016-08-25 DIAGNOSIS — F410 Panic disorder [episodic paroxysmal anxiety] without agoraphobia: Secondary | ICD-10-CM | POA: Diagnosis not present

## 2016-08-25 DIAGNOSIS — F17210 Nicotine dependence, cigarettes, uncomplicated: Secondary | ICD-10-CM | POA: Diagnosis not present

## 2016-08-25 DIAGNOSIS — F4320 Adjustment disorder, unspecified: Secondary | ICD-10-CM

## 2016-08-25 DIAGNOSIS — F3132 Bipolar disorder, current episode depressed, moderate: Secondary | ICD-10-CM | POA: Diagnosis not present

## 2016-08-25 DIAGNOSIS — F411 Generalized anxiety disorder: Secondary | ICD-10-CM | POA: Diagnosis not present

## 2016-08-25 MED ORDER — BUPROPION HCL 100 MG PO TABS: 200.0000 mg | ORAL_TABLET | Freq: Every day | ORAL | 1 refills | Status: DC

## 2016-08-25 MED ORDER — LAMOTRIGINE 100 MG PO TABS: 100.0000 mg | ORAL_TABLET | Freq: Every day | ORAL | 1 refills | Status: DC

## 2016-08-25 MED ORDER — VENLAFAXINE HCL ER 75 MG PO CP24: 225.0000 mg | ORAL_CAPSULE | Freq: Every day | ORAL | 1 refills | Status: AC

## 2016-08-25 NOTE — Progress Notes (Signed)
Patient ID: Latoya Wilson, female   DOB: 10/27/1960, 56 y.o.   MRN: [id number]  Psychiatric Outpatient Follow up visit Patient Identification: Latoya Wilson MRN:  [id number] Date of Evaluation:  08/25/2016 Referral Source: Latoya Wilson office Chief Complaint:   Chief Complaint    Follow-up     Visit Diagnosis:    ICD-9-CM ICD-10-CM   1. Bipolar affective disorder, currently depressed, moderate (Latoya Wilson) 296.52 F31.32   2. GAD (generalized anxiety disorder) 300.02 F41.1   3. Panic disorder 300.01 F41.0   4. Grief 309.0 F43.20    Diagnosis:   Patient Active Problem List   Diagnosis Date Noted  . Panic disorder [F41.0] 04/15/2015  . Bipolar affective disorder, currently depressed, moderate (Latoya Wilson) [F31.32] 04/15/2015  . Tobacco use disorder [F17.200] 02/18/2015  . Metrorrhagia [N92.1] 05/05/2014  . Pure hypercholesterolemia [E78.00] 05/05/2014  . Fibrositis [M79.7] 07/10/2012  . Apnea, sleep [G47.30] 04/04/2012  . CONTACT DERMATITIS [L25.9] 05/13/2010  . LACERATION OF FINGER [S61.209A] 03/22/2010  . BACK PAIN, LUMBAR, WITH RADICULOPATHY [IMO0002] 01/21/2010  . Essential hypertension [I10] 01/10/2010  . SINUSITIS, CHRONIC [J32.9] 01/07/2010  . PLANTAR WART, RIGHT [B07.0] 10/25/2009  . MEMORY LOSS [R41.3] 06/09/2009  . RESTLESS LEGS SYNDROME [G25.81] 03/03/2009  . 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 BRONCHITIS [J40] 02/24/2009  . NEVUS [D23.9] 03/18/2008  . ABSCESS, TOOTH [K04.7] 12/27/2007  . LESION, VAGINA [D49.59] 06/28/2007  . TENDINITIS [M77.9] 04/19/2007  . Human immunodeficiency virus (HIV) disease ([place]) [B20] 04/11/2007  . ANEMIA, VITAMIN B12 DEFICIENCY [D51.8] 12/13/2006  . MUSCLE PAIN [IMO0001] 09/10/2006  . 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 ASTHMA [J45.909] 07/24/2006   History of Present Illness:  Patient is a 56  years old currently single Caucasian female  diagnosed with fibromyalgia and depression possible bipolar,   on disability  because of fibromyalgia, Bipolar  and nerve damage in the past that has affected her memory.   Patient lost her son last year,  Her grief is getting better, tries to spend time outside in the garden growin plants Has her daughter and BF support system  She is tolerating Lamictal and Effexor and Wellbutrin and is no report of side effects or rash. She has not klutzy for which she gets modafinil otherwise she becomes sleepy and fatigued during the day Aggravating factors son's dad. Fibromyalgia modifying factors her kids  Severity of depression somewhat better now 7/10   Past Medical History:  Past Medical History:  Diagnosis Date  . Bipolar affective disorder ([place])   . HIV infection ([place])   . Hyperlipidemia   . Hypertension   . Narcolepsy   . Neuropathy   . Restless leg syndrome   . Seizures ([place])   . Sleep apnea     Past Surgical History:  Procedure Laterality Date  . SHOULDER SURGERY     Family History:  Family History  Problem Relation Age of Onset  . Mitral valve prolapse Mother   . Hyperlipidemia Mother   . Heart disease Father        MI died at 59  . Bipolar disorder Sister   . Depression Sister   . Depression Brother    Social History:   Social History   Social History  . Marital status: Divorced    Spouse name: N/A  . Number of children: N/A  . Years of education: N/A   Social History Main Topics  . Smoking status: Heavy Tobacco Smoker    Packs/day: 0.50    Years: 35.00  Types: Cigarettes  . Smokeless tobacco: Never Used     Comment: chantix  . Alcohol use No  . Drug use: No  . Sexual activity: Yes    Partners: Male    Birth control/ protection: Condom     Comment: pt. given condoms   Other Topics Concern  . None   Social History Narrative  . None      Psychiatric Specialty Exam: HPI  Review of Systems  Constitutional: Negative for fever.  Cardiovascular: Negative for chest pain.  Skin: Negative for rash.  Neurological: Negative  for tremors and headaches.  Psychiatric/Behavioral: Negative for depression, hallucinations and suicidal ideas.    There were no vitals taken for this visit.There is no height or weight on file to calculate BMI.  General Appearance: Casual  Eye Contact:  Fair  Speech:  Slow  Volume:  Normal  Mood:  fair   Affect:  Reactive and pleasant  Thought Process:  Coherent  Orientation:  Full (Time, Place, and Person)  Thought Content:  Rumination  Suicidal Thoughts:  No  Homicidal Thoughts:  No  Memory:  Immediate;   Fair Recent;   Fair  Judgement:  Fair  Insight:  Shallow  Psychomotor Activity:  Normal  Concentration:  Fair  Recall:  Poor  Fund of Knowledge:Fair  Language: Fair  Akathisia:  Negative  Handed:  Right  AIMS (if indicated):    Assets:  Desire for Improvement  ADL's:  Intact  Cognition: Impaired,  Mild  Sleep:  fair        Allergies:   Allergies  Allergen Reactions  . Oxycodone Other (See Comments)    Photosensitivity and nausea and vomiting seizures   Current Medications: Current Outpatient Prescriptions  Medication Sig Dispense Refill  . albuterol (PROVENTIL) (2.5 MG/3ML) 0.083% nebulizer solution Take 2.5 mg by nebulization every 6 (six) hours as needed for wheezing or shortness of breath.    Marland Kitchen amLODipine (NORVASC) 10 MG tablet Take 10 mg by mouth daily.    Marland Kitchen aspirin 81 MG tablet Take 81 mg by mouth daily. Reported on 09/28/2015    . atazanavir-cobicistat (EVOTAZ) 300-150 MG tablet Take 1 tablet by mouth daily. Swallow whole. Do NOT crush, cut or chew tablet. Take with food. 90 tablet 2  . beclomethasone (QVAR) 80 MCG/ACT inhaler Inhale 2 puffs into the lungs as needed.    Marland Kitchen buPROPion (WELLBUTRIN) 100 MG tablet Take 2 tablets (200 mg total) by mouth daily. 60 tablet 1  . cyanocobalamin (,VITAMIN B-12,) 1000 MCG/ML injection Inject 1,000 mcg into the muscle every 30 (thirty) days. Around the 5th of every month    . emtricitabine-tenofovir AF (DESCOVY)  200-25 MG tablet Take 1 tablet by mouth daily. 90 tablet 2  . lamoTRIgine (LAMICTAL) 100 MG tablet Take 1 tablet (100 mg total) by mouth daily. Take one a day 30 tablet 1  . modafinil (PROVIGIL) 200 MG tablet Take 200 mg by mouth.    . pregabalin (LYRICA) 200 MG capsule Take 150 mg by mouth 3 (three) times daily.    Marland Kitchen tretinoin (RETIN-A) 0.025 % cream Apply topically at bedtime.    . varenicline (CHANTIX) 0.5 MG tablet Take 0.5 mg by mouth 2 (two) times daily. Unsure of dose    . venlafaxine XR (EFFEXOR-XR) 75 MG 24 hr capsule Take 3 capsules (225 mg total) by mouth daily with breakfast. 90 capsule 1   No current facility-administered medications for this visit.       Treatment Plan Summary:  Medication management and Plan as follows   Grief:  Improving. Continue wellbutrin, effexor Bipolar: not worse. Continue lamictal Anxiety: fluctuates has good support system . Continue effexor   Fibromyalgia: patient on lyrica  Provided supportive therapy patient to spend more time outside and that helps her mood follow-up in 3 months. Prescriptions and printed them out  Latoya Wilson 5/25/201811:59 AM

## 2016-08-30 ENCOUNTER — Ambulatory Visit: Payer: Self-pay | Admitting: Licensed Clinical Social Worker

## 2016-08-30 ENCOUNTER — Ambulatory Visit (INDEPENDENT_AMBULATORY_CARE_PROVIDER_SITE_OTHER): Payer: Medicare HMO | Admitting: Licensed Clinical Social Worker

## 2016-08-30 ENCOUNTER — Encounter: Payer: Self-pay | Admitting: Infectious Diseases

## 2016-08-30 ENCOUNTER — Other Ambulatory Visit (HOSPITAL_COMMUNITY)
Admission: RE | Admit: 2016-08-30 | Discharge: 2016-08-30 | Disposition: A | Discharge status: Home / Self Care | Payer: Medicare HMO | Source: Ambulatory Visit | Attending: Infectious Diseases | Admitting: Infectious Diseases

## 2016-08-30 ENCOUNTER — Ambulatory Visit (INDEPENDENT_AMBULATORY_CARE_PROVIDER_SITE_OTHER): Payer: Medicare HMO | Admitting: Infectious Diseases

## 2016-08-30 VITALS — BP 112/75 | HR 108 | Temp 98.8°F | Ht 65.5 in | Wt 179.0 lb

## 2016-08-30 DIAGNOSIS — F3132 Bipolar disorder, current episode depressed, moderate: Secondary | ICD-10-CM | POA: Diagnosis not present

## 2016-08-30 DIAGNOSIS — F17200 Nicotine dependence, unspecified, uncomplicated: Secondary | ICD-10-CM | POA: Diagnosis not present

## 2016-08-30 DIAGNOSIS — B20 Human immunodeficiency virus [HIV] disease: Secondary | ICD-10-CM | POA: Diagnosis present

## 2016-08-30 DIAGNOSIS — Z113 Encounter for screening for infections with a predominantly sexual mode of transmission: Secondary | ICD-10-CM | POA: Insufficient documentation

## 2016-08-30 DIAGNOSIS — F321 Major depressive disorder, single episode, moderate: Secondary | ICD-10-CM

## 2016-08-30 DIAGNOSIS — Z0000 Encounter for general adult medical examination without abnormal findings: Secondary | ICD-10-CM

## 2016-08-30 NOTE — Progress Notes (Signed)
   Subjective:    Patient ID: Latoya Wilson, female    DOB: July 20, 1960, 56 y.o.   MRN: [id number]  HPI 56 yo F with hx of HIV+, HTN, bipolar d/o. Taking ATVr/TRV -->ATVc/Descovy (2016). Had normal PAP march 2017 (previous abn).  Smoking now down to 1 cig/day. Hates the taste but wants to smoke.   Mammo normal 05-2015  Has been feeling dissociated due to loss of her son. Has had psychiatry f/u.  She is also worried about her financial status. Is tearful today. Feels like she hasn't mourned the loss. She is unclear what it will take to complete this process.  "i just don't care anymore". Does get along with her daughter now.    HIV 1 RNA Quant (copies/mL)  Date Value  05/02/2016 <20 DETECTED (A)  05/20/2015 <20  01/27/2015 <20   CD4 T Cell Abs (/uL)  Date Value  05/02/2016 1,000  05/20/2015 490  01/27/2015 1,070    Review of Systems  Constitutional: Negative for appetite change and unexpected weight change.  Respiratory: Negative for cough and shortness of breath.   Cardiovascular: Negative for leg swelling.  Gastrointestinal: Negative for constipation and diarrhea.  Genitourinary: Positive for frequency. Negative for difficulty urinating.  Musculoskeletal: Positive for arthralgias.  Psychiatric/Behavioral: Positive for dysphoric mood.   Has gone back to gym. Walking 35 min/day. Had L shoulder arthrogram. Cortisone injection.     Objective:   Physical Exam  Constitutional: She appears well-developed and well-nourished.  HENT:  Mouth/Throat: No oropharyngeal exudate.  Eyes: EOM are normal. Pupils are equal, round, and reactive to light.  Neck: Neck supple.  Cardiovascular: Normal rate, regular rhythm and normal heart sounds.   Pulmonary/Chest: Effort normal and breath sounds normal.  Abdominal: Soft. Bowel sounds are normal. There is no tenderness. There is no rebound.  Musculoskeletal: She exhibits no edema.  Lymphadenopathy:    She has no cervical adenopathy.    Psychiatric: Her speech is rapid and/or pressured. She exhibits a depressed mood.      Assessment & Plan:

## 2016-08-30 NOTE — Assessment & Plan Note (Signed)
She is doing ok Will check her labs today Partner is (-). Gets screened, uses condoms.  No change in ART Will see her back in 6 months.  Needs mammo, pap

## 2016-08-30 NOTE — Assessment & Plan Note (Signed)
Encouraged to stay off cigarettes

## 2016-08-30 NOTE — Assessment & Plan Note (Signed)
She has f/u with psychiatry Will get her in with sherrie as well.

## 2016-08-31 ENCOUNTER — Ambulatory Visit: Payer: Self-pay | Admitting: Licensed Clinical Social Worker

## 2016-08-31 LAB — URINALYSIS, ROUTINE W REFLEX MICROSCOPIC
Bilirubin Urine: NEGATIVE
GLUCOSE, UA: NEGATIVE
Hgb urine dipstick: NEGATIVE
Ketones, ur: NEGATIVE
Leukocytes, UA: NEGATIVE
Nitrite: NEGATIVE
PH: 6 (ref 5.0–8.0)
Protein, ur: NEGATIVE
Specific Gravity, Urine: 1.017 (ref 1.001–1.035)

## 2016-08-31 LAB — T-HELPER CELL (CD4) - (RCID CLINIC ONLY)
CD4 T CELL ABS: 760 /uL (ref 400–2700)
CD4 T CELL HELPER: 40 % (ref 33–55)

## 2016-08-31 LAB — RPR

## 2016-08-31 LAB — URINE CYTOLOGY ANCILLARY ONLY
Chlamydia: NEGATIVE
NEISSERIA GONORRHEA: NEGATIVE

## 2016-09-01 LAB — HIV-1 RNA QUANT-NO REFLEX-BLD
HIV 1 RNA Quant: 21 copies/mL — ABNORMAL HIGH
HIV-1 RNA Quant, Log: 1.32 Log copies/mL — ABNORMAL HIGH

## 2016-09-06 ENCOUNTER — Telehealth: Payer: Self-pay | Admitting: Licensed Clinical Social Worker

## 2016-09-06 ENCOUNTER — Ambulatory Visit: Payer: Self-pay | Admitting: Licensed Clinical Social Worker

## 2016-09-06 NOTE — Telephone Encounter (Signed)
Left message informing patient that her appointment on Friday is for a Pap and not with me.  I informed that I will be in an appointment from 10:45 am to 11:45 am on Friday and that I do not have any other availability in the afternoon.  Asked to consider appt for next week due to her transportation issue and afternoon meeting.  Sande Rives, [facility]

## 2016-09-07 NOTE — Addendum Note (Signed)
Addended by: Harvin Hazel on: 09/07/2016 05:49 PM   Modules accepted: Orders

## 2016-09-08 ENCOUNTER — Ambulatory Visit (INDEPENDENT_AMBULATORY_CARE_PROVIDER_SITE_OTHER): Payer: Medicare HMO | Admitting: *Deleted

## 2016-09-08 ENCOUNTER — Other Ambulatory Visit (HOSPITAL_COMMUNITY)
Admission: RE | Admit: 2016-09-08 | Discharge: 2016-09-08 | Disposition: A | Discharge status: Home / Self Care | Payer: Medicare HMO | Source: Ambulatory Visit | Attending: Infectious Diseases | Admitting: Infectious Diseases

## 2016-09-08 DIAGNOSIS — Z113 Encounter for screening for infections with a predominantly sexual mode of transmission: Secondary | ICD-10-CM | POA: Diagnosis not present

## 2016-09-08 DIAGNOSIS — Z124 Encounter for screening for malignant neoplasm of cervix: Secondary | ICD-10-CM | POA: Diagnosis present

## 2016-09-08 NOTE — Patient Instructions (Signed)
Lease call

## 2016-09-08 NOTE — Progress Notes (Signed)
Subjective:     Latoya Wilson is a 56 y.o. woman who comes in today for a  pap smear only.  Previous abnormal Pap smears: yes.  Contraception: condoms  Objective:    There were no vitals taken for this visit. Pelvic Exam:  Pap smear obtained.   Assessment:    Screening pap smear.   Plan:    Follow up in one year, or as indicated by Pap results. Given condoms.

## 2016-09-11 ENCOUNTER — Ambulatory Visit (INDEPENDENT_AMBULATORY_CARE_PROVIDER_SITE_OTHER): Payer: Medicare HMO | Admitting: Licensed Clinical Social Worker

## 2016-09-11 DIAGNOSIS — F321 Major depressive disorder, single episode, moderate: Secondary | ICD-10-CM

## 2016-09-11 LAB — CERVICOVAGINAL ANCILLARY ONLY
Chlamydia: NEGATIVE
Neisseria Gonorrhea: NEGATIVE

## 2016-09-11 NOTE — Progress Notes (Signed)
Integrated Behavioral Health Follow Up Visit  MRN: [id number] Name: Latoya Wilson   Session Start time: 1:47 pm Session End time: 2:50 pm Total time: 1 hour Number of Integrated Behavioral Health Clinician visits: 2/10  Type of Service: [place] Interpretor:Yes.   Interpretor Name and Language: N/A   Warm Hand Off Completed.       SUBJECTIVE: Latoya Wilson is a 56 y.o. female accompanied by patient.  Patient was referred by Dr. Johnnye Sima for grief/loss reactions, history of depression, and possible substance use. Patient denied that she has been able to make appointment with another psychiatrist.  Patient was receptive to calling [employer] to locate provider within network.  Patient then selected a provider and made appointment for end of the month.  Patient denied that she has called the [facility] and was receptive to calling to enter their grief group.  Patient was receptive to taking a self-administered substance use assessment.  OBJECTIVE: Mood: Depressed and Affect: Labile Risk of harm to self or others: No plan to harm self or others  Thought process: Tangential Thought Content: Logical   LIFE CONTEXT: Family and Social: anniversary of death of son coming up on 2022-10-11    GOALS ADDRESSED: Patient will reduce symptoms of: depression and grief, and increase knowledge and/or ability of: coping skills and also: Increase healthy adjustment to current life circumstances  INTERVENTIONS: Supportive Counseling and Link to [employer] Standardized Assessments completed: DAST-20, with score of 4, which is low and only requires brief counseling.  ASSESSMENT: Patient is currently experiencing depressive symptoms. Patient may benefit from community health referral for mental health counseling and referral for grief counseling.  PLAN: 1. Referral(s): Armed forces logistics/support/administrative officer (LME/Outside Clinic) and Psychiatrist   2. Appointment with the [facility] for June 27th at 10 am. 3. Patient will call the [facility] to inquire about grief counseling.   Sande Rives, [facility]

## 2016-09-12 LAB — CYTOLOGY - PAP
Adequacy: ABSENT
Diagnosis: NEGATIVE

## 2016-09-12 NOTE — Progress Notes (Signed)
  Name: Latoya Wilson  MRN: [id number]  Date: 08/30/16  Time started: 3:40 pm    Time ended: 4:00 pm  Behavior: Psychomotor agitation; Cooperative Mood: Anxious Affect: Sad Thought Process: Flight of ideas Thought Content: Logical Thought Disorder and Perceptual Anomalies: None observed or self-reported S/I and H/I: Denied past attempts and denied current ideations, plan, or intent  Referral Source: Dr. Johnnye Sima  Content of Session: Met with patient based on referral from provider, due to grief and loss reactions.  Intervention Provided: Gathered history of mental health treatment.  Performed suicide assessment.  Provided referral for grief counseling.  Arranged for appointment.  Response: Patient denied currently receiving counseling services but admitted to seeing a current psychiatrist in [place].  Patient reported being prescribed Effexor ER 225 mg (TID) and Wellbutrin but denied medication compliance for the Wellbutrin.  Patient also denied informing provider that she is not med compliant and reported that she did not like working with the provider because she could not understand him.  Patient reported that the Effexor ER "helps my brain think right" and reported that it is working.  Patient reported having diagnosis of Depression and Anxiety, and Bipolar DO and denied having unmanaged depressive symptoms currently.  Patient reported having history of major anxiety with experience of panic attacks.  Patient verbalized current difficulty with the approaching anniversary of her son's death, which occurred on 09-23-2015.  Patient was receptive to need to begin counseling now to develop plan to manage feelings before that date.  Patient denied past suicide attempts and denied current suicidal ideations, plan, or intent.  Patient was receptive to joining grief group to receive support from others and was receptive to individual grief counseling.  Patient was provided contact information for  [facility] to inquire about receiving counseling services and stated that she would call.  Patient also was receptive to assistance from the [facility] in locating a counselor for mental health services and was receptive to possibly locating another psychiatrist to continue medication management services.  Patient was excited about the possibility of locating the providers within the same agency.  Patient was receptive to scheduling an appointment with me to assess her current needs and provide resources and referrals, and to assist in locating providers.  Plan: Patient will contact [facility] to schedule grief counseling.  [facility] will assist patient in locating psychiatrist and counselor and set-up appointments, will assess current substance use and provide referrals as necessary, and provide resources as necessary.  Next appointment is scheduled for Wednesday June 6th at 1:30 pm.  Sande Rives, [facility]

## 2016-11-24 ENCOUNTER — Ambulatory Visit (HOSPITAL_COMMUNITY): Payer: Medicare HMO | Admitting: Psychiatry

## 2017-02-21 ENCOUNTER — Other Ambulatory Visit: Payer: Self-pay

## 2017-03-07 ENCOUNTER — Ambulatory Visit: Payer: Self-pay | Admitting: Infectious Diseases

## 2017-03-07 ENCOUNTER — Ambulatory Visit (INDEPENDENT_AMBULATORY_CARE_PROVIDER_SITE_OTHER): Payer: Medicare HMO | Admitting: Infectious Diseases

## 2017-03-07 ENCOUNTER — Encounter: Payer: Self-pay | Admitting: Infectious Diseases

## 2017-03-07 VITALS — BP 121/82 | HR 97 | Temp 98.1°F | Ht 65.0 in | Wt 187.0 lb

## 2017-03-07 DIAGNOSIS — M5410 Radiculopathy, site unspecified: Secondary | ICD-10-CM

## 2017-03-07 DIAGNOSIS — Z23 Encounter for immunization: Secondary | ICD-10-CM

## 2017-03-07 DIAGNOSIS — Z79899 Other long term (current) drug therapy: Secondary | ICD-10-CM

## 2017-03-07 DIAGNOSIS — Z113 Encounter for screening for infections with a predominantly sexual mode of transmission: Secondary | ICD-10-CM

## 2017-03-07 DIAGNOSIS — B20 Human immunodeficiency virus [HIV] disease: Secondary | ICD-10-CM

## 2017-03-07 DIAGNOSIS — F17200 Nicotine dependence, unspecified, uncomplicated: Secondary | ICD-10-CM | POA: Diagnosis not present

## 2017-03-07 DIAGNOSIS — F3132 Bipolar disorder, current episode depressed, moderate: Secondary | ICD-10-CM

## 2017-03-07 NOTE — Assessment & Plan Note (Signed)
She becomes tearful during eval.  She has f/u appt tomorrow She is offered eval today but defers.

## 2017-03-07 NOTE — Progress Notes (Signed)
   Subjective:    Patient ID: Latoya Wilson, female    DOB: 1960/11/27, 56 y.o.   MRN: [id number]  HPI 56 yo F with hx of HIV+, HTN, bipolar d/o. Taking ATVr/TRV -->evotaz-/descovy (2016).  Smoking- 2 cig/day.  Has been having back pain- has had injections. Had MRI yesterday- not sure what her next steps are. She is having issues with ADLs.  Mammo-  05-2014 PAP (-) 09-08-16.  Working on her relationship with her daughter. Has appt tomorrow at [facility]. She has enjoyed f/u there.  Has insurance fixed now where she has no copays now.   HIV 1 RNA Quant (copies/mL)  Date Value  08/30/2016 21 (H)  05/02/2016 <20 DETECTED (A)  05/20/2015 <20   CD4 T Cell Abs (/uL)  Date Value  08/30/2016 760  05/02/2016 1,000  05/20/2015 490     Review of Systems  Constitutional: Positive for unexpected weight change. Negative for appetite change.  Respiratory: Negative for shortness of breath.   Gastrointestinal: Negative for diarrhea and nausea.  Genitourinary: Negative for difficulty urinating.  Neurological: Negative for seizures.  Psychiatric/Behavioral: Positive for dysphoric mood.  no sz in last 5 years.  Please see HPI. All other systems reviewed and negative.      Objective:   Physical Exam  Constitutional: She appears well-developed and well-nourished.  HENT:  Mouth/Throat: No oropharyngeal exudate.  Eyes: EOM are normal. Pupils are equal, round, and reactive to light.  Neck: Neck supple.  Cardiovascular: Normal rate, regular rhythm and normal heart sounds.  Pulmonary/Chest: Effort normal and breath sounds normal.  Abdominal: Soft. Bowel sounds are normal. There is no tenderness. There is no rebound.  Lymphadenopathy:    She has no cervical adenopathy.  Psychiatric: Her speech is rapid and/or pressured. She exhibits a depressed mood.       Assessment & Plan:

## 2017-03-07 NOTE — Assessment & Plan Note (Signed)
Has cut down significantly Will continue to push to quit.

## 2017-03-07 NOTE — Assessment & Plan Note (Signed)
appreciate her ongoing eval.  She will f/u with her MRI.

## 2017-03-07 NOTE — Assessment & Plan Note (Addendum)
Will get her set up fo rmamogram Will get her help from [facility] for [facility].  Check her labs today Offered/refused condoms.  Her boyfriend is (-). She states they use condoms, are "careful". He gets checked by PCP. I offered PREP but she deffered.  Flu vax today.  rtc in 6 months.

## 2017-03-08 LAB — COMPREHENSIVE METABOLIC PANEL
AG RATIO: 1.9 (calc) (ref 1.0–2.5)
ALKALINE PHOSPHATASE (APISO): 124 U/L (ref 33–130)
ALT: 34 U/L — AB (ref 6–29)
AST: 32 U/L (ref 10–35)
Albumin: 4.6 g/dL (ref 3.6–5.1)
BILIRUBIN TOTAL: 2.7 mg/dL — AB (ref 0.2–1.2)
BUN / CREAT RATIO: 14 (calc) (ref 6–22)
BUN: 17 mg/dL (ref 7–25)
CALCIUM: 9.6 mg/dL (ref 8.6–10.4)
CHLORIDE: 101 mmol/L (ref 98–110)
CO2: 27 mmol/L (ref 20–32)
Creat: 1.24 mg/dL — ABNORMAL HIGH (ref 0.50–1.05)
GLOBULIN: 2.4 g/dL (ref 1.9–3.7)
Glucose, Bld: 110 mg/dL — ABNORMAL HIGH (ref 65–99)
Potassium: 4.1 mmol/L (ref 3.5–5.3)
Sodium: 138 mmol/L (ref 135–146)
Total Protein: 7 g/dL (ref 6.1–8.1)

## 2017-03-08 LAB — LIPID PANEL
Cholesterol: 224 mg/dL — ABNORMAL HIGH (ref <200)
HDL: 68 mg/dL (ref >50)
LDL Cholesterol (Calc): 123 mg/dL (calc) — ABNORMAL HIGH
NON-HDL CHOLESTEROL (CALC): 156 mg/dL — AB (ref <130)
TRIGLYCERIDES: 211 mg/dL — AB (ref <150)
Total CHOL/HDL Ratio: 3.3 (calc) (ref <5.0)

## 2017-03-08 LAB — CBC
HEMATOCRIT: 43.4 % (ref 35.0–45.0)
HEMOGLOBIN: 15.3 g/dL (ref 11.7–15.5)
MCH: 31.7 pg (ref 27.0–33.0)
MCHC: 35.3 g/dL (ref 32.0–36.0)
MCV: 89.9 fL (ref 80.0–100.0)
MPV: 11.2 fL (ref 7.5–12.5)
Platelets: 240 10*3/uL (ref 140–400)
RBC: 4.83 10*6/uL (ref 3.80–5.10)
RDW: 12.1 % (ref 11.0–15.0)
WBC: 9.5 10*3/uL (ref 3.8–10.8)

## 2017-03-08 LAB — RPR: RPR: NONREACTIVE

## 2017-03-08 LAB — T-HELPER CELL (CD4) - (RCID CLINIC ONLY)
CD4 T CELL ABS: 780 /uL (ref 400–2700)
CD4 T CELL HELPER: 39 % (ref 33–55)

## 2017-03-09 LAB — HIV-1 RNA QUANT-NO REFLEX-BLD
HIV 1 RNA Quant: <20 copies/mL
HIV-1 RNA Quant, Log: <1.3 Log copies/mL

## 2017-04-11 ENCOUNTER — Other Ambulatory Visit: Payer: Self-pay | Admitting: *Deleted

## 2017-04-11 DIAGNOSIS — B20 Human immunodeficiency virus [HIV] disease: Secondary | ICD-10-CM

## 2017-04-11 MED ORDER — EMTRICITABINE-TENOFOVIR AF 200-25 MG PO TABS: 1.0000 | ORAL_TABLET | Freq: Every day | ORAL | 2 refills | Status: DC

## 2017-07-27 ENCOUNTER — Other Ambulatory Visit: Payer: Self-pay | Admitting: Infectious Diseases

## 2017-07-27 ENCOUNTER — Other Ambulatory Visit: Payer: Self-pay | Admitting: Behavioral Health

## 2017-07-27 DIAGNOSIS — B20 Human immunodeficiency virus [HIV] disease: Secondary | ICD-10-CM

## 2017-09-05 ENCOUNTER — Ambulatory Visit: Payer: Self-pay | Admitting: Infectious Diseases

## 2017-11-12 ENCOUNTER — Other Ambulatory Visit: Payer: Self-pay | Admitting: Behavioral Health

## 2017-11-12 ENCOUNTER — Other Ambulatory Visit: Payer: Self-pay | Admitting: Infectious Diseases

## 2017-11-12 DIAGNOSIS — B20 Human immunodeficiency virus [HIV] disease: Secondary | ICD-10-CM

## 2017-11-12 MED ORDER — EMTRICITABINE-TENOFOVIR AF 200-25 MG PO TABS: 1.0000 | ORAL_TABLET | Freq: Every day | ORAL | 0 refills | Status: DC

## 2017-11-12 MED ORDER — ATAZANAVIR-COBICISTAT 300-150 MG PO TABS: ORAL_TABLET | ORAL | 0 refills | Status: DC

## 2017-12-17 ENCOUNTER — Other Ambulatory Visit: Payer: Self-pay | Admitting: Infectious Diseases

## 2017-12-17 DIAGNOSIS — B20 Human immunodeficiency virus [HIV] disease: Secondary | ICD-10-CM

## 2017-12-18 ENCOUNTER — Telehealth: Payer: Self-pay | Admitting: *Deleted

## 2017-12-18 NOTE — Telephone Encounter (Signed)
RN received refill request. Patient is overdue for visit (no showed her 6 month follow up in June 2019). RN left generic message "This is Sharyn Lull from your doctor's office. We received a refill request. Please call us to get on the schedule." 30 day refills sent with message to schedule appointment asap. Landis Gandy, RN

## 2017-12-18 NOTE — Telephone Encounter (Signed)
thanks

## 2018-01-02 ENCOUNTER — Other Ambulatory Visit: Payer: Self-pay | Admitting: Infectious Diseases

## 2018-01-02 DIAGNOSIS — Z1231 Encounter for screening mammogram for malignant neoplasm of breast: Secondary | ICD-10-CM

## 2018-01-15 ENCOUNTER — Other Ambulatory Visit: Payer: Self-pay | Admitting: Infectious Diseases

## 2018-01-15 DIAGNOSIS — B20 Human immunodeficiency virus [HIV] disease: Secondary | ICD-10-CM

## 2018-01-23 ENCOUNTER — Other Ambulatory Visit: Payer: Self-pay | Admitting: Infectious Diseases

## 2018-01-23 DIAGNOSIS — B20 Human immunodeficiency virus [HIV] disease: Secondary | ICD-10-CM

## 2018-02-18 ENCOUNTER — Ambulatory Visit: Payer: Self-pay

## 2018-03-01 ENCOUNTER — Other Ambulatory Visit: Payer: Self-pay | Admitting: Infectious Diseases

## 2018-03-01 DIAGNOSIS — B20 Human immunodeficiency virus [HIV] disease: Secondary | ICD-10-CM

## 2018-04-01 ENCOUNTER — Other Ambulatory Visit: Payer: Self-pay | Admitting: Infectious Diseases

## 2018-04-01 DIAGNOSIS — B20 Human immunodeficiency virus [HIV] disease: Secondary | ICD-10-CM

## 2018-05-01 ENCOUNTER — Ambulatory Visit (INDEPENDENT_AMBULATORY_CARE_PROVIDER_SITE_OTHER): Payer: Medicare HMO | Admitting: Licensed Clinical Social Worker

## 2018-05-01 ENCOUNTER — Ambulatory Visit (INDEPENDENT_AMBULATORY_CARE_PROVIDER_SITE_OTHER): Payer: Medicare HMO | Admitting: Pharmacist

## 2018-05-01 DIAGNOSIS — B20 Human immunodeficiency virus [HIV] disease: Secondary | ICD-10-CM | POA: Diagnosis not present

## 2018-05-01 DIAGNOSIS — F331 Major depressive disorder, recurrent, moderate: Secondary | ICD-10-CM | POA: Diagnosis not present

## 2018-05-01 MED ORDER — DOLUTEGRAVIR SODIUM 50 MG PO TABS: 50.0000 mg | ORAL_TABLET | Freq: Every day | ORAL | 5 refills | Status: DC

## 2018-05-01 MED ORDER — EMTRICITABINE-TENOFOVIR AF 200-25 MG PO TABS: 1.0000 | ORAL_TABLET | Freq: Every day | ORAL | 5 refills | Status: DC

## 2018-05-01 NOTE — BH Specialist Note (Signed)
Integrated Behavioral Health Initial Visit  MRN: [id number] Name: Latoya Wilson  Number of [place] Clinician visits:: 1/6 Session Start time: 2:15pm  Session End time: 2:49pm Total time: 35 minutes  Type of Service: [place] Interpretor:No. Interpretor Name and Language: n/a   Warm Hand Off Completed.       SUBJECTIVE: Latoya Wilson is a 58 y.o. female accompanied by self Patient was referred by Bruna Potter for grief and depression. Patient reports the following symptoms/concerns: apathy, no enjoyment, lack of energy, no motivation, sleeping too much, hopeless feelings, crying spells, anxiety/anxiety attacks, thoughts of death/dying (not suicide), difficulty remembering, difficulty concentrating, depressed mood Duration of problem: 18 months; Severity of problem: moderate  OBJECTIVE: Mood: Depressed and Affect: Tearful Risk of harm to self or others: No plan to harm self or others  LIFE CONTEXT: Patient has a home where her daughter and daughter's boyfriend live with her. She reports that daughter has had a difficult time and is doing well now, but does not work and her boyfriend does not help out with bills at all. Patient receives about $1000 in disability income each month and struggles to pay all bills with it. She identifies as her main support her childhood best friend, Horris Latino, who lives in [place] but is visiting soon to help her, a best friend in [place] who was close to her son, and her boyfriend who is 72 years old and lives at the end of her driveway.  GOALS ADDRESSED: Patient will: 1. Reduce symptoms of: depression 2. Demonstrate ability to: Begin healthy grieving over loss  INTERVENTIONS: Interventions utilized: Motivational Interviewing and Supportive Counseling   ASSESSMENT: Patient currently experiencing  depressed mood, apathy toward life, thoughts of dying though no thoughts of suicide, a feeling  of hopelessness, crying spells, little motivation, low self-care, tendency to isolate, anhedonia, hypersomnia, difficulty with concentration and memory (may be a result of illness rather than depression). She also reports anxiety attacks when she thinks of all the things she "should" be doing but has neither the energy nor motivation to actually do. The most appropriate diagnosis at this time is Major Depressive Disorder, Recurrent, Moderate, as son has been deceased for 1& 1/2  years and so bereavement is no longer the most appropriate. In addition, patient endorses past episodes of depressive symptoms. Son's death has been traumatic for her, but it is not yet clear whether the complex trauma of the loss has resulted in disorder for patient.   Counselor processed with patient her experience surrounding son's death. Patient identified that she knows a lot of facts surrounding the death, but does not have the information she needs to know what actually happened (son's body was found approximately 10 days after he died) and therefore has more questions than answers. Counselor guided patient to identify how she has been dealing for the last year and a half. Counselor normalized patient's grief responses, and emphasized that no one is supposed to know how to handle losing their child. Patient and counselor explored ways that patient's life has changed since son's death. Counselor guided patient to identify goals for counseling.    Patient may benefit from weekly CBT and grief therapy.  PLAN: 1. Follow up with behavioral health clinician on : 05/15/18.   Lillie Fragmin, LCSW

## 2018-05-01 NOTE — Progress Notes (Signed)
HPI: Latoya Wilson is a 58 y.o. female presenting to [place] clinic for medication management and HIV follow-up.  Patient Active Problem List   Diagnosis Date Noted  . Panic disorder 04/15/2015  . Bipolar affective disorder, currently depressed, moderate ([place]) 04/15/2015  . Tobacco use disorder 02/18/2015  . Metrorrhagia 05/05/2014  . Pure hypercholesterolemia 05/05/2014  . Fibrositis 07/10/2012  . Apnea, sleep 04/04/2012  . CONTACT DERMATITIS 05/13/2010  . LACERATION OF FINGER 03/22/2010  . Back pain with radiculopathy 01/21/2010  . Essential hypertension 01/10/2010  . SINUSITIS, CHRONIC 01/07/2010  . PLANTAR WART, RIGHT 10/25/2009  . MEMORY LOSS 06/09/2009  . RESTLESS LEGS SYNDROME 03/03/2009  . REFLEX SYMPATHETIC DYSTROPHY 03/03/2009  . BRONCHITIS 02/24/2009  . NEVUS 03/18/2008  . ABSCESS, TOOTH 12/27/2007  . LESION, VAGINA 06/28/2007  . TENDINITIS 04/19/2007  . Human immunodeficiency virus (HIV) disease ([place]) 04/11/2007  . ANEMIA, VITAMIN B12 DEFICIENCY 12/13/2006  . MUSCLE PAIN 09/10/2006  . 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 ASTHMA 07/24/2006    Patient's Medications  New Prescriptions   DOLUTEGRAVIR (TIVICAY) 50 MG TABLET    Take 1 tablet (50 mg total) by mouth daily.  Previous Medications   ALBUTEROL (PROVENTIL) (2.5 MG/3ML) 0.083% NEBULIZER SOLUTION    Take 2.5 mg by nebulization every 6 (six) hours as needed for wheezing or shortness of breath.   AMLODIPINE-ATORVASTATIN (CADUET) 10-40 MG TABLET    Take 1 tablet by mouth daily.   BECLOMETHASONE (QVAR) 80 MCG/ACT INHALER    Inhale 2 puffs into the lungs as needed.   BUDESONIDE (PULMICORT) 0.5 MG/2ML NEBULIZER SOLUTION    Take 0.5 mg by nebulization 2 (two) times daily.   CYANOCOBALAMIN (,VITAMIN B-12,) 1000 MCG/ML INJECTION    Inject 1,000 mcg into the muscle every 30 (thirty) days. Around the 5th of every month   MODAFINIL (PROVIGIL) 200 MG TABLET    Take 100 mg by mouth.    OMEPRAZOLE (PRILOSEC) 40 MG  CAPSULE    Take 40 mg by mouth daily.   PREGABALIN (LYRICA) 200 MG CAPSULE    Take 150 mg by mouth 2 (two) times daily.    VARENICLINE (CHANTIX) 0.5 MG TABLET    Take 1 mg by mouth 2 (two) times daily. Unsure of dose    VENLAFAXINE XR (EFFEXOR-XR) 75 MG 24 HR CAPSULE    Take 3 capsules (225 mg total) by mouth daily with breakfast.  Modified Medications   Modified Medication Previous Medication   EMTRICITABINE-TENOFOVIR AF (DESCOVY) 200-25 MG TABLET DESCOVY 200-25 MG tablet      Take 1 tablet by mouth daily.    TAKE 1 TABLET BY MOUTH DAILY.  Discontinued Medications   AMLODIPINE (NORVASC) 10 MG TABLET    Take 10 mg by mouth daily.   ASPIRIN 81 MG TABLET    Take 81 mg by mouth daily. Reported on 09/28/2015   BUPROPION (WELLBUTRIN XL) 300 MG 24 HR TABLET       BUPROPION (WELLBUTRIN) 100 MG TABLET    Take 2 tablets (200 mg total) by mouth daily.   EVOTAZ 300-150 MG TABLET    TAKE 1 TABLET BY MOUTH DAILY WITH FOOD; SWALLOW WHOLE, DO NOT CRUSH, CUT, OR CHEW   FLUOXETINE (PROZAC) 20 MG CAPSULE    Take by mouth.   LAMOTRIGINE (LAMICTAL) 100 MG TABLET    Take 1 tablet (100 mg total) by mouth daily. Take one a day   TRETINOIN (RETIN-A) 0.025 % CREAM    Apply topically at bedtime.  Allergies: Allergies  Allergen Reactions  . Oxycodone Other (See Comments)    Photosensitivity and nausea and vomiting seizures    Past Medical History: Past Medical History:  Diagnosis Date  . Bipolar affective disorder ([place])   . HIV infection ([place])   . Hyperlipidemia   . Hypertension   . Narcolepsy   . Neuropathy   . Restless leg syndrome   . Seizures ([place])   . Sleep apnea     Social History: Social History   Socioeconomic History  . Marital status: Divorced    Spouse name: Not on file  . Number of children: Not on file  . Years of education: Not on file  . Highest education level: Not on file  Occupational History  . Not on file  Social Needs  . Financial resource strain: Not on file  . Food  insecurity:    Worry: Not on file    Inability: Not on file  . Transportation needs:    Medical: Not on file    Non-medical: Not on file  Tobacco Use  . Smoking status: Heavy Tobacco Smoker    Packs/day: 0.50    Years: 35.00    Pack years: 17.50    Types: Cigarettes  . Smokeless tobacco: Never Used  . Tobacco comment: chantix  Substance and Sexual Activity  . Alcohol use: No    Alcohol/week: 0.0 standard drinks  . Drug use: No  . Sexual activity: Yes    Partners: Male    Birth control/protection: Condom    Comment: pt. given condoms  Lifestyle  . Physical activity:    Days per week: Not on file    Minutes per session: Not on file  . Stress: Not on file  Relationships  . Social connections:    Talks on phone: Not on file    Gets together: Not on file    Attends religious service: Not on file    Active member of club or organization: Not on file    Attends meetings of clubs or organizations: Not on file    Relationship status: Not on file  Other Topics Concern  . Not on file  Social History Narrative  . Not on file    Labs: Lab Results  Component Value Date   HIV1RNAQUANT <20 NOT DETECTED 03/07/2017   HIV1RNAQUANT 21 (H) 08/30/2016   HIV1RNAQUANT <20 DETECTED (A) 05/02/2016   CD4TABS 780 03/07/2017   CD4TABS 760 08/30/2016   CD4TABS 1,000 05/02/2016    RPR and STI Lab Results  Component Value Date   LABRPR NON-REACTIVE 03/07/2017   LABRPR NON REAC 08/30/2016   LABRPR NON REAC 05/02/2016   LABRPR NON REAC 05/20/2015   LABRPR NON REAC 01/27/2015    STI Results GC CT  09/08/2016 Negative Negative  08/30/2016 Negative Negative  05/02/2016 Negative Negative  06/11/2015 Negative Negative  01/27/2015 Negative Negative    Hepatitis B Lab Results  Component Value Date   HEPBSAB NEG 09/16/2013   HEPBSAG NEG 04/11/2007   HEPBCAB NEG 04/11/2007   Hepatitis C No results found for: HEPCAB, HCVRNAPCRQN Hepatitis A Lab Results  Component Value Date   HAV NEG  04/11/2007   Lipids: Lab Results  Component Value Date   CHOL 224 (H) 03/07/2017   TRIG 211 (H) 03/07/2017   HDL 68 03/07/2017   CHOLHDL 3.3 03/07/2017   VLDL 28 05/02/2016   LDLCALC 123 (H) 03/07/2017    Current HIV Regimen: Evotaz + Descovy daily  Assessment: Latoya Wilson is here  today to go over her medications and follow-up with her for her HIV after not being seen in clinic since December 2018. She has been calling for refills for her HIV medications and has been receiving one month at a time each time she calls. [facility] pharmacy called yesterday regarding a medication interaction they found with Evotaz and a new prescription for omeprazole. I called Latoya Wilson yesterday to inquire about her new prescription and she was agreeable to come into clinic today. She brought all of her current medications with her.   She was tearful in discussing all that she has been going through since the last time she was here. She has been having a very difficult time handling the death of her son a year and a half ago and has not seen anyone for her grief. We were able to get her to see Latoya Wilson today and she will continue to follow-up with her.  She received the new prescription for omeprazole 78m because she has been experiencing bad acid reflux at night. Noted a drug interaction between omeprazole and Evotaz. This interaction warranted a closer look into her medications and I found another drug interaction between her modafinil and Evotaz, although she has been taking both of these medications for years. She has been undetectable for years and has no issues with taking her medications but I explored other options to avoid these drug interactions.  Ruled out [place] it is not recommended with coadministration with modafinil. Ruled out Triumeq because she does not have HLA-B*[id number] testing. Ruled out Symtuza due to contraindication with modafinil. Tivicay is a safe and reasonable option that does not appear to  have any drug interactions with any of her current medications. PKeyairais agreeable to make the change from [place] to [place]  Will order labs today including HIV RNA, CD4, CMET, CBC, lipid panel, RPR. Will schedule her back in the clinic to see Dr. HJohnnye Simain a month. Gave her Latoya Wilson card and encouraged her to call with any questions or concerns that she has regarding her medications until then. Also, told her to let u[place]now if she does have a copay or trouble affording her medications as she has Medicare and Medicaid.   Plan: [place] once daily -HIV RNA, CD4, CMET, CBC, lipid panel, RPR -Follow up with RRollene Fareon 12/12 at 11:30am -Follow-up with Dr. HJohnnye Simaon 2/28 [facility]  05/01/2018, 3:56 PM

## 2018-05-02 LAB — T-HELPER CELL (CD4) - (RCID CLINIC ONLY)
CD4 % Helper T Cell: 40 % (ref 33–55)
CD4 T Cell Abs: 720 /uL (ref 400–2700)

## 2018-05-03 LAB — HIV-1 RNA QUANT-NO REFLEX-BLD
HIV 1 RNA Quant: 176 copies/mL — ABNORMAL HIGH
HIV-1 RNA Quant, Log: 2.25 Log copies/mL — ABNORMAL HIGH

## 2018-05-03 LAB — COMPREHENSIVE METABOLIC PANEL
AG Ratio: 1.9 (calc) (ref 1.0–2.5)
ALT: 22 U/L (ref 6–29)
AST: 23 U/L (ref 10–35)
Albumin: 4.5 g/dL (ref 3.6–5.1)
Alkaline phosphatase (APISO): 125 U/L (ref 33–130)
BUN: 17 mg/dL (ref 7–25)
CO2: 27 mmol/L (ref 20–32)
CREATININE: 0.97 mg/dL (ref 0.50–1.05)
Calcium: 10 mg/dL (ref 8.6–10.4)
Chloride: 101 mmol/L (ref 98–110)
Globulin: 2.4 g/dL (calc) (ref 1.9–3.7)
Glucose, Bld: 105 mg/dL — ABNORMAL HIGH (ref 65–99)
Potassium: 4.4 mmol/L (ref 3.5–5.3)
Sodium: 138 mmol/L (ref 135–146)
TOTAL PROTEIN: 6.9 g/dL (ref 6.1–8.1)
Total Bilirubin: 1.9 mg/dL — ABNORMAL HIGH (ref 0.2–1.2)

## 2018-05-03 LAB — CBC
HCT: 41.8 % (ref 35.0–45.0)
Hemoglobin: 14.5 g/dL (ref 11.7–15.5)
MCH: 30.9 pg (ref 27.0–33.0)
MCHC: 34.7 g/dL (ref 32.0–36.0)
MCV: 89.1 fL (ref 80.0–100.0)
MPV: 11.2 fL (ref 7.5–12.5)
Platelets: 232 10*3/uL (ref 140–400)
RBC: 4.69 10*6/uL (ref 3.80–5.10)
RDW: 12 % (ref 11.0–15.0)
WBC: 5.9 10*3/uL (ref 3.8–10.8)

## 2018-05-03 LAB — LIPID PANEL
CHOL/HDL RATIO: 3.5 (calc) (ref <5.0)
Cholesterol: 199 mg/dL (ref <200)
HDL: 57 mg/dL (ref >50)
LDL Cholesterol (Calc): 120 mg/dL (calc) — ABNORMAL HIGH
Non-HDL Cholesterol (Calc): 142 mg/dL (calc) — ABNORMAL HIGH (ref <130)
Triglycerides: 113 mg/dL (ref <150)

## 2018-05-03 LAB — RPR: RPR Ser Ql: NONREACTIVE

## 2018-05-15 ENCOUNTER — Ambulatory Visit: Payer: Medicare HMO | Admitting: Licensed Clinical Social Worker

## 2018-05-31 ENCOUNTER — Ambulatory Visit: Payer: Medicare HMO | Admitting: Infectious Diseases

## 2018-07-02 ENCOUNTER — Other Ambulatory Visit: Payer: Self-pay | Admitting: Infectious Diseases

## 2018-07-02 DIAGNOSIS — B20 Human immunodeficiency virus [HIV] disease: Secondary | ICD-10-CM

## 2018-10-23 ENCOUNTER — Telehealth: Payer: Self-pay

## 2018-10-23 NOTE — Telephone Encounter (Signed)
Received refill request for patient's Tivicay. Patient was last seen by pharmacy team on 1/29,however, patient missed last appointment with Dr. Johnnye Sima on 7/28.  Patient was able to agree to an appointment on 7/28, would like labs same day. Patient denies any issues with medication during call. [place]

## 2018-10-29 ENCOUNTER — Other Ambulatory Visit: Payer: Self-pay | Admitting: Infectious Diseases

## 2018-10-29 ENCOUNTER — Encounter: Payer: Self-pay | Admitting: Infectious Diseases

## 2018-10-29 ENCOUNTER — Other Ambulatory Visit: Payer: Self-pay

## 2018-10-29 ENCOUNTER — Ambulatory Visit (INDEPENDENT_AMBULATORY_CARE_PROVIDER_SITE_OTHER): Payer: Medicare HMO | Admitting: Infectious Diseases

## 2018-10-29 VITALS — BP 159/105 | HR 112 | Temp 98.4°F

## 2018-10-29 DIAGNOSIS — F331 Major depressive disorder, recurrent, moderate: Secondary | ICD-10-CM

## 2018-10-29 DIAGNOSIS — F3132 Bipolar disorder, current episode depressed, moderate: Secondary | ICD-10-CM

## 2018-10-29 DIAGNOSIS — B20 Human immunodeficiency virus [HIV] disease: Secondary | ICD-10-CM

## 2018-10-29 DIAGNOSIS — Z113 Encounter for screening for infections with a predominantly sexual mode of transmission: Secondary | ICD-10-CM

## 2018-10-29 DIAGNOSIS — M5410 Radiculopathy, site unspecified: Secondary | ICD-10-CM | POA: Diagnosis not present

## 2018-10-29 DIAGNOSIS — F17200 Nicotine dependence, unspecified, uncomplicated: Secondary | ICD-10-CM

## 2018-10-29 DIAGNOSIS — Z79899 Other long term (current) drug therapy: Secondary | ICD-10-CM

## 2018-10-29 DIAGNOSIS — I10 Essential (primary) hypertension: Secondary | ICD-10-CM | POA: Diagnosis not present

## 2018-10-29 DIAGNOSIS — E669 Obesity, unspecified: Secondary | ICD-10-CM

## 2018-10-29 MED ORDER — TIVICAY 50 MG PO TABS: 50.0000 mg | ORAL_TABLET | Freq: Every day | ORAL | 5 refills | Status: DC

## 2018-10-29 NOTE — Assessment & Plan Note (Signed)
She will f/u with her previous Psychologist, sport and exercise.

## 2018-10-29 NOTE — Progress Notes (Signed)
   Subjective:    Patient ID: Latoya Wilson, female    DOB: [birth year], 59 y.o.   MRN: [id number]  HPI 58yo F with hx of HIV+ (01-2007) (CD4 200 and HIV RNA 3640), HTN, bipolar d/o. Taking ATVr/TRV -->evotaz-/descovy (2016)(CD4 570, HIV RNA <20).  Smoking- 2-3 cig/day.  Has been having back pain- has had injections which she states did not work. Had MRI- she is not clear of result. She is not sure what her next steps are. Walking with a cane.    She is quite animated today.  Her BP is up today.  Grew a garden this year. Bored. Daughter living with her now. Has been cleaning up her clutter, swimming in her pool.  Blames her wt on steroids she has gotten for her resp issues in the past (asthma, bronchitis). Does not use inhalers regularly.   Mammo-  05-2014. Has not gotten repeat. PAP (-) 09-08-16.   HIV 1 RNA Quant (copies/mL)  Date Value  05/01/2018 176 (H)  03/07/2017 <20 NOT DETECTED  08/30/2016 21 (H)   CD4 T Cell Abs (/uL)  Date Value  05/01/2018 720  03/07/2017 780  08/30/2016 760   No sick exposures that she knows of.   Review of Systems  Constitutional: Negative for appetite change, chills, fever and unexpected weight change.  Respiratory: Positive for cough. Negative for shortness of breath.   Gastrointestinal: Negative for diarrhea and nausea.  Genitourinary: Negative for difficulty urinating.  Musculoskeletal: Positive for back pain and gait problem.  Neurological: Negative for numbness.       Objective:   Physical Exam Constitutional:      Appearance: Normal appearance.  HENT:     Mouth/Throat:     Mouth: Mucous membranes are moist.     Pharynx: No oropharyngeal exudate.  Eyes:     Extraocular Movements: Extraocular movements intact.     Pupils: Pupils are equal, round, and reactive to light.  Neck:     Musculoskeletal: Normal range of motion. No neck rigidity.  Cardiovascular:     Rate and Rhythm: Normal rate and regular rhythm.  Pulmonary:   Effort: Pulmonary effort is normal.     Breath sounds: Normal breath sounds.  Abdominal:     General: Bowel sounds are normal. There is no distension.     Palpations: Abdomen is soft.     Tenderness: There is no abdominal tenderness.  Musculoskeletal: Normal range of motion.  Lymphadenopathy:     Cervical: No cervical adenopathy.  Neurological:     General: No focal deficit present.     Mental Status: She is alert.       Assessment & Plan:

## 2018-10-29 NOTE — Assessment & Plan Note (Signed)
She has been f/u with her PCP. Disconnected from [facility].  Feels well on effexor.

## 2018-10-29 NOTE — Assessment & Plan Note (Addendum)
Encouraged to quit.  Taking chantix.

## 2018-10-29 NOTE — Assessment & Plan Note (Signed)
Will check her labs today Offered/refused condoms.  Not active with her boyfriend.  Will set up for PAP and mammo.  vax appear up to date.  rtc in 9 months.

## 2018-10-29 NOTE — Assessment & Plan Note (Signed)
Will repeat her BP today. No missed doses.   120/75.  Asx.

## 2018-10-29 NOTE — Assessment & Plan Note (Signed)
Prev BMIs 29-31.  Encourage to lose wt. She is aware.

## 2018-11-02 LAB — CBC
HCT: 41.5 % (ref 35.0–45.0)
Hemoglobin: 14.3 g/dL (ref 11.7–15.5)
MCH: 31.2 pg (ref 27.0–33.0)
MCHC: 34.5 g/dL (ref 32.0–36.0)
MCV: 90.4 fL (ref 80.0–100.0)
MPV: 11.9 fL (ref 7.5–12.5)
Platelets: 193 10*3/uL (ref 140–400)
RBC: 4.59 10*6/uL (ref 3.80–5.10)
RDW: 12.1 % (ref 11.0–15.0)
WBC: 4.9 10*3/uL (ref 3.8–10.8)

## 2018-11-02 LAB — HIV-1 RNA QUANT-NO REFLEX-BLD
HIV 1 RNA Quant: <20 copies/mL
HIV-1 RNA Quant, Log: <1.3 Log copies/mL

## 2018-11-02 LAB — COMPREHENSIVE METABOLIC PANEL
AG Ratio: 1.8 (calc) (ref 1.0–2.5)
ALT: 20 U/L (ref 6–29)
AST: 23 U/L (ref 10–35)
Albumin: 4.4 g/dL (ref 3.6–5.1)
Alkaline phosphatase (APISO): 184 U/L — ABNORMAL HIGH (ref 37–153)
BUN: 16 mg/dL (ref 7–25)
CO2: 30 mmol/L (ref 20–32)
Calcium: 9.4 mg/dL (ref 8.6–10.4)
Chloride: 105 mmol/L (ref 98–110)
Creat: 0.99 mg/dL (ref 0.50–1.05)
Globulin: 2.5 g/dL (calc) (ref 1.9–3.7)
Glucose, Bld: 123 mg/dL — ABNORMAL HIGH (ref 65–99)
Potassium: 3.7 mmol/L (ref 3.5–5.3)
Sodium: 142 mmol/L (ref 135–146)
Total Bilirubin: 0.4 mg/dL (ref 0.2–1.2)
Total Protein: 6.9 g/dL (ref 6.1–8.1)

## 2018-11-02 LAB — RPR: RPR Ser Ql: NONREACTIVE

## 2018-11-21 ENCOUNTER — Encounter: Payer: Self-pay | Admitting: Infectious Diseases

## 2018-11-21 ENCOUNTER — Other Ambulatory Visit (HOSPITAL_COMMUNITY)
Admission: RE | Admit: 2018-11-21 | Discharge: 2018-11-21 | Disposition: A | Discharge status: Home / Self Care | Payer: Medicare HMO | Source: Ambulatory Visit | Attending: Infectious Diseases | Admitting: Infectious Diseases

## 2018-11-21 ENCOUNTER — Ambulatory Visit (INDEPENDENT_AMBULATORY_CARE_PROVIDER_SITE_OTHER): Payer: Medicare HMO | Admitting: Infectious Diseases

## 2018-11-21 ENCOUNTER — Other Ambulatory Visit: Payer: Self-pay

## 2018-11-21 DIAGNOSIS — Z124 Encounter for screening for malignant neoplasm of cervix: Secondary | ICD-10-CM

## 2018-11-21 DIAGNOSIS — B20 Human immunodeficiency virus [HIV] disease: Secondary | ICD-10-CM

## 2018-11-21 DIAGNOSIS — Z113 Encounter for screening for infections with a predominantly sexual mode of transmission: Secondary | ICD-10-CM | POA: Diagnosis present

## 2018-11-21 DIAGNOSIS — Z1151 Encounter for screening for human papillomavirus (HPV): Secondary | ICD-10-CM | POA: Diagnosis not present

## 2018-11-21 DIAGNOSIS — Z01419 Encounter for gynecological examination (general) (routine) without abnormal findings: Secondary | ICD-10-CM | POA: Diagnosis not present

## 2018-11-21 NOTE — Progress Notes (Signed)
      Subjective:    Latoya Wilson is a 59 y.o. female here for an annual pelvic exam and pap smear.   Review of Systems: Current GYN complaints or concerns: none. Worried about COVID-19. Sexually active with one female partner. No longer menstruating.  Patient denies any abdominal/pelvic pain, problems with bowel movements, urination, vaginal discharge or intercourse.   Past Medical History:  Diagnosis Date  . Bipolar affective disorder ([place])   . HIV infection ([place])   . Hyperlipidemia   . Hypertension   . Narcolepsy   . Neuropathy   . Restless leg syndrome   . Seizures ([place])   . Sleep apnea     Gynecologic History: [id number]  No LMP recorded. (Menstrual status: Perimenopausal). Contraception: post menopausal status Last Pap: 09/2016. Results were: normal Anal Intercourse:  Last Mammogram: scheduled 10/2018.   Objective:  Physical Exam  Constitutional: Well developed, well nourished, no acute distress. She is alert and oriented x3.  Pelvic: External genitalia is normal in appearance. The vagina is normal in appearance. The cervix is bulbous and easily visualized. No CMT, normal expected cervical mucus present.  Psych: She has a normal mood and affect.    Assessment:  Normal pelvic/speculum exam Cervical cancer screening Breast cancer screening Well controlled HIV   Plan:  Health Maintenance =   Discussed recommended screening interval for women living with HIV disease. She would be a good candidate to increase interval to q59yr pending today's results which she is comfortable with.   Results will be communicated to the patient via phone call.  She has been counseled and instructed how to perform monthly self breast exams.   Screening mammogram scheduled.   Contraception / Family Planning =   Post-menopausal   HIV =   She will continue her Tivicay and Descovy and F/U as scheduled with Dr. Johnnye Sima for ongoing HIV care.   Janene Madeira, MSN, NP-C [facility] Office: [phone] Pager: [phone]  11/21/18 11:27 AM

## 2018-11-21 NOTE — Addendum Note (Signed)
Addended by: Dolan Amen D on: 11/21/2018 12:10 PM   Modules accepted: Orders

## 2018-11-22 ENCOUNTER — Other Ambulatory Visit: Payer: Self-pay | Admitting: Infectious Diseases

## 2018-11-22 ENCOUNTER — Ambulatory Visit
Admission: RE | Admit: 2018-11-22 | Discharge: 2018-11-22 | Disposition: A | Discharge status: Home / Self Care | Payer: Medicare HMO | Source: Ambulatory Visit | Attending: Infectious Diseases | Admitting: Infectious Diseases

## 2018-11-22 DIAGNOSIS — B20 Human immunodeficiency virus [HIV] disease: Secondary | ICD-10-CM

## 2018-11-22 LAB — CYTOLOGY - PAP
Adequacy: ABSENT
Chlamydia: NEGATIVE
Diagnosis: NEGATIVE
HPV: NOT DETECTED
Neisseria Gonorrhea: NEGATIVE

## 2018-11-23 LAB — URINE CYTOLOGY ANCILLARY ONLY
Chlamydia: NEGATIVE
Neisseria Gonorrhea: NEGATIVE

## 2018-11-26 ENCOUNTER — Other Ambulatory Visit: Payer: Self-pay | Admitting: Infectious Diseases

## 2018-11-26 DIAGNOSIS — B20 Human immunodeficiency virus [HIV] disease: Secondary | ICD-10-CM

## 2018-11-26 NOTE — Progress Notes (Signed)
Please call Latoya Wilson to let her know that her pap smear results were normal and there is no concern for any cervical cancer or human papilloma virus (HPV). I would recommend her next screening be in 3 years.

## 2018-11-27 ENCOUNTER — Telehealth: Payer: Self-pay

## 2018-11-27 NOTE — Telephone Encounter (Signed)
Called [facility] to relay pap results per Janene Madeira, NP.   Olin Hauser verified idenity with two identifiers. Relayed normal pap results. Lashonya had no questions or concerns.    Lenore Cordia, [place]

## 2018-11-27 NOTE — Telephone Encounter (Signed)
-----   Message from Karnak Callas, NP sent at 11/26/2018  1:31 PM EDT ----- Please call Latoya Wilson to let her know that her pap smear results were normal and there is no concern for any cervical cancer or human papilloma virus (HPV). I would recommend her next screening be in 3 years.

## 2018-12-20 ENCOUNTER — Other Ambulatory Visit: Payer: Self-pay | Admitting: *Deleted

## 2018-12-20 DIAGNOSIS — B20 Human immunodeficiency virus [HIV] disease: Secondary | ICD-10-CM

## 2018-12-20 MED ORDER — DESCOVY 200-25 MG PO TABS: 1.0000 | ORAL_TABLET | Freq: Every day | ORAL | 5 refills | Status: DC

## 2019-06-04 ENCOUNTER — Other Ambulatory Visit: Payer: Self-pay | Admitting: *Deleted

## 2019-06-04 DIAGNOSIS — B20 Human immunodeficiency virus [HIV] disease: Secondary | ICD-10-CM

## 2019-06-04 MED ORDER — DESCOVY 200-25 MG PO TABS: 1.0000 | ORAL_TABLET | Freq: Every day | ORAL | 3 refills | Status: DC

## 2019-06-04 MED ORDER — TIVICAY 50 MG PO TABS: 50.0000 mg | ORAL_TABLET | Freq: Every day | ORAL | 3 refills | Status: DC

## 2019-06-20 ENCOUNTER — Other Ambulatory Visit: Payer: Self-pay

## 2019-06-20 ENCOUNTER — Other Ambulatory Visit (HOSPITAL_COMMUNITY)
Admission: RE | Admit: 2019-06-20 | Discharge: 2019-06-20 | Disposition: A | Discharge status: Home / Self Care | Payer: Medicare HMO | Source: Ambulatory Visit | Attending: Infectious Diseases | Admitting: Infectious Diseases

## 2019-06-20 ENCOUNTER — Other Ambulatory Visit: Payer: Self-pay | Admitting: Infectious Diseases

## 2019-06-20 ENCOUNTER — Other Ambulatory Visit: Payer: Medicare HMO

## 2019-06-20 DIAGNOSIS — Z79899 Other long term (current) drug therapy: Secondary | ICD-10-CM

## 2019-06-20 DIAGNOSIS — B20 Human immunodeficiency virus [HIV] disease: Secondary | ICD-10-CM

## 2019-06-20 DIAGNOSIS — Z113 Encounter for screening for infections with a predominantly sexual mode of transmission: Secondary | ICD-10-CM | POA: Diagnosis present

## 2019-06-20 LAB — T-HELPER CELL (CD4) - (RCID CLINIC ONLY)
CD4 % Helper T Cell: 38 % (ref 33–65)
CD4 T Cell Abs: 521 /uL (ref 400–1790)

## 2019-06-24 LAB — MOLECULAR ANCILLARY ONLY
Chlamydia: NEGATIVE
Comment: NEGATIVE
Comment: NORMAL
Neisseria Gonorrhea: NEGATIVE

## 2019-06-25 LAB — COMPREHENSIVE METABOLIC PANEL
AG Ratio: 1.6 (calc) (ref 1.0–2.5)
ALT: 20 U/L (ref 6–29)
AST: 24 U/L (ref 10–35)
Albumin: 4.2 g/dL (ref 3.6–5.1)
Alkaline phosphatase (APISO): 142 U/L (ref 37–153)
BUN: 14 mg/dL (ref 7–25)
CO2: 30 mmol/L (ref 20–32)
Calcium: 9.5 mg/dL (ref 8.6–10.4)
Chloride: 103 mmol/L (ref 98–110)
Creat: 1.03 mg/dL (ref 0.50–1.05)
Globulin: 2.6 g/dL (calc) (ref 1.9–3.7)
Glucose, Bld: 213 mg/dL — ABNORMAL HIGH (ref 65–99)
Potassium: 3.7 mmol/L (ref 3.5–5.3)
Sodium: 142 mmol/L (ref 135–146)
Total Bilirubin: 0.5 mg/dL (ref 0.2–1.2)
Total Protein: 6.8 g/dL (ref 6.1–8.1)

## 2019-06-25 LAB — LIPID PANEL
Cholesterol: 222 mg/dL — ABNORMAL HIGH (ref <200)
HDL: 54 mg/dL (ref >=50)
LDL Cholesterol (Calc): 138 mg/dL (calc) — ABNORMAL HIGH
Non-HDL Cholesterol (Calc): 168 mg/dL (calc) — ABNORMAL HIGH (ref <130)
Total CHOL/HDL Ratio: 4.1 (calc) (ref <5.0)
Triglycerides: 162 mg/dL — ABNORMAL HIGH (ref <150)

## 2019-06-25 LAB — CBC
HCT: 44.2 % (ref 35.0–45.0)
Hemoglobin: 14.9 g/dL (ref 11.7–15.5)
MCH: 30.7 pg (ref 27.0–33.0)
MCHC: 33.7 g/dL (ref 32.0–36.0)
MCV: 91.1 fL (ref 80.0–100.0)
MPV: 11.3 fL (ref 7.5–12.5)
Platelets: 222 10*3/uL (ref 140–400)
RBC: 4.85 10*6/uL (ref 3.80–5.10)
RDW: 11.9 % (ref 11.0–15.0)
WBC: 5.9 10*3/uL (ref 3.8–10.8)

## 2019-06-25 LAB — HIV-1 RNA QUANT-NO REFLEX-BLD
HIV 1 RNA Quant: <20 copies/mL
HIV-1 RNA Quant, Log: <1.3 Log copies/mL

## 2019-06-25 LAB — RPR: RPR Ser Ql: NONREACTIVE

## 2019-07-07 ENCOUNTER — Other Ambulatory Visit: Payer: Self-pay | Admitting: Infectious Diseases

## 2019-07-07 DIAGNOSIS — B20 Human immunodeficiency virus [HIV] disease: Secondary | ICD-10-CM

## 2019-07-08 ENCOUNTER — Encounter: Payer: Self-pay | Admitting: Infectious Diseases

## 2019-07-08 ENCOUNTER — Ambulatory Visit (INDEPENDENT_AMBULATORY_CARE_PROVIDER_SITE_OTHER): Payer: Medicare HMO | Admitting: Infectious Diseases

## 2019-07-08 ENCOUNTER — Other Ambulatory Visit: Payer: Self-pay

## 2019-07-08 DIAGNOSIS — I10 Essential (primary) hypertension: Secondary | ICD-10-CM | POA: Diagnosis not present

## 2019-07-08 DIAGNOSIS — E7800 Pure hypercholesterolemia, unspecified: Secondary | ICD-10-CM

## 2019-07-08 DIAGNOSIS — B20 Human immunodeficiency virus [HIV] disease: Secondary | ICD-10-CM

## 2019-07-08 NOTE — Assessment & Plan Note (Signed)
Is going to get COVID rx where she is in [place] Doing well with her art.  She will come in for in person visit in ~ 9 months when she returns to [facility].  Will be available prn until then.

## 2019-07-08 NOTE — Assessment & Plan Note (Signed)
New rx from her PCP.  Mod elevated at last visit.  Strong fhx of CAD.

## 2019-07-08 NOTE — Progress Notes (Signed)
   Subjective:    Patient ID: Latoya Wilson, female    DOB: Mar 30, 1961, 59 y.o.   MRN: [id number]  HPI 59yo F with hx of HIV+ (01-2007) (CD4 200 and HIV RNA 3640), HTN, bipolar d/o. Taking ATVr/TRV -->evotaz-/descovy (2016)(CD4 570, HIV RNA <20).  Smoking- 2-3 cig/day.  Has been smoking less since staying with family.  Exercising more. Wt down 20#.  Is staying with her mom, her brother died around 04/13/23 of CAD.  Continues to have back pain. Needs cain occasionally. Has been doing a lot of lifting.   HIV 1 RNA Quant (copies/mL)  Date Value  06/20/2019 <20 NOT DETECTED  10/29/2018 <20 NOT DETECTED  05/01/2018 176 (H)   CD4 T Cell Abs (/uL)  Date Value  06/20/2019 521  05/01/2018 720  03/07/2017 780   Her Glc was elevated. Had A1C done at PCP, was told she was ok. Told to stop drinking Dr pepper prior to lab draw.   Denies missed art.   Review of Systems Please see HPI. All other systems reviewed and negative.     Objective:   Physical Exam Phone visit.       Assessment & Plan:

## 2019-07-08 NOTE — Assessment & Plan Note (Signed)
BP 131/85.  Cr normal.  Continues on her anti-htn rx.

## 2019-07-16 ENCOUNTER — Other Ambulatory Visit: Payer: Medicare HMO

## 2019-08-01 ENCOUNTER — Encounter: Payer: Medicare HMO | Admitting: Infectious Diseases

## 2019-10-04 ENCOUNTER — Other Ambulatory Visit: Payer: Self-pay | Admitting: Infectious Diseases

## 2019-10-04 DIAGNOSIS — B20 Human immunodeficiency virus [HIV] disease: Secondary | ICD-10-CM

## 2019-10-05 ENCOUNTER — Other Ambulatory Visit: Payer: Self-pay | Admitting: Infectious Diseases

## 2019-10-05 DIAGNOSIS — B20 Human immunodeficiency virus [HIV] disease: Secondary | ICD-10-CM

## 2019-10-07 ENCOUNTER — Other Ambulatory Visit: Payer: Self-pay

## 2020-01-10 ENCOUNTER — Other Ambulatory Visit: Payer: Self-pay | Admitting: Infectious Diseases

## 2020-01-10 DIAGNOSIS — B20 Human immunodeficiency virus [HIV] disease: Secondary | ICD-10-CM

## 2020-01-12 ENCOUNTER — Other Ambulatory Visit: Payer: Self-pay | Admitting: Infectious Diseases

## 2020-01-12 ENCOUNTER — Other Ambulatory Visit: Payer: Self-pay

## 2020-01-12 DIAGNOSIS — B20 Human immunodeficiency virus [HIV] disease: Secondary | ICD-10-CM

## 2020-01-12 MED ORDER — TIVICAY 50 MG PO TABS: ORAL_TABLET | ORAL | 3 refills | Status: DC

## 2020-05-13 ENCOUNTER — Other Ambulatory Visit: Payer: Self-pay | Admitting: Infectious Diseases

## 2020-05-13 DIAGNOSIS — B20 Human immunodeficiency virus [HIV] disease: Secondary | ICD-10-CM

## 2020-06-12 ENCOUNTER — Other Ambulatory Visit: Payer: Self-pay | Admitting: Infectious Diseases

## 2020-06-12 DIAGNOSIS — B20 Human immunodeficiency virus [HIV] disease: Secondary | ICD-10-CM

## 2020-06-14 ENCOUNTER — Other Ambulatory Visit: Payer: Self-pay | Admitting: Infectious Diseases

## 2020-06-14 DIAGNOSIS — B20 Human immunodeficiency virus [HIV] disease: Secondary | ICD-10-CM

## 2020-07-08 IMAGING — MG DIGITAL SCREENING BILATERAL MAMMOGRAM WITH TOMO AND CAD
8 series · 8 of 24 positions shown · non-contrast
Comparison: Previous exam(s).

CLINICAL DATA: Screening.

EXAM:
DIGITAL SCREENING BILATERAL MAMMOGRAM WITH TOMO AND CAD

[L MLO synth-2D]
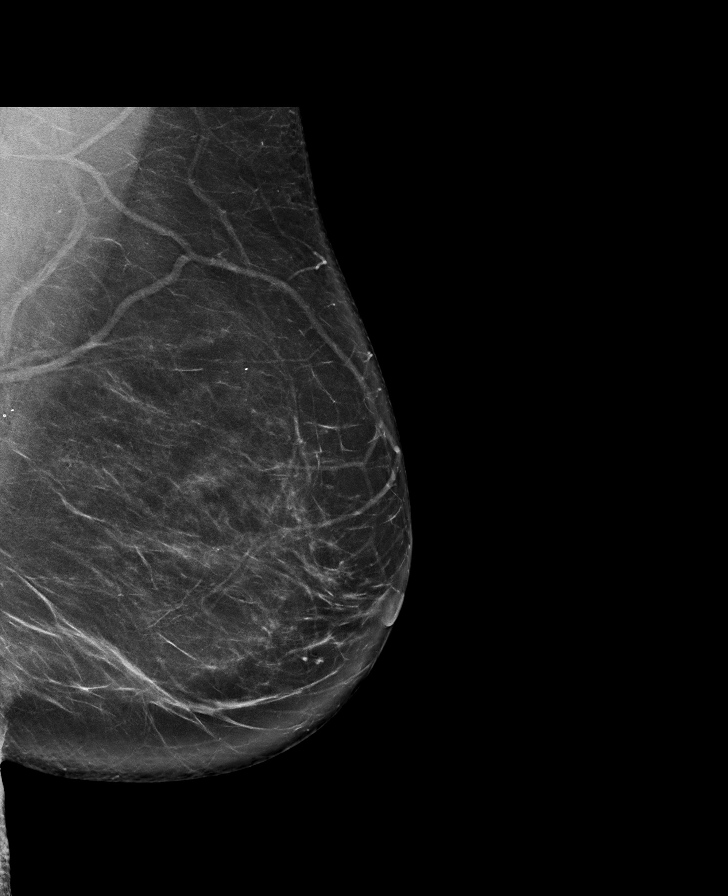

[R MLO synth-2D]
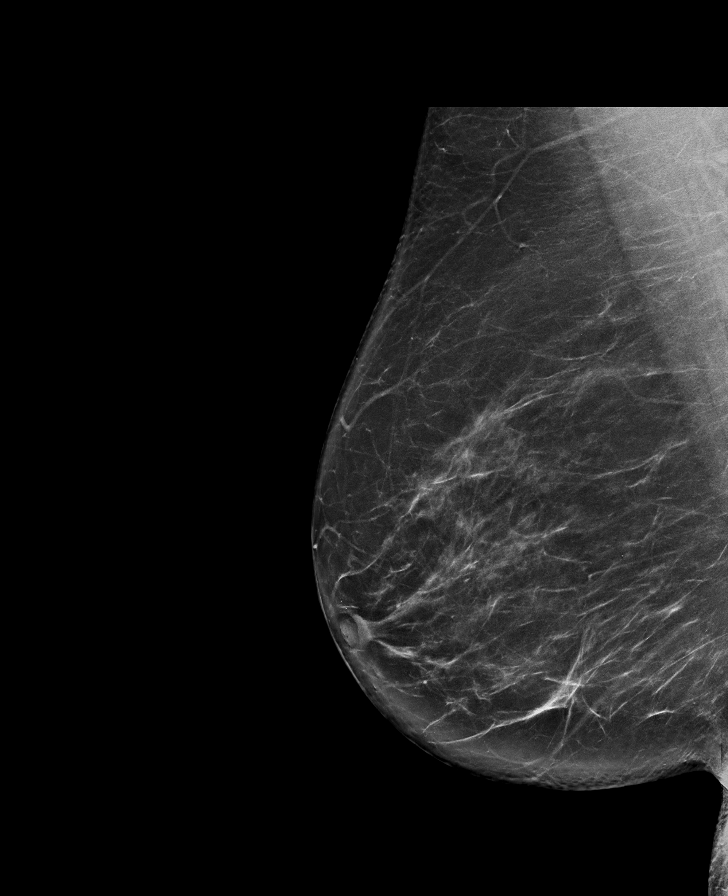

[L CC synth-2D]
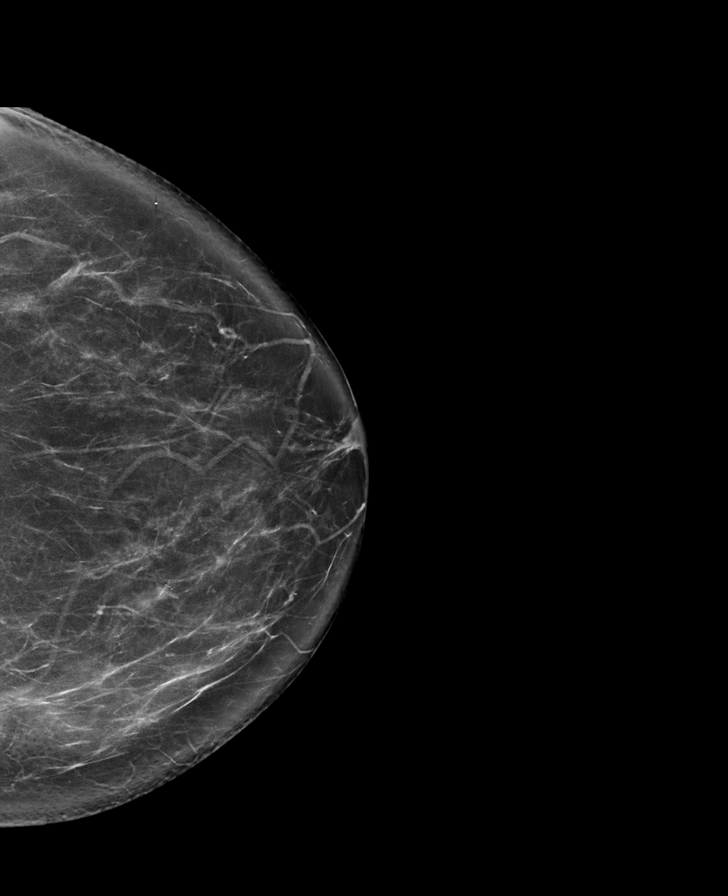

[R CC synth-2D]
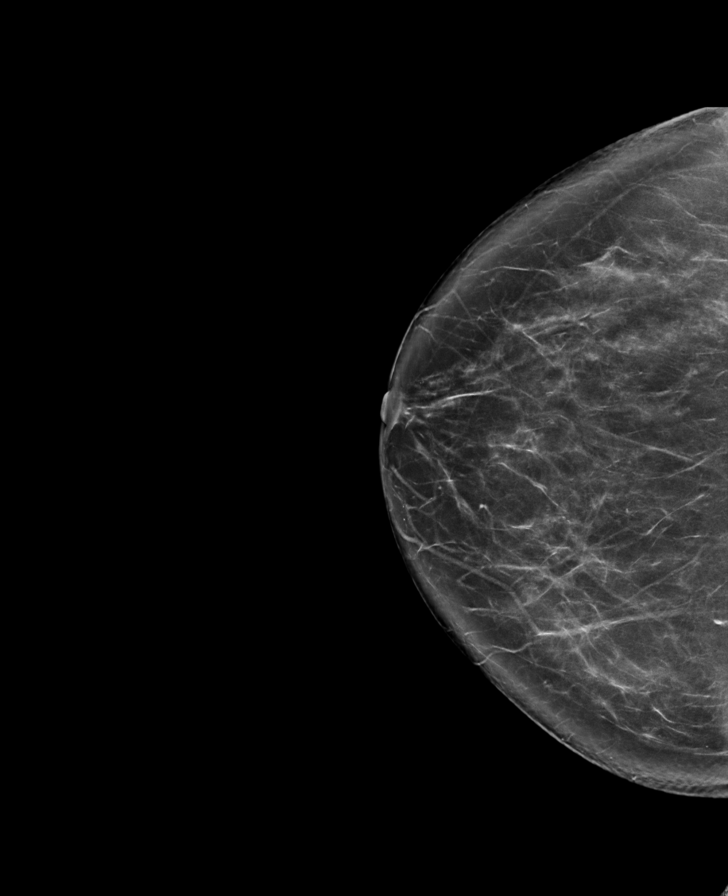

[R CC tomo · tomo slice 43/86.0]
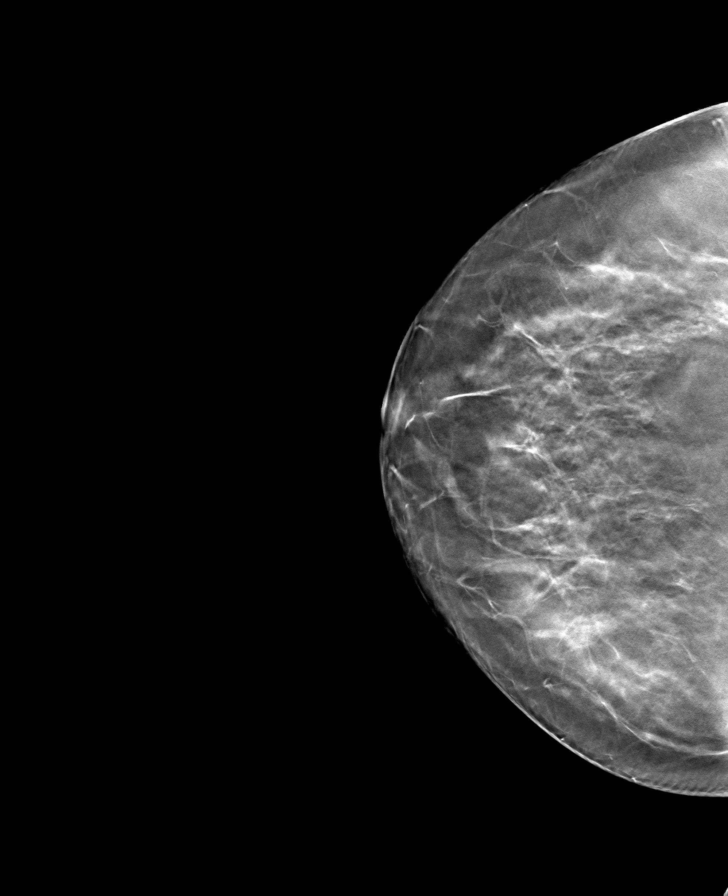

[L MLO tomo · tomo slice 46/91.0]
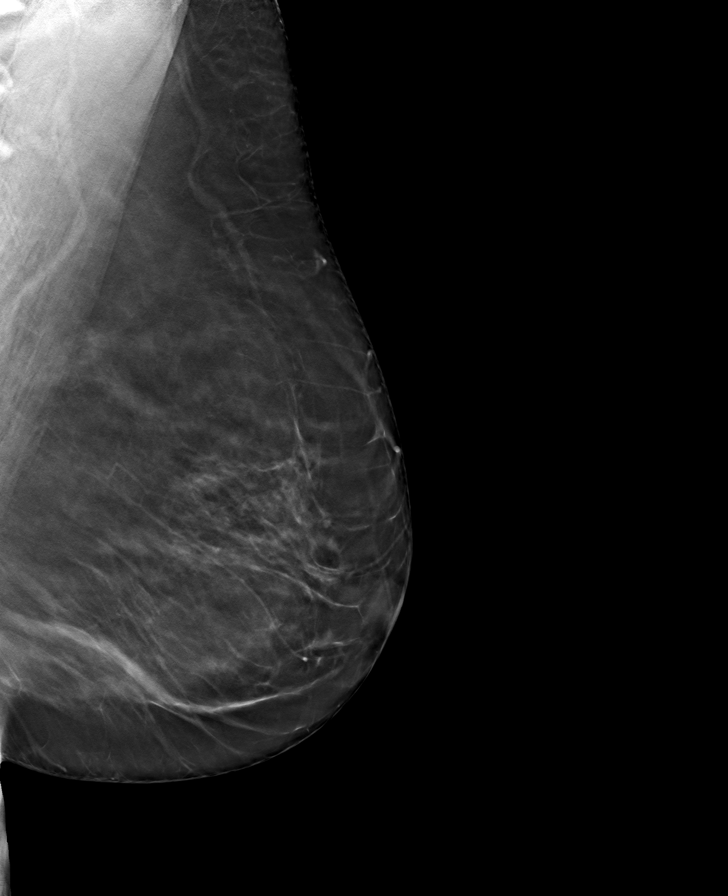

[L CC tomo · tomo slice 43/85.0]
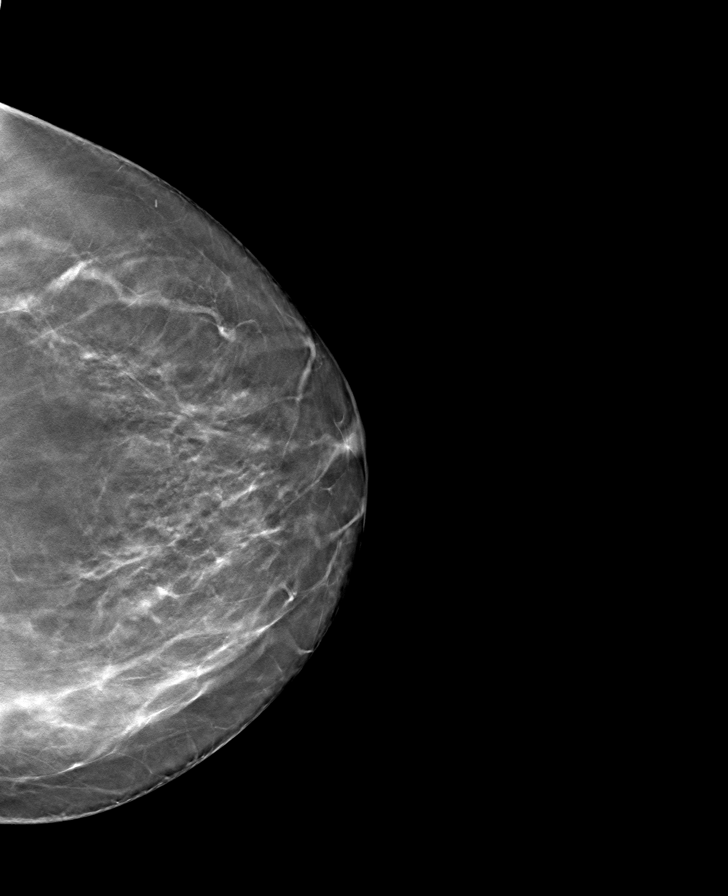

[R MLO tomo · tomo slice 46/91.0]
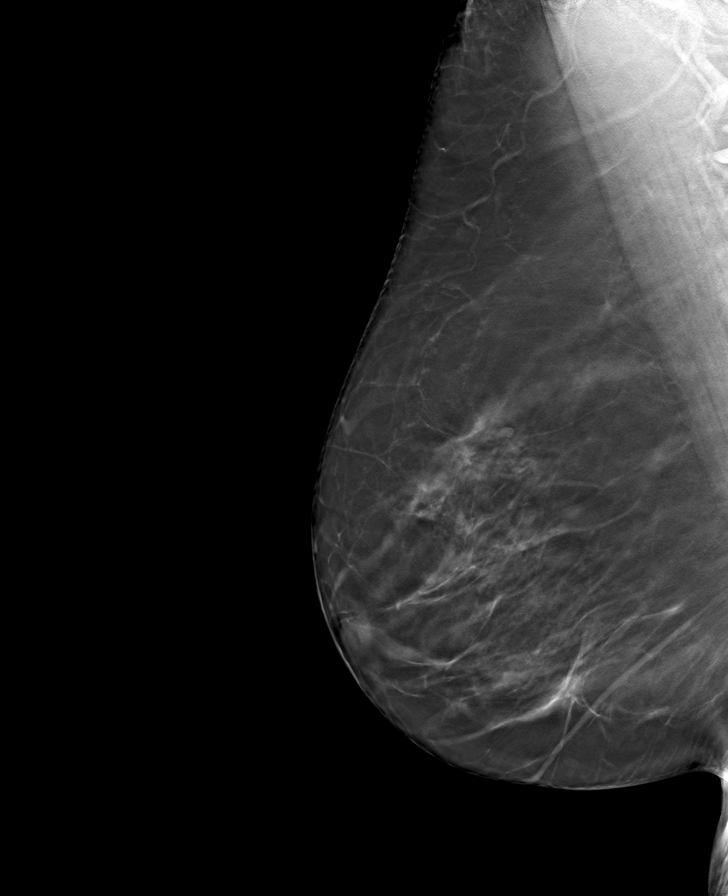

[8 of 24 positions shown; findings below may reference images not displayed]

ACR Breast Density Category b: There are scattered areas of
fibroglandular density.
FINDINGS: There are no findings suspicious for malignancy. Images were
processed with CAD.
IMPRESSION: No mammographic evidence of malignancy. A result letter of this
screening mammogram will be mailed directly to the patient.

RECOMMENDATION:
Screening mammogram in one year. (Code:[id number])

BI-RADS CATEGORY  1: Negative.

## 2020-07-19 ENCOUNTER — Other Ambulatory Visit: Payer: Self-pay | Admitting: Infectious Diseases

## 2020-07-19 DIAGNOSIS — B20 Human immunodeficiency virus [HIV] disease: Secondary | ICD-10-CM

## 2020-07-23 DIAGNOSIS — F17210 Nicotine dependence, cigarettes, uncomplicated: Secondary | ICD-10-CM

## 2020-07-23 DIAGNOSIS — Z122 Encounter for screening for malignant neoplasm of respiratory organs: Secondary | ICD-10-CM

## 2020-08-31 DIAGNOSIS — R059 Cough, unspecified: Secondary | ICD-10-CM

## 2020-11-03 ENCOUNTER — Encounter: Payer: Self-pay | Admitting: Internal Medicine

## 2020-11-05 DIAGNOSIS — R0600 Dyspnea, unspecified: Secondary | ICD-10-CM

## 2020-11-05 DIAGNOSIS — J9809 Other diseases of bronchus, not elsewhere classified: Secondary | ICD-10-CM

## 2021-01-07 DIAGNOSIS — G35 Multiple sclerosis: Secondary | ICD-10-CM

## 2021-01-07 DIAGNOSIS — M47812 Spondylosis without myelopathy or radiculopathy, cervical region: Secondary | ICD-10-CM

## 2021-04-28 ENCOUNTER — Telehealth: Payer: Self-pay

## 2021-04-28 NOTE — Telephone Encounter (Signed)
Called patient to offer appointment, she states she is living in [place] to take care of her mother and reports that she's in care. [facility] notified.   Beryle Flock, RN

## 2021-08-22 DIAGNOSIS — S4992XA Unspecified injury of left shoulder and upper arm, initial encounter: Secondary | ICD-10-CM

## 2021-09-12 ENCOUNTER — Encounter: Payer: Self-pay | Admitting: Infectious Diseases

## 2021-09-22 DIAGNOSIS — M8589 Other specified disorders of bone density and structure, multiple sites: Secondary | ICD-10-CM

## 2021-10-28 DIAGNOSIS — Z1231 Encounter for screening mammogram for malignant neoplasm of breast: Secondary | ICD-10-CM

## 2021-11-30 DIAGNOSIS — R918 Other nonspecific abnormal finding of lung field: Secondary | ICD-10-CM

## 2021-11-30 DIAGNOSIS — Z122 Encounter for screening for malignant neoplasm of respiratory organs: Secondary | ICD-10-CM

## 2021-11-30 DIAGNOSIS — F17210 Nicotine dependence, cigarettes, uncomplicated: Secondary | ICD-10-CM

## 2022-03-16 DIAGNOSIS — M19012 Primary osteoarthritis, left shoulder: Secondary | ICD-10-CM

## 2022-03-29 ENCOUNTER — Encounter: Payer: Self-pay | Admitting: Gastroenterology

## 2022-06-22 ENCOUNTER — Ambulatory Visit: Payer: Medicare (Managed Care)

## 2022-06-22 ENCOUNTER — Other Ambulatory Visit: Payer: Self-pay

## 2022-06-22 VITALS — BP 131/82 | HR 102 | Ht 65.0 in | Wt 170.0 lb

## 2022-06-22 DIAGNOSIS — S025XXA Fracture of tooth (traumatic), initial encounter for closed fracture: Secondary | ICD-10-CM

## 2022-06-22 NOTE — H&P (Signed)
Oral and Maxillofacial Surgery  Consult H&P Note      Chief Complaint   Patient presents with    Initial Evaluation     Extractions       HPI: Caitlyn Graham is a 62 y.o. female with a PMHx significant for self reported fibromyalgia, OSA, hypertension, hypercholesteremia, and HIV  who was recently found by a general dentist to have non-restorable teeth #4, 5, 6, 7, 8, 9, 10, 12. The patient presents today for evaluation of these teeth for extraction and to discuss anesthetic/sedation options.     Subjective:   Fever, chills, and any other s/s of systemic infection are denied at this time. Pain is currently rated as 0/10.    No past medical history on file.    No past surgical history on file.    Prior to Admission medications    Not on File          No current outpatient medications on file.     No current facility-administered medications for this visit.          Allergies:  Patient has no allergy information on record.    Social:       Family Hx:No family history on file.    VS: Blood pressure 131/82, pulse 102, height 1.651 m (5\' 5" ), weight 77.1 kg (170 lb), SpO2 97%.    Referral Source: Dr. Anda Latina [phone]         Review of Systems   As noted in HPI, otherwise 12 point ROS negative    Objective:              Physical Exam by Systems:   General: NAD, appears stated age  Lungs: CTAB  Cardiac: RRR  Neuro: AAOx3, no focal deficits     Extraoral Exam: No LAD, No TTP, TMJ WNL, no facial asymmetries noted.     Intraoral Exam: MIO 30 mm, FOM soft and nonelevated, OP clear and uvula at midline. Poor OH. No edema, erythema, purulence, or other signs of acute infection present. Teeth #5, 6, 7, 8, 9, 10, 12 are present. Retained roots #4 is present. #5 and #7 are fractured. Tori/buccal exostoses are present and indicated for removal.    Oral cancer screening performed.      Airway Exam:  Mallampati: 3  Neck mobility: FROM  MIO: 30 mm  Thyro-Mental Distance: 3 finger breaths  BMI:  28.29  Anesthesia/Intubation hx: Negative MH, PONV, difficult intubation    Images: Current Radiographs in [facility]  and dated 03/29/2022. Panoramic view reveals the following:  Findings:     TMJ: No obvious pathology, No hard tissue pathology           Teeth position / impaction: #4, 5, 6, 7, 8, 9, 10, 12      Assessment: Caitlyn Graham is a 62 y.o. female who presents for evaluation of teeth #4, 5, 6, 7, 8, 9, 10, 12 for extraction. Medical hx reviewed with pt and updated in chart. Medical hx significant for  fibromyalgia, OSA, hypertension, hypercholesteremia, and HIV.     Teeth #4, 5, 6, 7, 8, 9, 10, 12 are recommended for extraction by her primary dentist. Patient is interested in implants.  Alveoplasty and tori reduction will be performed to facilitate fabrication of an appropriately fitting prosthesis.     The procedure can be performed with LA, LA with nitrous, or under IV sedation. These options were discussed with the patient. It is recommended the procedure be  performed with LA and . The pt prefers to have the procedure performed under IV sedation, for which they are not an appropriate candidate given their overall health.     Pt was given form for PCP to send over updated MHX, meds, and lab work. Based on discussion with patient, there has been no clear plan restoratively. Pt stated that she would like a permanent prosthesis. Discussed case with Dr. Dareen Piano, Dr. Manual Meier, and Drenda Freeze. Plan is to have updated MHX, meds and lab work, call her primary dentist to determine restorative plan, finalize tx plan on Axium and to have financial discussion with pt once restorative plan is discussed with primary dentist.    At least 30 mins of time spent with patient during this visit.      Informed Consent:   I fully discussed the rationale, technical details, risks/benefits, and alternatives of surgery and anesthesia including no treatment as an option.    Preoperative and postoperative expectations were reviewed.   I  reviewed with patient the risks and alternatives of Local, N2O, IV Sedation, and General Anesthesia.  I fully discussed the following potential complications: Pain, Bleeding, Swelling, Bruising, Infection, Numbness, and Damage to Adjacent Structures as well as attempts to minimize these risks.  Questions were encouraged and answered to the patient's apparent satisfaction.   The patient was given preoperative information.    Surgical Plan:     1) Extraction of teeth #4, 5, 6, 7, 8, 9, 10, 12 once financial and restorative plans are finalized   2) Anesthesia: LA  3) Time required: 60 mins  4) Resident level: R4    All questions answered and pt left clinic in good condition.    This case was discussed with Dr. Dareen Piano and Dr. Fredric Dine, DMD  [facility] Resident

## 8386-12-03 DEATH — deceased
